# Patient Record
Sex: Male | Born: 1970 | Race: White | Hispanic: No | Marital: Single | Smoking: Former smoker
Health system: Southern US, Community
[De-identification: ages and names within clinical notes are randomized; demographics above are authoritative.]

## PROBLEM LIST (undated history)

## (undated) DIAGNOSIS — K219 Gastro-esophageal reflux disease without esophagitis: Secondary | ICD-10-CM

## (undated) DIAGNOSIS — E785 Hyperlipidemia, unspecified: Secondary | ICD-10-CM

## (undated) DIAGNOSIS — Z974 Presence of external hearing-aid: Secondary | ICD-10-CM

## (undated) DIAGNOSIS — J45909 Unspecified asthma, uncomplicated: Secondary | ICD-10-CM

## (undated) DIAGNOSIS — F319 Bipolar disorder, unspecified: Secondary | ICD-10-CM

## (undated) DIAGNOSIS — G4730 Sleep apnea, unspecified: Secondary | ICD-10-CM

## (undated) DIAGNOSIS — E119 Type 2 diabetes mellitus without complications: Secondary | ICD-10-CM

## (undated) DIAGNOSIS — M4802 Spinal stenosis, cervical region: Secondary | ICD-10-CM

## (undated) DIAGNOSIS — M5030 Other cervical disc degeneration, unspecified cervical region: Secondary | ICD-10-CM

## (undated) DIAGNOSIS — J302 Other seasonal allergic rhinitis: Secondary | ICD-10-CM

## (undated) DIAGNOSIS — R062 Wheezing: Secondary | ICD-10-CM

## (undated) DIAGNOSIS — I10 Essential (primary) hypertension: Secondary | ICD-10-CM

## (undated) DIAGNOSIS — F32A Depression, unspecified: Secondary | ICD-10-CM

## (undated) DIAGNOSIS — R519 Headache, unspecified: Secondary | ICD-10-CM

## (undated) DIAGNOSIS — E669 Obesity, unspecified: Secondary | ICD-10-CM

## (undated) DIAGNOSIS — K3184 Gastroparesis: Secondary | ICD-10-CM

## (undated) DIAGNOSIS — G4700 Insomnia, unspecified: Secondary | ICD-10-CM

## (undated) DIAGNOSIS — G43909 Migraine, unspecified, not intractable, without status migrainosus: Secondary | ICD-10-CM

## (undated) DIAGNOSIS — N529 Male erectile dysfunction, unspecified: Secondary | ICD-10-CM

## (undated) DIAGNOSIS — D649 Anemia, unspecified: Secondary | ICD-10-CM

## (undated) DIAGNOSIS — Z9689 Presence of other specified functional implants: Secondary | ICD-10-CM

## (undated) DIAGNOSIS — G8929 Other chronic pain: Secondary | ICD-10-CM

## (undated) DIAGNOSIS — M5412 Radiculopathy, cervical region: Secondary | ICD-10-CM

## (undated) DIAGNOSIS — K2950 Unspecified chronic gastritis without bleeding: Secondary | ICD-10-CM

## (undated) DIAGNOSIS — Z87891 Personal history of nicotine dependence: Secondary | ICD-10-CM

## (undated) DIAGNOSIS — F329 Major depressive disorder, single episode, unspecified: Secondary | ICD-10-CM

## (undated) DIAGNOSIS — Z860100 Personal history of colon polyps, unspecified: Secondary | ICD-10-CM

## (undated) HISTORY — PX: PERIPHERAL NERVE STIMULATOR: SHX7389

## (undated) HISTORY — PX: COLONOSCOPY W/ BIOPSIES: SHX1374

## (undated) HISTORY — DX: Type 2 diabetes mellitus without complications: E11.9

## (undated) HISTORY — DX: Bipolar disorder, unspecified: F31.9

## (undated) HISTORY — PX: FRACTURE SURGERY: SHX138

## (undated) HISTORY — PX: CHOLECYSTECTOMY: SHX55

## (undated) HISTORY — PX: OTHER SURGICAL HISTORY: SHX169

---
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
## 2007-12-15 ENCOUNTER — Emergency Department: Payer: Self-pay | Admitting: Emergency Medicine

## 2008-01-22 ENCOUNTER — Emergency Department: Payer: Self-pay | Admitting: Emergency Medicine

## 2008-08-10 ENCOUNTER — Emergency Department: Payer: Self-pay | Admitting: Emergency Medicine

## 2008-08-22 ENCOUNTER — Emergency Department: Payer: Self-pay | Admitting: Emergency Medicine

## 2008-09-20 ENCOUNTER — Emergency Department: Payer: Self-pay | Admitting: Emergency Medicine

## 2010-02-06 ENCOUNTER — Emergency Department: Payer: Self-pay

## 2012-09-10 DIAGNOSIS — G8929 Other chronic pain: Secondary | ICD-10-CM | POA: Insufficient documentation

## 2012-11-01 ENCOUNTER — Ambulatory Visit: Payer: Self-pay | Admitting: Orthopedic Surgery

## 2012-11-09 DIAGNOSIS — M2419 Other articular cartilage disorders, other specified site: Secondary | ICD-10-CM | POA: Insufficient documentation

## 2013-03-14 DIAGNOSIS — M25562 Pain in left knee: Secondary | ICD-10-CM | POA: Insufficient documentation

## 2013-03-14 DIAGNOSIS — M25469 Effusion, unspecified knee: Secondary | ICD-10-CM | POA: Insufficient documentation

## 2013-06-13 ENCOUNTER — Emergency Department: Payer: Self-pay | Admitting: Emergency Medicine

## 2013-10-30 DIAGNOSIS — E119 Type 2 diabetes mellitus without complications: Secondary | ICD-10-CM | POA: Insufficient documentation

## 2014-12-23 ENCOUNTER — Emergency Department: Payer: Self-pay | Admitting: Emergency Medicine

## 2015-01-02 DIAGNOSIS — M5416 Radiculopathy, lumbar region: Secondary | ICD-10-CM | POA: Insufficient documentation

## 2015-01-02 DIAGNOSIS — M5136 Other intervertebral disc degeneration, lumbar region: Secondary | ICD-10-CM | POA: Insufficient documentation

## 2015-03-14 DIAGNOSIS — M47816 Spondylosis without myelopathy or radiculopathy, lumbar region: Secondary | ICD-10-CM | POA: Insufficient documentation

## 2015-04-04 DIAGNOSIS — M5126 Other intervertebral disc displacement, lumbar region: Secondary | ICD-10-CM | POA: Insufficient documentation

## 2015-05-01 DIAGNOSIS — N529 Male erectile dysfunction, unspecified: Secondary | ICD-10-CM | POA: Insufficient documentation

## 2016-06-17 ENCOUNTER — Ambulatory Visit: Payer: Medicare Other | Attending: Anesthesiology | Admitting: Anesthesiology

## 2016-06-17 ENCOUNTER — Encounter: Payer: Self-pay | Admitting: Anesthesiology

## 2016-06-17 DIAGNOSIS — Z029 Encounter for administrative examinations, unspecified: Secondary | ICD-10-CM | POA: Diagnosis not present

## 2016-06-17 NOTE — Progress Notes (Unsigned)
Safety precautions to be maintained throughout the outpatient stay will include: orient to surroundings, keep bed in low position, maintain call bell within reach at all times, provide assistance with transfer out of bed and ambulation Primary care Leim FabryBarbara Aldridge, Dr Delf[facility]ins [facility] does injection and procedures, Dr Vear Clockhristopher Gaines- ortho MD- Filing for Workers comp.

## 2016-07-27 ENCOUNTER — Emergency Department: Payer: Medicare Other

## 2016-07-27 ENCOUNTER — Emergency Department
Admission: EM | Admit: 2016-07-27 | Discharge: 2016-07-27 | Disposition: A | Discharge status: Home / Self Care | Payer: Medicare Other | Attending: Emergency Medicine | Admitting: Emergency Medicine

## 2016-07-27 DIAGNOSIS — E119 Type 2 diabetes mellitus without complications: Secondary | ICD-10-CM | POA: Diagnosis not present

## 2016-07-27 DIAGNOSIS — Z7984 Long term (current) use of oral hypoglycemic drugs: Secondary | ICD-10-CM | POA: Insufficient documentation

## 2016-07-27 DIAGNOSIS — R0789 Other chest pain: Secondary | ICD-10-CM

## 2016-07-27 DIAGNOSIS — Z79899 Other long term (current) drug therapy: Secondary | ICD-10-CM | POA: Insufficient documentation

## 2016-07-27 DIAGNOSIS — Z791 Long term (current) use of non-steroidal anti-inflammatories (NSAID): Secondary | ICD-10-CM | POA: Insufficient documentation

## 2016-07-27 DIAGNOSIS — Z7982 Long term (current) use of aspirin: Secondary | ICD-10-CM | POA: Diagnosis not present

## 2016-07-27 DIAGNOSIS — Z87891 Personal history of nicotine dependence: Secondary | ICD-10-CM | POA: Diagnosis not present

## 2016-07-27 LAB — BASIC METABOLIC PANEL
ANION GAP: 6 (ref 5–15)
BUN: 24 mg/dL — ABNORMAL HIGH (ref 6–20)
CHLORIDE: 102 mmol/L (ref 101–111)
CO2: 31 mmol/L (ref 22–32)
CREATININE: 1.26 mg/dL — AB (ref 0.61–1.24)
Calcium: 9.9 mg/dL (ref 8.9–10.3)
GFR calc non Af Amer: >60 mL/min (ref >60)
Glucose, Bld: 130 mg/dL — ABNORMAL HIGH (ref 65–99)
POTASSIUM: 4 mmol/L (ref 3.5–5.1)
SODIUM: 139 mmol/L (ref 135–145)

## 2016-07-27 LAB — CBC
HCT: 41.4 % (ref 40.0–52.0)
HEMOGLOBIN: 14.1 g/dL (ref 13.0–18.0)
MCH: 28.1 pg (ref 26.0–34.0)
MCHC: 34 g/dL (ref 32.0–36.0)
MCV: 82.5 fL (ref 80.0–100.0)
PLATELETS: 234 10*3/uL (ref 150–440)
RBC: 5.02 MIL/uL (ref 4.40–5.90)
RDW: 12.6 % (ref 11.5–14.5)
WBC: 6.9 10*3/uL (ref 3.8–10.6)

## 2016-07-27 LAB — TROPONIN I
Troponin I: <0.03 ng/mL (ref <0.03)
Troponin I: <0.03 ng/mL (ref <0.03)

## 2016-07-27 NOTE — ED Triage Notes (Signed)
Pt c/o left sided chest pain that radiates into the left arm, that started last night, states it eased off and then came back this morning.. Denies SOB, N/V/diaporesis..Daniel [name]

## 2016-07-27 NOTE — ED Notes (Signed)
Pt. Verbalizes understanding of d/c instructions and follow-up. VS stable and pain controlled per pt.  Pt. In NAD at time of d/c and denies further concerns regarding this visit. Pt.Ambulatory Out of the unit with steady gait. Pt advised to return to the ED at any time for emergent concerns, or for new/worsening symptoms.

## 2016-07-27 NOTE — ED Provider Notes (Signed)
[facility] Emergency Department Provider Note ____________________________________________   I have reviewed the triage vital signs and the triage nursing note.  HISTORY  Chief Complaint Chest Pain   Historian Patient  HPI Daniel [name] is a 45 y.o. male with a history of diabetespresents today with 2 episodes of chest pain. The first one woke him up in the middle of the night around 5 AM, and then later around 7 AM when he woke up again. He took his brother to work around 8:30 and came over to the emergency room for evaluation. Pain is sharp and intermittent and over the left chest wall laterally and into the upper left shoulder. No nausea, trouble breathing, shortness of breath, fever, recent illnesses, weakness, numbness, or overuse injury. Had this before. Pain is essentially gone now. Patient denies burping or belching or indigestion symptoms. He does report that he is under some stress. He is a nonsmoker. He does not recall if he has a family history of heart attack, but thinks he may have had a grandmother with heart attacks.    Past Medical History:  Diagnosis Date  . Bipolar 1 disorder (HCC)   . Diabetes mellitus without complication (HCC)     There are no active problems to display for this patient.   Past Surgical History:  Procedure Laterality Date  . CHOLECYSTECTOMY      Prior to Admission medications   Medication Sig Start Date End Date Taking? Authorizing Provider  albuterol (PROVENTIL HFA;VENTOLIN HFA) 108 (90 Base) MCG/ACT inhaler Inhale 2 puffs into the lungs as needed for wheezing or shortness of breath.   Yes Historical Provider, MD  aspirin EC 81 MG tablet Take 81 mg by mouth at bedtime.    Yes Historical Provider, MD  atorvastatin (LIPITOR) 10 MG tablet Take 10 mg by mouth at bedtime.    Yes Historical Provider, MD  divalproex (DEPAKOTE ER) 500 MG 24 hr tablet Take 500 mg by mouth every morning.   Yes Historical Provider, MD   gabapentin (NEURONTIN) 300 MG capsule Take 600 mg by mouth 2 (two) times daily. Take 2 caps by mouth bid    Yes Historical Provider, MD  glucose blood test strip 1 each by Other route at bedtime. Use as instructed    Yes Historical Provider, MD  Melatonin 3 MG TABS Take 3 mg by mouth at bedtime.    Yes Historical Provider, MD  metFORMIN (GLUCOPHAGE-XR) 500 MG 24 hr tablet Take 1,500 mg by mouth every morning.   Yes Historical Provider, MD  naproxen (NAPROSYN) 500 MG tablet Take 500 mg by mouth 2 (two) times daily with a meal.   Yes Historical Provider, MD  traMADol (ULTRAM-ER) 100 MG 24 hr tablet Take 100 mg by mouth at bedtime as needed for pain.   Yes Historical Provider, MD    Allergies  Allergen Reactions  . Doxycycline     Other reaction(s): Vomiting  . Other     Other reaction(s): Unknown Mercaptobenzothiazole  . Parabens     Other reaction(s): Unknown  . Tramadol Itching    Tolerates extended release  . Penicillins Rash    Has patient had a PCN reaction causing immediate rash, facial/tongue/throat swelling, SOB or lightheadedness with hypotension: Yes Has patient had a PCN reaction causing severe rash involving mucus membranes or skin necrosis: No Has patient had a PCN reaction that required hospitalization No Has patient had a PCN reaction occurring within the last 10 years: No If all of  the above answers are "NO", then may proceed with Cephalosporin use.    Family History  Problem Relation Age of Onset  . Diabetes Mother   . Hypertension Mother   . Stroke Father     Social History Social History  Substance Use Topics  . Smoking status: Former Games developer  . Smokeless tobacco: Never Used  . Alcohol use No    Review of Systems  Constitutional: Negative for fever. Eyes: Negative for visual changes. ENT: Negative for sore throat. Cardiovascular:Negative for pleuritic chest pain. Respiratory: Negative for shortness of breath. Gastrointestinal: Negative for abdominal  pain, vomiting and diarrhea. Genitourinary: Negative for dysuria. Musculoskeletal: Negative for back pain. Skin: Negative for rash. Neurological: Negative for headache. 10 point Review of Systems otherwise negative ____________________________________________   PHYSICAL EXAM:  VITAL SIGNS: ED Triage Vitals [07/27/16 0733]  Enc Vitals Group     BP 139/88     Pulse Rate 67     Resp 17     Temp 98.1 F (36.7 C)     Temp Source Oral     SpO2 99 %     Weight 216 lb (98 kg)     Height 5\' 9"  (1.753 m)     Head Circumference      Peak Flow      Pain Score 9     Pain Loc      Pain Edu?      Excl. in GC?      Constitutional: Alert and oriented. Well appearing and in no distress. HEENT   Head: Normocephalic and atraumatic.      Eyes: Conjunctivae are normal. PERRL. Normal extraocular movements.      Ears:         Nose: No congestion/rhinnorhea.   Mouth/Throat: Mucous membranes are moist.   Neck: No stridor. Cardiovascular/Chest: Normal rate, regular rhythm.  No murmurs, rubs, or gallops. Respiratory: Normal respiratory effort without tachypnea nor retractions. Breath sounds are clear and equal bilaterally. No wheezes/rales/rhonchi. Gastrointestinal: Soft. No distention, no guarding, no rebound. Nontender.    Genitourinary/rectal:Deferred Musculoskeletal: Nontender with normal range of motion in all extremities. No joint effusions.  No lower extremity tenderness.  No edema. Neurologic:  Normal speech and language. No gross or focal neurologic deficits are appreciated. Skin:  Skin is warm, dry and intact. No rash noted. Psychiatric: Mood and affect are normal. Speech and behavior are normal. Patient exhibits appropriate insight and judgment.  ____________________________________________   EKG I, Governor Rooks, MD, the attending physician have personally viewed and interpreted all ECGs.  62 bpm. Normal sinus rhythm. Narrow QRS. Q waves inferiorly Normal axis. Normal ST  and T-wave. ____________________________________________  LABS (pertinent positives/negatives)  Labs Reviewed  BASIC METABOLIC PANEL - Abnormal; Notable for the following:       Result Value   Glucose, Bld 130 (*)    BUN 24 (*)    Creatinine, Ser 1.26 (*)    All other components within normal limits  CBC  TROPONIN I  TROPONIN I    ____________________________________________  RADIOLOGY All Xrays were viewed by me. Imaging interpreted by Radiologist.  Chest two-view: Calcified granuloma right upper lobe. No edema or consolidation. __________________________________________  PROCEDURES  Procedure(s) performed: None  Critical Care performed: None  ____________________________________________   ED COURSE / ASSESSMENT AND PLAN  Pertinent labs & imaging results that were available during my care of the patient were reviewed by me and considered in my medical decision making (see chart for details).   Mr. Desmith  is here with an atypical chest discomfort, 2 episodes this morning, but essentially resolved now. His EKG is reassuring. His troponin and repeat troponin are reassuring.  Uncertain etiology, but I suspect stress may be contributing. Patient will be referred for outpatient close cardiac follow-up.  Patient understands, to return for any worsening or associated conditions. He will call the cardiologist to make an appointment.    CONSULTATIONS:   None   Patient / Family / Caregiver informed of clinical course, medical decision-making process, and agree with plan.   I discussed return precautions, follow-up instructions, and discharged instructions with patient and/or family.   ___________________________________________   FINAL CLINICAL IMPRESSION(S) / ED DIAGNOSES   Final diagnoses:  Atypical chest pain              Note: This dictation was prepared with Dragon dictation. Any transcriptional errors that result from this process are  unintentional    Governor Rooksebecca Monterrio Gerst, MD 07/27/16 [phone]

## 2016-07-27 NOTE — Discharge Instructions (Signed)
You were evaluated for chest pain, and although no certain cause was found, your exam and evaluation are reassuring in the emergency department stay.  Return to the emergency department for any worsening chest pain, or certainly any associated trouble breathing, fever, weakness or numbness, dizziness or passing out, or any other symptoms concerning to you.

## 2016-07-27 NOTE — ED Notes (Signed)
Pt. Remains consistent with 9/10 chest pain upon my taking this pt. Assignment. WNL, pt. Laying in bed watching tv and on his phone with NAD noted. Pt. Updated on plan of care and instructed to please call out immediately for any change in symptoms or further needs.

## 2016-10-03 ENCOUNTER — Encounter: Payer: Self-pay | Admitting: Emergency Medicine

## 2016-10-03 ENCOUNTER — Emergency Department
Admission: EM | Admit: 2016-10-03 | Discharge: 2016-10-04 | Disposition: A | Discharge status: Home / Self Care | Payer: Medicare Other | Attending: Emergency Medicine | Admitting: Emergency Medicine

## 2016-10-03 DIAGNOSIS — Z7982 Long term (current) use of aspirin: Secondary | ICD-10-CM | POA: Insufficient documentation

## 2016-10-03 DIAGNOSIS — K529 Noninfective gastroenteritis and colitis, unspecified: Secondary | ICD-10-CM | POA: Diagnosis not present

## 2016-10-03 DIAGNOSIS — Z791 Long term (current) use of non-steroidal anti-inflammatories (NSAID): Secondary | ICD-10-CM | POA: Insufficient documentation

## 2016-10-03 DIAGNOSIS — E119 Type 2 diabetes mellitus without complications: Secondary | ICD-10-CM | POA: Insufficient documentation

## 2016-10-03 DIAGNOSIS — Z87891 Personal history of nicotine dependence: Secondary | ICD-10-CM | POA: Insufficient documentation

## 2016-10-03 DIAGNOSIS — Z79899 Other long term (current) drug therapy: Secondary | ICD-10-CM | POA: Insufficient documentation

## 2016-10-03 DIAGNOSIS — K047 Periapical abscess without sinus: Secondary | ICD-10-CM

## 2016-10-03 DIAGNOSIS — Z7984 Long term (current) use of oral hypoglycemic drugs: Secondary | ICD-10-CM | POA: Insufficient documentation

## 2016-10-03 DIAGNOSIS — R112 Nausea with vomiting, unspecified: Secondary | ICD-10-CM | POA: Diagnosis present

## 2016-10-03 LAB — BASIC METABOLIC PANEL
ANION GAP: 8 (ref 5–15)
BUN: 10 mg/dL (ref 6–20)
CALCIUM: 8.9 mg/dL (ref 8.9–10.3)
CO2: 30 mmol/L (ref 22–32)
CREATININE: 0.93 mg/dL (ref 0.61–1.24)
Chloride: 98 mmol/L — ABNORMAL LOW (ref 101–111)
GFR calc Af Amer: >60 mL/min (ref >60)
GLUCOSE: 161 mg/dL — AB (ref 65–99)
Potassium: 4.1 mmol/L (ref 3.5–5.1)
Sodium: 136 mmol/L (ref 135–145)

## 2016-10-03 LAB — CBC WITH DIFFERENTIAL/PLATELET
Basophils Absolute: 0 10*3/uL (ref 0–0.1)
Basophils Relative: 0 %
EOS PCT: 5 %
Eosinophils Absolute: 0.4 10*3/uL (ref 0–0.7)
HEMATOCRIT: 43.7 % (ref 40.0–52.0)
Hemoglobin: 14.4 g/dL (ref 13.0–18.0)
LYMPHS PCT: 18 %
Lymphs Abs: 1.5 10*3/uL (ref 1.0–3.6)
MCH: 27.5 pg (ref 26.0–34.0)
MCHC: 33.1 g/dL (ref 32.0–36.0)
MCV: 83.3 fL (ref 80.0–100.0)
MONO ABS: 0.8 10*3/uL (ref 0.2–1.0)
MONOS PCT: 10 %
NEUTROS ABS: 5.5 10*3/uL (ref 1.4–6.5)
Neutrophils Relative %: 67 %
PLATELETS: 243 10*3/uL (ref 150–440)
RBC: 5.25 MIL/uL (ref 4.40–5.90)
RDW: 13 % (ref 11.5–14.5)
WBC: 8.3 10*3/uL (ref 3.8–10.6)

## 2016-10-03 MED ORDER — BUPIVACAINE HCL (PF) 0.5 % IJ SOLN
10.0000 mL | Freq: Once | INTRAMUSCULAR | Status: AC
  Administered 2016-10-03: 10 mL
  Filled 2016-10-03: qty 30

## 2016-10-03 MED ORDER — ONDANSETRON HCL 4 MG/2ML IJ SOLN: 4.0000 mg | Freq: Once | INTRAMUSCULAR | Status: DC

## 2016-10-03 MED ORDER — ONDANSETRON HCL 4 MG PO TABS
4.0000 mg | ORAL_TABLET | Freq: Once | ORAL | Status: AC
  Administered 2016-10-03: 4 mg via ORAL
  Filled 2016-10-03: qty 1

## 2016-10-03 NOTE — ED Triage Notes (Signed)
Pt presents to ED with worsening abscessed tooth. Pt states he was seen by his dentist earlier in the week and dx with abscessed tooth and prescribed antibiotics. Pt reports increase in pain and swelling today. Called dentist and increased antibiotic dose. Pt now reports vomiting since around 2000 and radiating pain into the back of his neck.

## 2016-10-03 NOTE — ED Provider Notes (Signed)
[facility] Emergency Department Provider Note   ____________________________________________   I have reviewed the triage vital signs and the nursing notes.   HISTORY  Chief Complaint Oral Swelling and Emesis   History limited by: Not Limited   HPI Daniel [name] is a 45 y.o. male who presents to the emergency department today because of concern for continued left sided facial swelling and pain as well as nausea and vomiting. The patient states that he started having symptoms roughly 2 weeks ago. Was put on clindamycin 2 times a day. Went to urgent care today and they upped him to 4 times a day. He states that since leaving urgent care this morning he started developing nausea, vomiting and diarrhea. He denies any associated abdominal pain. States that he has not contacted his dentist about this problem yet.    Past Medical History:  Diagnosis Date  . Bipolar 1 disorder (HCC)   . Diabetes mellitus without complication (HCC)     There are no active problems to display for this patient.   Past Surgical History:  Procedure Laterality Date  . CHOLECYSTECTOMY      Prior to Admission medications   Medication Sig Start Date End Date Taking? Authorizing Provider  albuterol (PROVENTIL HFA;VENTOLIN HFA) 108 (90 Base) MCG/ACT inhaler Inhale 2 puffs into the lungs as needed for wheezing or shortness of breath.    Historical Provider, MD  aspirin EC 81 MG tablet Take 81 mg by mouth at bedtime.     Historical Provider, MD  atorvastatin (LIPITOR) 10 MG tablet Take 10 mg by mouth at bedtime.     Historical Provider, MD  divalproex (DEPAKOTE ER) 500 MG 24 hr tablet Take 500 mg by mouth every morning.    Historical Provider, MD  gabapentin (NEURONTIN) 300 MG capsule Take 600 mg by mouth 2 (two) times daily. Take 2 caps by mouth bid     Historical Provider, MD  glucose blood test strip 1 each by Other route at bedtime. Use as instructed     Historical Provider, MD   Melatonin 3 MG TABS Take 3 mg by mouth at bedtime.     Historical Provider, MD  metFORMIN (GLUCOPHAGE-XR) 500 MG 24 hr tablet Take 1,500 mg by mouth every morning.    Historical Provider, MD  naproxen (NAPROSYN) 500 MG tablet Take 500 mg by mouth 2 (two) times daily with a meal.    Historical Provider, MD  traMADol (ULTRAM-ER) 100 MG 24 hr tablet Take 100 mg by mouth at bedtime as needed for pain.    Historical Provider, MD    Allergies Doxycycline; Other; Parabens; Tramadol; and Penicillins  Family History  Problem Relation Age of Onset  . Diabetes Mother   . Hypertension Mother   . Stroke Father     Social History Social History  Substance Use Topics  . Smoking status: Former Games developermoker  . Smokeless tobacco: Never Used  . Alcohol use No    Review of Systems  Constitutional: Positive for fever. Cardiovascular: Negative for chest pain. Respiratory: Negative for shortness of breath. Gastrointestinal: Negative for abdominal pain, vomiting and diarrhea. Genitourinary: Negative for dysuria. Musculoskeletal: Negative for back pain. Skin: Negative for rash. Neurological: Negative for headaches, focal weakness or numbness.  10-point ROS otherwise negative.  ____________________________________________   PHYSICAL EXAM:  VITAL SIGNS: ED Triage Vitals [10/03/16 2200]  Enc Vitals Group     BP (!) 147/102     Pulse Rate 97     Resp  20     Temp 99.5 F (37.5 C)     Temp Source Oral     SpO2 97 %     Weight 216 lb (98 kg)     Height 5\' 9"  (1.753 m)     Head Circumference      Peak Flow      Pain Score 10   Constitutional: Alert and oriented. Well appearing and in no distress. Eyes: Conjunctivae are normal. Normal extraocular movements. ENT   Head: Normocephalic and atraumatic.   Nose: No congestion/rhinnorhea.   Mouth/Throat: Mucous membranes are moist. Some swelling around the left mandibular angle with some tenderness.    Neck: No  stridor. Hematological/Lymphatic/Immunilogical: No cervical lymphadenopathy. Cardiovascular: Normal rate, regular rhythm.  No murmurs, rubs, or gallops.  Respiratory: Normal respiratory effort without tachypnea nor retractions. Breath sounds are clear and equal bilaterally. No wheezes/rales/rhonchi. Gastrointestinal: Soft and nontender. No distention.  Genitourinary: Deferred Musculoskeletal: Normal range of motion in all extremities. No lower extremity edema. Neurologic:  Normal speech and language. No gross focal neurologic deficits are appreciated.  Skin:  Skin is warm, dry and intact. No rash noted. Psychiatric: Mood and affect are normal. Speech and behavior are normal. Patient exhibits appropriate insight and judgment.  ____________________________________________    LABS (pertinent positives/negatives)  Labs Reviewed  BASIC METABOLIC PANEL - Abnormal; Notable for the following:       Result Value   Chloride 98 (*)    Glucose, Bld 161 (*)    All other components within normal limits  CBC WITH DIFFERENTIAL/PLATELET    ____________________________________________   EKG  None  ____________________________________________    RADIOLOGY  None  ____________________________________________   PROCEDURES  Procedures  NERVE BLOCK Performed by: Phineas Semen Consent: Verbal consent obtained. Required items: required blood products, implants, devices, and special equipment available  Indication: Pain Nerve block body site: Left lower molar  Needle gauge: 25 G Location technique: anatomical landmarks  Local anesthetic: 0.5% bupivacaine  Anesthetic total: 1 ml  Outcome: pain improved Patient tolerance: Patient tolerated the procedure well with no immediate complications.  ____________________________________________   INITIAL IMPRESSION / ASSESSMENT AND PLAN / ED COURSE  Pertinent labs & imaging results that were available during my care of the patient  were reviewed by me and considered in my medical decision making (see chart for details).  Patient with continued left lower tooth pain as well as vomiting and diarrhea. Think patient continues to have dental infection, think had been under treated with abx, was switched to higher dose earlier. Performed dental block which patient tolerated well and stated helped his pain. In terms of N/V/D think likely gastroenteritis, no abdominal pain and exam is benign. Blood work without any concerning findings.  ____________________________________________   FINAL CLINICAL IMPRESSION(S) / ED DIAGNOSES  Final diagnoses:  Dental infection  Gastroenteritis     Note: This dictation was prepared with Dragon dictation. Any transcriptional errors that result from this process are unintentional    Phineas Semen, MD 10/04/16 0006

## 2016-10-04 MED ORDER — ONDANSETRON HCL 4 MG PO TABS: 4.0000 mg | ORAL_TABLET | Freq: Three times a day (TID) | ORAL | 0 refills | Status: DC | PRN

## 2016-10-04 NOTE — Discharge Instructions (Signed)
Please seek medical attention for any high fevers, chest pain, shortness of breath, change in behavior, persistent vomiting, bloody stool or any other new or concerning symptoms.  

## 2017-05-06 ENCOUNTER — Encounter: Payer: Self-pay | Admitting: Anesthesiology

## 2017-08-11 ENCOUNTER — Other Ambulatory Visit: Payer: Self-pay | Admitting: Orthopedic Surgery

## 2017-08-11 DIAGNOSIS — M7551 Bursitis of right shoulder: Secondary | ICD-10-CM

## 2017-08-11 DIAGNOSIS — M25311 Other instability, right shoulder: Secondary | ICD-10-CM

## 2017-08-21 ENCOUNTER — Ambulatory Visit
Admission: RE | Admit: 2017-08-21 | Discharge: 2017-08-21 | Disposition: A | Discharge status: Home / Self Care | Payer: Medicare HMO | Source: Ambulatory Visit | Attending: Orthopedic Surgery | Admitting: Orthopedic Surgery

## 2017-08-21 DIAGNOSIS — M25311 Other instability, right shoulder: Secondary | ICD-10-CM | POA: Insufficient documentation

## 2017-08-21 DIAGNOSIS — R937 Abnormal findings on diagnostic imaging of other parts of musculoskeletal system: Secondary | ICD-10-CM | POA: Insufficient documentation

## 2017-08-21 DIAGNOSIS — M7551 Bursitis of right shoulder: Secondary | ICD-10-CM | POA: Diagnosis present

## 2017-09-09 DIAGNOSIS — M19011 Primary osteoarthritis, right shoulder: Secondary | ICD-10-CM | POA: Insufficient documentation

## 2017-09-09 DIAGNOSIS — M7581 Other shoulder lesions, right shoulder: Secondary | ICD-10-CM | POA: Insufficient documentation

## 2017-09-23 DIAGNOSIS — L219 Seborrheic dermatitis, unspecified: Secondary | ICD-10-CM | POA: Insufficient documentation

## 2017-10-05 ENCOUNTER — Encounter
Admission: RE | Admit: 2017-10-05 | Discharge: 2017-10-05 | Disposition: A | Discharge status: Home / Self Care | Payer: Medicare HMO | Source: Ambulatory Visit | Attending: Surgery | Admitting: Surgery

## 2017-10-05 DIAGNOSIS — Z01810 Encounter for preprocedural cardiovascular examination: Secondary | ICD-10-CM | POA: Diagnosis not present

## 2017-10-05 DIAGNOSIS — R9431 Abnormal electrocardiogram [ECG] [EKG]: Secondary | ICD-10-CM | POA: Diagnosis not present

## 2017-10-05 DIAGNOSIS — Z01812 Encounter for preprocedural laboratory examination: Secondary | ICD-10-CM | POA: Insufficient documentation

## 2017-10-05 HISTORY — DX: Wheezing: R06.2

## 2017-10-05 HISTORY — DX: Other seasonal allergic rhinitis: J30.2

## 2017-10-05 HISTORY — DX: Hyperlipidemia, unspecified: E78.5

## 2017-10-05 LAB — BASIC METABOLIC PANEL
ANION GAP: 6 (ref 5–15)
BUN: 16 mg/dL (ref 6–20)
CO2: 27 mmol/L (ref 22–32)
Calcium: 9.3 mg/dL (ref 8.9–10.3)
Chloride: 103 mmol/L (ref 101–111)
Creatinine, Ser: 0.92 mg/dL (ref 0.61–1.24)
GLUCOSE: 113 mg/dL — AB (ref 65–99)
POTASSIUM: 3.9 mmol/L (ref 3.5–5.1)
Sodium: 136 mmol/L (ref 135–145)

## 2017-10-05 LAB — CBC
HEMATOCRIT: 40.8 % (ref 40.0–52.0)
Hemoglobin: 13.5 g/dL (ref 13.0–18.0)
MCH: 27.3 pg (ref 26.0–34.0)
MCHC: 33.2 g/dL (ref 32.0–36.0)
MCV: 82.4 fL (ref 80.0–100.0)
Platelets: 245 10*3/uL (ref 150–440)
RBC: 4.95 MIL/uL (ref 4.40–5.90)
RDW: 12.7 % (ref 11.5–14.5)
WBC: 5.6 10*3/uL (ref 3.8–10.6)

## 2017-10-05 NOTE — Patient Instructions (Addendum)
Your procedure is scheduled on: 10/13/17 Tues Report to Same Day Surgery 2nd floor medical mall [facility](Medical Mall Entrance-take elevator on left to 2nd floor.  Check in with surgery information desk.) To find out your arrival time please call [phone] between 1PM - 3PM on 10/12/17 mon  Remember: Instructions that are not followed completely may result in serious medical risk, up to and including death, or upon the discretion of your surgeon and anesthesiologist your surgery may need to be rescheduled.    _x___ 1. Do not eat food after midnight the night before your procedure. You may drink clear liquids up to 2 hours before you are scheduled to arrive at the hospital for your procedure.  Do not drink clear liquids within 2 hours of your scheduled arrival to the hospital.  Clear liquids include  --Water or Apple juice without pulp  --Clear carbohydrate beverage such as ClearFast or Gatorade  --Black Coffee or Clear Tea (No milk, no creamers, do not add anything to                  the coffee or Tea Type 1 and type 2 diabetics should only drink water.  No gum chewing or hard candies.     __x__ 2. No Alcohol for 24 hours before or after surgery.   __x__3. No Smoking for 24 prior to surgery.   ____  4. Bring all medications with you on the day of surgery if instructed.    __x__ 5. Notify your doctor if there is any change in your medical condition     (cold, fever, infections).     Do not wear jewelry, make-up, hairpins, clips or nail polish.  Do not wear lotions, powders, or perfumes. You may wear deodorant.  Do not shave 48 hours prior to surgery. Men may shave face and neck.  Do not bring valuables to the hospital.    [facility] is not [facility] for any belongings or valuables.               Contacts, dentures or bridgework may not be worn into surgery.  Leave your suitcase in the car. After surgery it may be brought to your room.  For patients admitted to the hospital, discharge  time is determined by your                       treatment team.   Patients discharged the day of surgery will not be allowed to drive home.  You will need someone to drive you home and stay with you the night of your procedure.    Please read over the following fact sheets that you were given:   [facility] Preparing for Surgery and or MRSA Information   _x___ Take anti-hypertensive listed below, cardiac, seizure, asthma,     anti-reflux and psychiatric medicines. These include:  1. divalproex (DEPAKOTE ER) 500 MG 24 hr tablet  2.gabapentin (NEURONTIN) 300 MG capsule  3.  4.  5.  6.  ____Fleets enema or Magnesium Citrate as directed.   _x___ Use CHG Soap or sage wipes as directed on instruction sheet   ____ Use inhalers on the day of surgery and bring to hospital day of surgery  _x___ Stop Metformin and Janumet 2 days prior to surgery.   x ____ Take 1/2 of usual insulin dose the night before surgery and none on the morning     surgery.   _x___ Follow recommendations from Cardiologist, Pulmonologist  or PCP regarding          stopping Aspirin, Coumadin, Plavix ,Eliquis, Effient, or Pradaxa, and Pletal.  X____Stop Anti-inflammatories such as Advil, Aleve, Ibuprofen, Motrin, Naproxen, Naprosyn, Goodies powders or aspirin products. OK to take Tylenol and                          Celebrex.   _x___ Stop supplements until after surgery.  But may continue Vitamin D, Vitamin B,       and multivitamin.   ____ Bring C-Pap to the hospital.

## 2017-10-12 MED ORDER — CLINDAMYCIN PHOSPHATE 900 MG/50ML IV SOLN
900.0000 mg | Freq: Once | INTRAVENOUS | Status: AC
  Administered 2017-10-13: 900 mg via INTRAVENOUS

## 2017-10-13 ENCOUNTER — Ambulatory Visit: Payer: Medicare HMO | Admitting: Anesthesiology

## 2017-10-13 ENCOUNTER — Encounter
Admission: RE | Disposition: A | Discharge status: Home / Self Care | Payer: Self-pay | Source: Ambulatory Visit | Attending: Surgery

## 2017-10-13 ENCOUNTER — Ambulatory Visit
Admission: RE | Admit: 2017-10-13 | Discharge: 2017-10-13 | Disposition: A | Discharge status: Home / Self Care | Payer: Medicare HMO | Source: Ambulatory Visit | Attending: Surgery | Admitting: Surgery

## 2017-10-13 ENCOUNTER — Encounter: Payer: Self-pay | Admitting: *Deleted

## 2017-10-13 DIAGNOSIS — G4700 Insomnia, unspecified: Secondary | ICD-10-CM | POA: Insufficient documentation

## 2017-10-13 DIAGNOSIS — Z833 Family history of diabetes mellitus: Secondary | ICD-10-CM | POA: Insufficient documentation

## 2017-10-13 DIAGNOSIS — Z8042 Family history of malignant neoplasm of prostate: Secondary | ICD-10-CM | POA: Diagnosis not present

## 2017-10-13 DIAGNOSIS — E119 Type 2 diabetes mellitus without complications: Secondary | ICD-10-CM | POA: Diagnosis not present

## 2017-10-13 DIAGNOSIS — Z7984 Long term (current) use of oral hypoglycemic drugs: Secondary | ICD-10-CM | POA: Insufficient documentation

## 2017-10-13 DIAGNOSIS — Z880 Allergy status to penicillin: Secondary | ICD-10-CM | POA: Insufficient documentation

## 2017-10-13 DIAGNOSIS — Z888 Allergy status to other drugs, medicaments and biological substances status: Secondary | ICD-10-CM | POA: Insufficient documentation

## 2017-10-13 DIAGNOSIS — Z87891 Personal history of nicotine dependence: Secondary | ICD-10-CM | POA: Insufficient documentation

## 2017-10-13 DIAGNOSIS — Z823 Family history of stroke: Secondary | ICD-10-CM | POA: Diagnosis not present

## 2017-10-13 DIAGNOSIS — Z820 Family history of epilepsy and other diseases of the nervous system: Secondary | ICD-10-CM | POA: Diagnosis not present

## 2017-10-13 DIAGNOSIS — Z881 Allergy status to other antibiotic agents status: Secondary | ICD-10-CM | POA: Insufficient documentation

## 2017-10-13 DIAGNOSIS — E785 Hyperlipidemia, unspecified: Secondary | ICD-10-CM | POA: Insufficient documentation

## 2017-10-13 DIAGNOSIS — M549 Dorsalgia, unspecified: Secondary | ICD-10-CM | POA: Insufficient documentation

## 2017-10-13 DIAGNOSIS — G8929 Other chronic pain: Secondary | ICD-10-CM | POA: Insufficient documentation

## 2017-10-13 DIAGNOSIS — Z8262 Family history of osteoporosis: Secondary | ICD-10-CM | POA: Insufficient documentation

## 2017-10-13 DIAGNOSIS — M7581 Other shoulder lesions, right shoulder: Secondary | ICD-10-CM | POA: Diagnosis not present

## 2017-10-13 DIAGNOSIS — Z8249 Family history of ischemic heart disease and other diseases of the circulatory system: Secondary | ICD-10-CM | POA: Diagnosis not present

## 2017-10-13 DIAGNOSIS — Z8349 Family history of other endocrine, nutritional and metabolic diseases: Secondary | ICD-10-CM | POA: Diagnosis not present

## 2017-10-13 DIAGNOSIS — Z9049 Acquired absence of other specified parts of digestive tract: Secondary | ICD-10-CM | POA: Insufficient documentation

## 2017-10-13 DIAGNOSIS — M75111 Incomplete rotator cuff tear or rupture of right shoulder, not specified as traumatic: Secondary | ICD-10-CM | POA: Insufficient documentation

## 2017-10-13 DIAGNOSIS — Z808 Family history of malignant neoplasm of other organs or systems: Secondary | ICD-10-CM | POA: Diagnosis not present

## 2017-10-13 DIAGNOSIS — Z79899 Other long term (current) drug therapy: Secondary | ICD-10-CM | POA: Insufficient documentation

## 2017-10-13 DIAGNOSIS — M5116 Intervertebral disc disorders with radiculopathy, lumbar region: Secondary | ICD-10-CM | POA: Diagnosis not present

## 2017-10-13 DIAGNOSIS — M19011 Primary osteoarthritis, right shoulder: Secondary | ICD-10-CM | POA: Insufficient documentation

## 2017-10-13 DIAGNOSIS — F3181 Bipolar II disorder: Secondary | ICD-10-CM | POA: Insufficient documentation

## 2017-10-13 DIAGNOSIS — K219 Gastro-esophageal reflux disease without esophagitis: Secondary | ICD-10-CM | POA: Diagnosis not present

## 2017-10-13 DIAGNOSIS — Z818 Family history of other mental and behavioral disorders: Secondary | ICD-10-CM | POA: Diagnosis not present

## 2017-10-13 DIAGNOSIS — J45909 Unspecified asthma, uncomplicated: Secondary | ICD-10-CM | POA: Insufficient documentation

## 2017-10-13 DIAGNOSIS — M7541 Impingement syndrome of right shoulder: Secondary | ICD-10-CM | POA: Diagnosis not present

## 2017-10-13 HISTORY — PX: SHOULDER ARTHROSCOPY WITH OPEN ROTATOR CUFF REPAIR: SHX6092

## 2017-10-13 HISTORY — PX: SHOULDER ARTHROSCOPY WITH DISTAL CLAVICLE RESECTION: SHX5675

## 2017-10-13 LAB — GLUCOSE, CAPILLARY: GLUCOSE-CAPILLARY: 118 mg/dL — AB (ref 65–99)

## 2017-10-13 SURGERY — ARTHROSCOPY, SHOULDER WITH REPAIR, ROTATOR CUFF, OPEN
Anesthesia: General | Site: Shoulder | Laterality: Right | Wound class: Clean

## 2017-10-13 MED ORDER — ROCURONIUM BROMIDE 50 MG/5ML IV SOLN
INTRAVENOUS | Status: AC
  Filled 2017-10-13: qty 1

## 2017-10-13 MED ORDER — MIDAZOLAM HCL 2 MG/2ML IJ SOLN
0.5000 mg | Freq: Once | INTRAMUSCULAR | Status: AC
  Administered 2017-10-13: 0.5 mg via INTRAVENOUS

## 2017-10-13 MED ORDER — ONDANSETRON HCL 4 MG/2ML IJ SOLN
INTRAMUSCULAR | Status: DC | PRN
  Administered 2017-10-13: 4 mg via INTRAVENOUS

## 2017-10-13 MED ORDER — SODIUM CHLORIDE 0.9 % IV SOLN
INTRAVENOUS | Status: DC
  Administered 2017-10-13: 12:00:00 via INTRAVENOUS

## 2017-10-13 MED ORDER — FENTANYL CITRATE (PF) 100 MCG/2ML IJ SOLN
50.0000 ug | Freq: Once | INTRAMUSCULAR | Status: AC
  Administered 2017-10-13: 50 ug via INTRAVENOUS

## 2017-10-13 MED ORDER — LIDOCAINE HCL (PF) 2 % IJ SOLN
INTRAMUSCULAR | Status: AC
  Filled 2017-10-13: qty 10

## 2017-10-13 MED ORDER — SUGAMMADEX SODIUM 200 MG/2ML IV SOLN
INTRAVENOUS | Status: AC
  Filled 2017-10-13: qty 2

## 2017-10-13 MED ORDER — SUGAMMADEX SODIUM 200 MG/2ML IV SOLN
INTRAVENOUS | Status: DC | PRN
  Administered 2017-10-13: 200 mg via INTRAVENOUS

## 2017-10-13 MED ORDER — ROPIVACAINE HCL 5 MG/ML IJ SOLN
INTRAMUSCULAR | Status: DC | PRN
  Administered 2017-10-13: 20 mL via PERINEURAL
  Administered 2017-10-13: 10 mL via PERINEURAL

## 2017-10-13 MED ORDER — FENTANYL CITRATE (PF) 100 MCG/2ML IJ SOLN
INTRAMUSCULAR | Status: DC | PRN
  Administered 2017-10-13: 150 ug via INTRAVENOUS

## 2017-10-13 MED ORDER — IPRATROPIUM-ALBUTEROL 0.5-2.5 (3) MG/3ML IN SOLN
RESPIRATORY_TRACT | Status: AC
  Administered 2017-10-13: 3 mL
  Filled 2017-10-13: qty 3

## 2017-10-13 MED ORDER — IPRATROPIUM-ALBUTEROL 0.5-2.5 (3) MG/3ML IN SOLN: 3.0000 mL | RESPIRATORY_TRACT | Status: DC

## 2017-10-13 MED ORDER — PROPOFOL 10 MG/ML IV BOLUS
INTRAVENOUS | Status: DC | PRN
  Administered 2017-10-13: 180 mg via INTRAVENOUS

## 2017-10-13 MED ORDER — ONDANSETRON HCL 4 MG/2ML IJ SOLN
INTRAMUSCULAR | Status: AC
  Filled 2017-10-13: qty 2

## 2017-10-13 MED ORDER — PROPOFOL 10 MG/ML IV BOLUS
INTRAVENOUS | Status: AC
  Filled 2017-10-13: qty 20

## 2017-10-13 MED ORDER — DEXAMETHASONE SODIUM PHOSPHATE 4 MG/ML IJ SOLN
INTRAMUSCULAR | Status: DC | PRN
  Administered 2017-10-13: 5 mg via INTRAVENOUS

## 2017-10-13 MED ORDER — FENTANYL CITRATE (PF) 250 MCG/5ML IJ SOLN
INTRAMUSCULAR | Status: AC
  Filled 2017-10-13: qty 5

## 2017-10-13 MED ORDER — OXYCODONE HCL 5 MG PO TABS: 5.0000 mg | ORAL_TABLET | ORAL | 0 refills | Status: DC | PRN

## 2017-10-13 MED ORDER — ACETAMINOPHEN 10 MG/ML IV SOLN
INTRAVENOUS | Status: AC
  Filled 2017-10-13: qty 100

## 2017-10-13 MED ORDER — MIDAZOLAM HCL 2 MG/2ML IJ SOLN: 1.0000 mg | Freq: Once | INTRAMUSCULAR | Status: DC

## 2017-10-13 MED ORDER — BUPIVACAINE-EPINEPHRINE 0.5% -1:200000 IJ SOLN
INTRAMUSCULAR | Status: DC | PRN
  Administered 2017-10-13: 30 mL

## 2017-10-13 MED ORDER — FAMOTIDINE 20 MG PO TABS
20.0000 mg | ORAL_TABLET | Freq: Once | ORAL | Status: AC
  Administered 2017-10-13: 20 mg via ORAL

## 2017-10-13 MED ORDER — OXYCODONE HCL 5 MG PO TABS: 5.0000 mg | ORAL_TABLET | Freq: Once | ORAL | Status: DC | PRN

## 2017-10-13 MED ORDER — ROCURONIUM BROMIDE 100 MG/10ML IV SOLN
INTRAVENOUS | Status: DC | PRN
  Administered 2017-10-13: 50 mg via INTRAVENOUS
  Administered 2017-10-13: 10 mg via INTRAVENOUS

## 2017-10-13 MED ORDER — LACTATED RINGERS IV SOLN
INTRAVENOUS | Status: DC | PRN
  Administered 2017-10-13: 2 mL

## 2017-10-13 MED ORDER — LIDOCAINE HCL (PF) 1 % IJ SOLN
INTRAMUSCULAR | Status: DC | PRN
  Administered 2017-10-13: 1 mL via INTRADERMAL

## 2017-10-13 MED ORDER — ACETAMINOPHEN 10 MG/ML IV SOLN
INTRAVENOUS | Status: DC | PRN
  Administered 2017-10-13: 1000 mg via INTRAVENOUS

## 2017-10-13 MED ORDER — FENTANYL CITRATE (PF) 100 MCG/2ML IJ SOLN: 25.0000 ug | INTRAMUSCULAR | Status: DC | PRN

## 2017-10-13 MED ORDER — LIDOCAINE HCL (CARDIAC) 20 MG/ML IV SOLN
INTRAVENOUS | Status: DC | PRN
  Administered 2017-10-13: 100 mg via INTRAVENOUS

## 2017-10-13 MED ORDER — PHENYLEPHRINE HCL 10 MG/ML IJ SOLN
INTRAVENOUS | Status: DC | PRN
  Administered 2017-10-13: 20 ug/min via INTRAVENOUS

## 2017-10-13 MED ORDER — OXYCODONE HCL 5 MG/5ML PO SOLN: 5.0000 mg | Freq: Once | ORAL | Status: DC | PRN

## 2017-10-13 SURGICAL SUPPLY — 47 items
ANCHOR JUGGERKNOT WTAP NDL 2.9 (Anchor) ×3 IMPLANT
ANCHOR SUT W/ ORTHOCORD (Anchor) ×3 IMPLANT
BIT DRILL JUGRKNT W/NDL BIT2.9 (DRILL) ×1 IMPLANT
BLADE FULL RADIUS 3.5 (BLADE) ×3 IMPLANT
BUR ACROMIONIZER 4.0 (BURR) IMPLANT
CANNULA SHAVER 8MMX76MM (CANNULA) ×3 IMPLANT
CHLORAPREP W/TINT 26ML (MISCELLANEOUS) ×3 IMPLANT
COVER MAYO STAND STRL (DRAPES) ×3 IMPLANT
DRAPE IMP U-DRAPE 54X76 (DRAPES) ×6 IMPLANT
DRILL JUGGERKNOT W/NDL BIT 2.9 (DRILL) ×3
DRSG OPSITE POSTOP 4X6 (GAUZE/BANDAGES/DRESSINGS) ×3 IMPLANT
DRSG OPSITE POSTOP 4X8 (GAUZE/BANDAGES/DRESSINGS) ×3 IMPLANT
ELECT REM PT RETURN 9FT ADLT (ELECTROSURGICAL) ×3
ELECTRODE REM PT RTRN 9FT ADLT (ELECTROSURGICAL) ×1 IMPLANT
GAUZE PETRO XEROFOAM 1X8 (MISCELLANEOUS) IMPLANT
GAUZE SPONGE 4X4 12PLY STRL (GAUZE/BANDAGES/DRESSINGS) IMPLANT
GLOVE BIO SURGEON STRL SZ7.5 (GLOVE) ×6 IMPLANT
GLOVE BIO SURGEON STRL SZ8 (GLOVE) ×6 IMPLANT
GLOVE BIOGEL PI IND STRL 8 (GLOVE) ×2 IMPLANT
GLOVE BIOGEL PI INDICATOR 8 (GLOVE) ×4
GLOVE INDICATOR 8.0 STRL GRN (GLOVE) ×3 IMPLANT
GOWN STRL REUS W/ TWL LRG LVL3 (GOWN DISPOSABLE) ×1 IMPLANT
GOWN STRL REUS W/ TWL XL LVL3 (GOWN DISPOSABLE) ×1 IMPLANT
GOWN STRL REUS W/TWL LRG LVL3 (GOWN DISPOSABLE) ×2
GOWN STRL REUS W/TWL XL LVL3 (GOWN DISPOSABLE) ×2
GRASPER SUT 15 45D LOW PRO (SUTURE) ×3 IMPLANT
IV LACTATED RINGER IRRG 3000ML (IV SOLUTION) ×4
IV LR IRRIG 3000ML ARTHROMATIC (IV SOLUTION) ×2 IMPLANT
MANIFOLD NEPTUNE II (INSTRUMENTS) ×3 IMPLANT
MASK FACE SPIDER DISP (MASK) ×3 IMPLANT
MAT BLUE FLOOR 46X72 FLO (MISCELLANEOUS) IMPLANT
NDL MAYO CATGUT SZ5 (NEEDLE)
NDL SUT 5 .5 CRC TPR PNT MAYO (NEEDLE) IMPLANT
NEEDLE REVERSE CUT 1/2 CRC (NEEDLE) IMPLANT
PACK ARTHROSCOPY SHOULDER (MISCELLANEOUS) ×3 IMPLANT
SLING ARM LRG DEEP (SOFTGOODS) IMPLANT
SLING ULTRA II LG (MISCELLANEOUS) ×3 IMPLANT
STAPLER SKIN PROX 35W (STAPLE) ×3 IMPLANT
STRAP SAFETY BODY (MISCELLANEOUS) ×3 IMPLANT
SUT ETHIBOND 0 MO6 C/R (SUTURE) ×3 IMPLANT
SUT VIC AB 2-0 CT1 27 (SUTURE) ×2
SUT VIC AB 2-0 CT1 TAPERPNT 27 (SUTURE) ×1 IMPLANT
TAPE MICROFOAM 4IN (TAPE) IMPLANT
TUBING ARTHRO INFLOW-ONLY STRL (TUBING) ×3 IMPLANT
TUBING CONNECTING 10 (TUBING) ×2 IMPLANT
TUBING CONNECTING 10' (TUBING) ×1
WAND HAND CNTRL MULTIVAC 90 (MISCELLANEOUS) ×3 IMPLANT

## 2017-10-13 NOTE — Anesthesia Preprocedure Evaluation (Signed)
Anesthesia Evaluation  Patient identified by MRN, date of birth, ID band Patient awake    Reviewed: Allergy & Precautions, H&P , NPO status , Patient's Chart, lab work & pertinent test results  History of Anesthesia Complications Negative for: history of anesthetic complications  Airway Mallampati: III  TM Distance: <3 FB Neck ROM: full    Dental  (+) Chipped, Caps, Poor Dentition   Pulmonary neg shortness of breath, asthma , former smoker,           Cardiovascular Exercise Tolerance: Good (-) angina(-) Past MI and (-) DOE negative cardio ROS       Neuro/Psych PSYCHIATRIC DISORDERS Bipolar Disorder negative neurological ROS     GI/Hepatic negative GI ROS, Neg liver ROS,   Endo/Other  diabetes, Type 2  Renal/GU      Musculoskeletal   Abdominal   Peds  Hematology negative hematology ROS (+)   Anesthesia Other Findings Past Medical History: No date: Bipolar 1 disorder (HCC) No date: Diabetes mellitus without complication (HCC) No date: Elevated lipids No date: Seasonal allergies No date: Wheezing  Past Surgical History: No date: CHOLECYSTECTOMY  BMI    Body Mass Index:  31.90 kg/m      Reproductive/Obstetrics negative OB ROS                             Anesthesia Physical Anesthesia Plan  ASA: III  Anesthesia Plan: General ETT   Post-op Pain Management: GA combined w/ Regional for post-op pain   Induction: Intravenous  PONV Risk Score and Plan: 1 and Ondansetron, Dexamethasone and Midazolam  Airway Management Planned: Oral ETT  Additional Equipment:   Intra-op Plan:   Post-operative Plan: Extubation in OR  Informed Consent: I have reviewed the patients History and Physical, chart, labs and discussed the procedure including the risks, benefits and alternatives for the proposed anesthesia with the patient or authorized representative who has indicated his/her  understanding and acceptance.   Dental Advisory Given  Plan Discussed with: Anesthesiologist, CRNA and Surgeon  Anesthesia Plan Comments: (Patient consented for risks of anesthesia including but not limited to:  - adverse reactions to medications - damage to teeth, lips or other oral mucosa - sore throat or hoarseness - Damage to heart, brain, lungs or loss of life  Patient voiced understanding.)        Anesthesia Quick Evaluation

## 2017-10-13 NOTE — Transfer of Care (Signed)
Immediate Anesthesia Transfer of Care Note  Patient: Daniel [name]  Procedure(s) Performed: Procedure(s): SHOULDER ARTHROSCOPY WITH OPEN ROTATOR CUFF REPAIR/ (Right) SHOULDER ARTHROSCOPY WITH DISTAL CLAVICLE RESECTION (Right)  Patient Location: PACU  Anesthesia Type:General  Level of Consciousness: sedated  Airway & Oxygen Therapy: Patient Spontanous Breathing and Patient connected to face mask oxygen  Post-op Assessment: Report given to RN and Post -op Vital signs reviewed and stable  Post vital signs: Reviewed and stable  Last Vitals:  Vitals:   10/13/17 1330 10/13/17 1627  BP: 131/89 (!) 147/82  Pulse: 83 (!) 101  Resp: 17 20  Temp:  36.8 C  SpO2: 100% 94%    Complications: No apparent anesthesia complications

## 2017-10-13 NOTE — Progress Notes (Signed)
  Report to OR RN Lawernce Keasonna Twine RN.

## 2017-10-13 NOTE — OR Nursing (Signed)
To PACU for block 1241 pm

## 2017-10-13 NOTE — Anesthesia Postprocedure Evaluation (Signed)
Anesthesia Post Note  Patient: Daniel [name]  Procedure(s) Performed: SHOULDER ARTHROSCOPY WITH OPEN ROTATOR CUFF REPAIR/ (Right Shoulder) SHOULDER ARTHROSCOPY WITH DISTAL CLAVICLE RESECTION (Right Shoulder)  Patient location during evaluation: PACU Anesthesia Type: General Level of consciousness: awake and alert and oriented Pain management: pain level controlled Vital Signs Assessment: post-procedure vital signs reviewed and stable Respiratory status: spontaneous breathing Cardiovascular status: blood pressure returned to baseline Anesthetic complications: no     Last Vitals:  Vitals:   10/13/17 1627 10/13/17 1642  BP: (!) 147/82 (!) 149/88  Pulse: (!) 101 100  Resp: 20 16  Temp: 36.8 C   SpO2: 94% 95%    Last Pain:  Vitals:   10/13/17 1627  TempSrc:   PainSc: 0-No pain                 Crystal Scarberry

## 2017-10-13 NOTE — H&P (Signed)
Paper H&P to be scanned into permanent record. H&P reviewed and patient re-examined. No changes. 

## 2017-10-13 NOTE — Anesthesia Procedure Notes (Signed)
Procedure Name: Intubation Date/Time: 10/13/2017 2:12 PM Performed by: Darrol JumpMarion, Izael, CRNA Pre-anesthesia Checklist: Patient identified, Emergency Drugs available, Suction available and Patient being monitored Patient Re-evaluated:Patient Re-evaluated prior to induction Oxygen Delivery Method: Circle system utilized Preoxygenation: Pre-oxygenation with 100% oxygen Induction Type: IV induction Laryngoscope Size: Miller and 4 Grade View: Grade I Tube type: Oral Tube size: 7.0 mm Number of attempts: 1 Placement Confirmation: ETT inserted through vocal cords under direct vision,  positive ETCO2 and breath sounds checked- equal and bilateral Secured at: 23 cm Tube secured with: Tape Dental Injury: Teeth and Oropharynx as per pre-operative assessment

## 2017-10-13 NOTE — Discharge Instructions (Addendum)
AMBULATORY SURGERY  DISCHARGE INSTRUCTIONS   1) The drugs that you were given will stay in your system until tomorrow so for the next 24 hours you should not:  A) Drive an automobile B) Make any legal decisions C) Drink any alcoholic beverage   2) You may resume regular meals tomorrow.  Today it is better to start with liquids and gradually work up to solid foods.  You may eat anything you prefer, but it is better to start with liquids, then soup and crackers, and gradually work up to solid foods.   3) Please notify your doctor immediately if you have any unusual bleeding, trouble breathing, redness and pain at the surgery site, drainage, fever, or pain not relieved by medication. 4)   5) Your post-operative visit with Dr.                                     is: Date:                        Time:    Please call to schedule your post-operative visit.  6) Additional Instructions:      Keep dressing dry and intact.  May shower after dressing changed on post-op day #4 (Saturday).  Cover staples with Band-Aids after drying off. Apply ice frequently to shoulder. Take naproxen 500 mg BID with meals for 7-10 days, then as necessary. Take oxycodone as prescribed when needed.  May supplement with ES Tylenol if necessary. Keep shoulder immobilizer on at all times except may remove for bathing purposes. Follow-up in 10-14 days or as scheduled.

## 2017-10-13 NOTE — Op Note (Signed)
10/13/2017  4:24 PM  Patient:   Daniel [name] A Karas  Pre-Op Diagnosis:   Impingement/tendinopathy with possible partial thickness rotator cuff tear and degenerative joint disease of AC joint, right shoulder.  Post-Op Diagnosis: Impingement/tendinopathy with partial-thickness rotator cuff tear, degenerative joint disease of AC joint, and biceps tendinopathy, right shoulder.  Procedure: Limited arthroscopic debridement, arthroscopic subacromial decompression, arthroscopic rotator cuff repair, arthroscopic excision of distal clavicle, and mini-open biceps tenodesis, right shoulder.  Anesthesia: General endotracheal with interscalene block placed preoperatively by the anesthesiologist.  Surgeon:   Maryagnes AmosJ. Jeffrey Aleni Andrus, MD  Assistant:   Linus OrnNicole Stevens, PA-S  Findings: As above.  There was a partial-thickness tear involving the superior insertional fibers of the subscapularis tendon, as well as a small partial-thickness articular surface tear involving the articular side of the anterior insertional fibers of the subscapularis tendon (less than 20% of the footprint involved).  The remainder of the rotator cuff was in satisfactory condition.  The biceps tendon itself demonstrated areas of tendinitis without tearing.  The labrum was in satisfactory condition, as were the articular surfaces of both the glenoid and humerus.  Complications: None  Fluids:   1000 cc  Estimated blood loss: 5 cc  Tourniquet time: None  Drains: None  Closure: Staples   Brief clinical note: The patient is a 46 year old male with a history of right shoulder pain. The patient's symptoms have progressed despite medications, activity modification, etc. The patient's history and examination are consistent with impingement/tendinopathy with a possible rotator cuff tear. These findings were confirmed by MRI scan, which also demonstrated significant arthropathy of the [facility] joint and increased bone marrow signal in  the distal clavicle. The patient presents at this time for definitive management of these shoulder symptoms.  Procedure: The patient underwent placement of an interscalene block by the anesthesiologist in the preoperative holding area before being brought into the operating room and lain in the supine position. The patient then underwent general endotracheal intubation and anesthesia before being repositioned in the beach chair position using the beach chair positioner. The right shoulder and upper extremity were prepped with ChloraPrep solution before being draped sterilely. Preoperative antibiotics were administered. A timeout was performed to confirm the proper surgical site before the expected portal sites and incision site were injected with 0.5% Sensorcaine with epinephrine. A posterior portal was created and the glenohumeral joint thoroughly inspected with the findings as described above. An anterior portal was created using an outside-in technique. The labrum and rotator cuff were further probed, again confirming the above-noted findings. The areas of rotator cuff fraying involving both the anterior insertional fibers of the supraspinatus tendon as well as the superior insertional fibers of the subscapularis tendon were debrided back to stable margins using the full-radius resector.  The biceps tendon was probed and found to have areas of inflammation ("lip sticking").  These findings, in conjunction with the partial thickness tear of the superior fibers of the subscapularis tendon, made us decide to proceed with a biceps tenodesis. Therefore, the ArthroCare wand was inserted and used to release the biceps tendon from its labral anchor.   A separate superolateral portal site was created using outside in technique before the subscapularis tendon tear was repaired using a single Mitek BioKnotless anchor placed to the anterior portal.  The repair was assessed and found to be stable both with external  rotation and to probing. The instruments were removed from the joint after suctioning the excess fluid.  The camera was repositioned through the posterior portal  into the subacromial space. A separate lateral portal was created using an outside-in technique. The 3.5 mm full-radius resector was introduced and used to perform a subtotal bursectomy. The ArthroCare wand was then inserted and used to remove the periosteal tissue off the undersurface of the anterior third of the acromion as well as to recess the coracoacromial ligament from its attachment along the anterior and lateral margins of the acromion. The 4.0 mm acromionizing bur was introduced and used to complete the decompression by removing the undersurface of the anterior third of the acromion.   The ArthroCare wand was reintroduced and used to remove the periosteum off the undersurface surface of the [facility] joint as well as off the undersurface of the distal clavicle before the full radius resector was used to remove the inferior portion of the distal clavicle.  The camera was repositioned in the lateral portal and the 4 mm acromionizer bur introduced to the anterior portal. Under arthroscopic visualization, the remainder of the distal clavicle was removed. A total of 10 mm of distal clavicle was removed. The full radius resector was reintroduced to remove any residual bony debris before the ArthroCare wand was reintroduced to obtain hemostasis. The camera was repositioned through the anterior portal to better visualize the adequacy of distal clavicle excision. Once this was deemed satisfactory, the instruments were removed from the subacromial space after suctioning the excess fluid.  An approximately 4 cm incision was made over the anterolateral aspect of the shoulder beginning at the anterolateral corner of the acromion and extending distally in line with the bicipital groove, incorporating the superolateral portal site. This incision was carried down  through the subcutaneous tissues to expose the deltoid fascia. The raphae between the anterior and middle thirds was identified and this plane developed to provide access into the subacromial space. Additional bursal tissues were debrided sharply using Metzenbaum scissors. The rotator cuff tear was carefully inspected. No bursal surface tears were identified.  The bicipital groove was identified by palpation and opened for 1-1.5 cm. The biceps tendon stump was retrieved through this defect. The floor of the bicipital groove was roughened with a curet before a single Biomet 2.9 mm JuggerKnot anchor was inserted. Both sets of sutures were passed through the biceps tendon and tied securely to effect the tenodesis. The bicipital sheath was reapproximated using two #0 Ethibond interrupted sutures, incorporating the biceps tendon to further reinforce the tenodesis.  The wound was copiously irrigated with sterile saline solution before the deltoid raphae was reapproximated using 2-0 Vicryl interrupted sutures. The subcutaneous tissues were closed in two layers using 2-0 Vicryl interrupted sutures before the skin was closed using staples. The portal sites also were closed using staples. A sterile bulky dressing was applied to the shoulder before the arm was placed into a shoulder immobilizer. The patient was then awakened, extubated, and returned to the recovery room in satisfactory condition after tolerating the procedure well.

## 2017-10-13 NOTE — Anesthesia Post-op Follow-up Note (Signed)
Anesthesia QCDR form completed.        

## 2017-10-13 NOTE — Anesthesia Procedure Notes (Signed)
Anesthesia Regional Block: Interscalene brachial plexus block   Pre-Anesthetic Checklist: ,, timeout performed, Correct Patient, Correct Site, Correct Laterality, Correct Procedure, Correct Position, site marked, Risks and benefits discussed,  Surgical consent,  Pre-op evaluation,  At surgeon's request and post-op pain management  Laterality: Upper and Right  Prep: chloraprep       Needles:  Injection technique: Single-shot  Needle Type: Stimiplex     Needle Length: 5cm  Needle Gauge: 22     Additional Needles:   Narrative:  Start time: 10/13/2017 12:50 PM End time: 10/13/2017 12:53 PM Injection made incrementally with aspirations every 5 mL.  Performed by: Personally  Anesthesiologist: Piscitello, Cleda MccreedyJoseph K, MD  Additional Notes: Functioning IV was confirmed and monitors were applied.  A 50mm 22ga Stimuplex needle was used. Sterile prep,hand hygiene and sterile gloves were used.  Minimal sedation used for procedure.  No paresthesia endorsed by patient during the procedure.  Negative aspiration and negative test dose prior to incremental administration of local anesthetic. The patient tolerated the procedure well with no immediate complications.

## 2017-10-14 ENCOUNTER — Encounter: Payer: Self-pay | Admitting: Surgery

## 2017-10-15 DIAGNOSIS — M75111 Incomplete rotator cuff tear or rupture of right shoulder, not specified as traumatic: Secondary | ICD-10-CM | POA: Insufficient documentation

## 2017-10-15 DIAGNOSIS — M7521 Bicipital tendinitis, right shoulder: Secondary | ICD-10-CM | POA: Insufficient documentation

## 2018-01-07 DIAGNOSIS — E291 Testicular hypofunction: Secondary | ICD-10-CM | POA: Insufficient documentation

## 2018-03-05 ENCOUNTER — Emergency Department
Admission: EM | Admit: 2018-03-05 | Discharge: 2018-03-05 | Disposition: A | Discharge status: Home / Self Care | Payer: Medicare HMO | Attending: Emergency Medicine | Admitting: Emergency Medicine

## 2018-03-05 ENCOUNTER — Emergency Department: Payer: Medicare HMO

## 2018-03-05 ENCOUNTER — Other Ambulatory Visit: Payer: Self-pay

## 2018-03-05 ENCOUNTER — Encounter: Payer: Self-pay | Admitting: Emergency Medicine

## 2018-03-05 DIAGNOSIS — Z87891 Personal history of nicotine dependence: Secondary | ICD-10-CM | POA: Insufficient documentation

## 2018-03-05 DIAGNOSIS — R1013 Epigastric pain: Secondary | ICD-10-CM | POA: Insufficient documentation

## 2018-03-05 DIAGNOSIS — Z7984 Long term (current) use of oral hypoglycemic drugs: Secondary | ICD-10-CM | POA: Diagnosis not present

## 2018-03-05 DIAGNOSIS — Z7982 Long term (current) use of aspirin: Secondary | ICD-10-CM | POA: Insufficient documentation

## 2018-03-05 DIAGNOSIS — E119 Type 2 diabetes mellitus without complications: Secondary | ICD-10-CM | POA: Diagnosis not present

## 2018-03-05 LAB — COMPREHENSIVE METABOLIC PANEL
ALBUMIN: 3.6 g/dL (ref 3.5–5.0)
ALT: 15 U/L — AB (ref 17–63)
AST: 17 U/L (ref 15–41)
Alkaline Phosphatase: 59 U/L (ref 38–126)
Anion gap: 6 (ref 5–15)
BUN: 9 mg/dL (ref 6–20)
CHLORIDE: 102 mmol/L (ref 101–111)
CO2: 29 mmol/L (ref 22–32)
CREATININE: 0.98 mg/dL (ref 0.61–1.24)
Calcium: 8.7 mg/dL — ABNORMAL LOW (ref 8.9–10.3)
GFR calc Af Amer: >60 mL/min (ref >60)
GLUCOSE: 169 mg/dL — AB (ref 65–99)
POTASSIUM: 3.7 mmol/L (ref 3.5–5.1)
Sodium: 137 mmol/L (ref 135–145)
Total Bilirubin: 0.5 mg/dL (ref 0.3–1.2)
Total Protein: 6.3 g/dL — ABNORMAL LOW (ref 6.5–8.1)

## 2018-03-05 LAB — CBC WITH DIFFERENTIAL/PLATELET
BASOS ABS: 0 10*3/uL (ref 0–0.1)
Basophils Relative: 1 %
EOS PCT: 15 %
Eosinophils Absolute: 1.2 10*3/uL — ABNORMAL HIGH (ref 0–0.7)
HEMATOCRIT: 42.2 % (ref 40.0–52.0)
Hemoglobin: 13.8 g/dL (ref 13.0–18.0)
LYMPHS ABS: 1.7 10*3/uL (ref 1.0–3.6)
LYMPHS PCT: 21 %
MCH: 26.9 pg (ref 26.0–34.0)
MCHC: 32.7 g/dL (ref 32.0–36.0)
MCV: 82.2 fL (ref 80.0–100.0)
MONOS PCT: 9 %
Monocytes Absolute: 0.7 10*3/uL (ref 0.2–1.0)
NEUTROS ABS: 4.5 10*3/uL (ref 1.4–6.5)
Neutrophils Relative %: 54 %
Platelets: 280 10*3/uL (ref 150–440)
RBC: 5.13 MIL/uL (ref 4.40–5.90)
RDW: 13.3 % (ref 11.5–14.5)
WBC: 8.2 10*3/uL (ref 3.8–10.6)

## 2018-03-05 LAB — LIPASE, BLOOD: Lipase: 29 U/L (ref 11–51)

## 2018-03-05 LAB — TROPONIN I: Troponin I: <0.03 ng/mL (ref <0.03)

## 2018-03-05 MED ORDER — FAMOTIDINE 20 MG PO TABS: 20.0000 mg | ORAL_TABLET | Freq: Two times a day (BID) | ORAL | 0 refills | Status: DC

## 2018-03-05 MED ORDER — GI COCKTAIL ~~LOC~~
30.0000 mL | Freq: Once | ORAL | Status: AC
  Administered 2018-03-05: 30 mL via ORAL
  Filled 2018-03-05: qty 30

## 2018-03-05 NOTE — Discharge Instructions (Signed)
Please take famotidine twice a day for the next month and follow-up with your primary care physician this coming Monday for reevaluation as needed.  Return to the emergency department sooner for any new or worsening symptoms such as fevers, chills, worsening pain, or for any other issues whatsoever.  It was a pleasure to take care of you today, and thank you for coming to our emergency department.  If you have any questions or concerns before leaving please ask the nurse to grab me and I'm more than happy to go through your aftercare instructions again.  If you were prescribed any opioid pain medication today such as Norco, Vicodin, Percocet, morphine, hydrocodone, or oxycodone please make sure you do not drive when you are taking this medication as it can alter your ability to drive safely.  If you have any concerns once you are home that you are not improving or are in fact getting worse before you can make it to your follow-up appointment, please do not hesitate to call [phone] and come back for further evaluation.  Merrily Brittle, MD  Results for orders placed or performed during the hospital encounter of 03/05/18  Comprehensive metabolic panel  Result Value Ref Range   Sodium 137 135 - 145 mmol/L   Potassium 3.7 3.5 - 5.1 mmol/L   Chloride 102 101 - 111 mmol/L   CO2 29 22 - 32 mmol/L   Glucose, Bld 169 (H) 65 - 99 mg/dL   BUN 9 6 - 20 mg/dL   Creatinine, Ser 4.09 0.61 - 1.24 mg/dL   Calcium 8.7 (L) 8.9 - 10.3 mg/dL   Total Protein 6.3 (L) 6.5 - 8.1 g/dL   Albumin 3.6 3.5 - 5.0 g/dL   AST 17 15 - 41 U/L   ALT 15 (L) 17 - 63 U/L   Alkaline Phosphatase 59 38 - 126 U/L   Total Bilirubin 0.5 0.3 - 1.2 mg/dL   GFR calc non Af Amer >60 >60 mL/min   GFR calc Af Amer >60 >60 mL/min   Anion gap 6 5 - 15  Troponin I  Result Value Ref Range   Troponin I <0.03 <0.03 ng/mL  CBC with Differential  Result Value Ref Range   WBC 8.2 3.8 - 10.6 K/uL   RBC 5.13 4.40 - 5.90 MIL/uL   Hemoglobin 13.8  13.0 - 18.0 g/dL   HCT 81.1 91.4 - 78.2 %   MCV 82.2 80.0 - 100.0 fL   MCH 26.9 26.0 - 34.0 pg   MCHC 32.7 32.0 - 36.0 g/dL   RDW 95.6 21.3 - 08.6 %   Platelets 280 150 - 440 K/uL   Neutrophils Relative % 54 %   Neutro Abs 4.5 1.4 - 6.5 K/uL   Lymphocytes Relative 21 %   Lymphs Abs 1.7 1.0 - 3.6 K/uL   Monocytes Relative 9 %   Monocytes Absolute 0.7 0.2 - 1.0 K/uL   Eosinophils Relative 15 %   Eosinophils Absolute 1.2 (H) 0 - 0.7 K/uL   Basophils Relative 1 %   Basophils Absolute 0.0 0 - 0.1 K/uL  Lipase, blood  Result Value Ref Range   Lipase 29 11 - 51 U/L   Dg Chest Portable 1 View  Result Date: 03/05/2018 CLINICAL DATA:  47 y/o  M; chest pain with associated nausea. EXAM: PORTABLE CHEST 1 VIEW COMPARISON:  07/27/2016 chest radiograph. FINDINGS: Stable cardiac silhouette within normal limits given projection and technique. Stable calcified granuloma in the right upper lobe. No consolidation,  effusion, or pneumothorax. Bones are unremarkable. IMPRESSION: No acute pulmonary process identified. Stable calcified granuloma in right upper lobe. Electronically Signed   By: Mitzi HansenLance  Furusawa-Stratton M.D.   On: 03/05/2018 04:31

## 2018-03-05 NOTE — ED Provider Notes (Signed)
[facility] Emergency Department Provider Note  ____________________________________________   First MD Initiated Contact with Patient 03/05/18 0408     (approximate)  I have reviewed the triage vital signs and the nursing notes.   HISTORY  Chief Complaint Chest Pain   HPI Daniel [name] is a 47 y.o. male who self presents to the emergency department with lower chest/upper abdominal pain that awoke him from sleep at 3 AM roughly 45 minutes prior to arrival.  The pain is moderate to severe upper abdominal radiating diffusely across his upper abdomen.  He took a antacid at home without relief so he came to the hospital.  His symptoms have been constant.  Nonexertional.  No shortness of breath.  Some nausea but no vomiting.  No history of coronary artery disease.  No history of deep vein thromboses or pulmonary embolism.  No recent surgery travel or immobilization.  The pain is not ripping or tearing does not go straight to his back.  He has a history of a cholecystectomy.  Past Medical History:  Diagnosis Date  . Bipolar 1 disorder (HCC)   . Diabetes mellitus without complication (HCC)   . Elevated lipids   . Seasonal allergies   . Wheezing     There are no active problems to display for this patient.   Past Surgical History:  Procedure Laterality Date  . CHOLECYSTECTOMY    . SHOULDER ARTHROSCOPY WITH DISTAL CLAVICLE RESECTION Right 10/13/2017   Procedure: SHOULDER ARTHROSCOPY WITH DISTAL CLAVICLE RESECTION;  Surgeon: Christena FlakePoggi, John J, MD;  Location: [facility] ORS;  Service: Orthopedics;  Laterality: Right;  . SHOULDER ARTHROSCOPY WITH OPEN ROTATOR CUFF REPAIR Right 10/13/2017   Procedure: SHOULDER ARTHROSCOPY WITH OPEN ROTATOR CUFF REPAIR/;  Surgeon: Christena FlakePoggi, John J, MD;  Location: [facility] ORS;  Service: Orthopedics;  Laterality: Right;    Prior to Admission medications   Medication Sig Start Date End Date Taking? Authorizing Provider  albuterol (PROVENTIL  HFA;VENTOLIN HFA) 108 (90 Base) MCG/ACT inhaler Inhale 1 puff into the lungs every 6 (six) hours as needed for wheezing or shortness of breath.     [provider]  aspirin EC 81 MG tablet Take 81 mg by mouth at bedtime.     [provider]  atorvastatin (LIPITOR) 20 MG tablet Take 20 mg by mouth at bedtime.     [provider]  desonide (DESOWEN) 0.05 % ointment Apply 1 application topically 2 (two) times daily. 09/23/17   [provider]  divalproex (DEPAKOTE ER) 500 MG 24 hr tablet Take 500 mg by mouth every morning.    [provider]  famotidine (PEPCID) 20 MG tablet Take 1 tablet (20 mg total) by mouth 2 (two) times daily. 03/05/18 03/05/19  Merrily Brittleifenbark, Monserrath Junio, MD  gabapentin (NEURONTIN) 300 MG capsule Take 600 mg by mouth 2 (two) times daily.     [provider]  Melatonin 3 MG TABS Take 3 mg by mouth at bedtime.     [provider]  metFORMIN (GLUCOPHAGE-XR) 500 MG 24 hr tablet Take 1,500 mg by mouth daily before breakfast.     [provider]  naproxen (NAPROSYN) 500 MG tablet Take 500 mg by mouth 2 (two) times daily with a meal.    [provider]  oxyCODONE (ROXICODONE) 5 MG immediate release tablet Take 1-2 tablets (5-10 mg total) every 4 (four) hours as needed by mouth. 10/13/17   Poggi, Excell SeltzerJohn J, MD    Allergies Doxycycline; Other; Parabens; Tramadol; and  Penicillins  Family History  Problem Relation Age of Onset  . Diabetes Mother   . Hypertension Mother   . Stroke Father     Social History Social History   Tobacco Use  . Smoking status: Former Games developer  . Smokeless tobacco: Never Used  Substance Use Topics  . Alcohol use: No    Alcohol/week: 0.0 oz  . Drug use: No    Review of Systems Constitutional: No fever/chills Eyes: No visual changes. ENT: No sore throat. Cardiovascular: Positive for chest pain. Respiratory: Denies shortness of breath. Gastrointestinal: Positive for abdominal pain.   Positive for nausea, no vomiting.  No diarrhea.  No constipation. Genitourinary: Negative for dysuria. Musculoskeletal: Negative for back pain. Skin: Negative for rash. Neurological: Negative for headaches, focal weakness or numbness.   ____________________________________________   PHYSICAL EXAM:  VITAL SIGNS: ED Triage Vitals  Enc Vitals Group     BP 03/05/18 0408 133/86     Pulse Rate 03/05/18 0408 80     Resp 03/05/18 0408 18     Temp 03/05/18 0408 98.3 F (36.8 C)     Temp Source 03/05/18 0408 Oral     SpO2 03/05/18 0408 100 %     Weight 03/05/18 0402 206 lb (93.4 kg)     Height 03/05/18 0402 5\' 9"  (1.753 m)     Head Circumference --      Peak Flow --      Pain Score 03/05/18 0402 9     Pain Loc --      Pain Edu? --      Excl. in GC? --     Constitutional: Alert and oriented x4 somewhat uncomfortable appearing nontoxic no diaphoresis speaks in full clear sentences Eyes: PERRL EOMI. Head: Atraumatic. Nose: No congestion/rhinnorhea. Mouth/Throat: No trismus Neck: No stridor.   Cardiovascular: Normal rate, regular rhythm. Grossly normal heart sounds.  Good peripheral circulation. Respiratory: Normal respiratory effort.  No retractions. Lungs CTAB and moving good air Gastrointestinal: Somewhat distended abdomen although soft nontender no rebound or guarding no peritonitis Musculoskeletal: No lower extremity edema   Neurologic:  Normal speech and language. No gross focal neurologic deficits are appreciated. Skin:  Skin is warm, dry and intact. No rash noted. Psychiatric: Mood and affect are normal. Speech and behavior are normal.    ____________________________________________   DIFFERENTIAL includes but not limited to  Esophageal reflux, esophageal spasm, aortic dissection, pulmonary embolism, acute coronary syndrome ____________________________________________   LABS (all labs ordered are listed, but only abnormal results are displayed)  Labs Reviewed    COMPREHENSIVE METABOLIC PANEL - Abnormal; Notable for the following components:      Result Value   Glucose, Bld 169 (*)    Calcium 8.7 (*)    Total Protein 6.3 (*)    ALT 15 (*)    All other components within normal limits  CBC WITH DIFFERENTIAL/PLATELET - Abnormal; Notable for the following components:   Eosinophils Absolute 1.2 (*)    All other components within normal limits  TROPONIN I  LIPASE, BLOOD  TROPONIN I    Lab work reviewed by me with no signs of acute ischemia x1 __________________________________________  EKG  ED ECG REPORT I, Merrily Brittle, the attending physician, personally viewed and interpreted this ECG.  Date: 03/05/2018 EKG Time:  Rate: 82 Rhythm: normal sinus rhythm QRS Axis: normal Intervals: normal ST/T Wave abnormalities: normal Narrative Interpretation: no evidence of acute ischemia  ____________________________________________  RADIOLOGY  Chest x-ray reviewed by me with no acute disease  ____________________________________________   PROCEDURES  Procedure(s) performed: no  Procedures  Critical Care performed: no  Observation: no ____________________________________________   INITIAL IMPRESSION / ASSESSMENT AND PLAN / ED COURSE  Pertinent labs & imaging results that were available during my care of the patient were reviewed by me and considered in my medical decision making (see chart for details).  The patient arrives with epigastric burning discomfort that is constant and nonexertional.  EKG is nonischemic.  Blood work including lipase and troponin are pending.     ----------------------------------------- 5:28 AM on 03/05/2018 -----------------------------------------  After GI cocktail the patient's pain is gone from a 9 out of 10 to a 2 out of 10.  EKG is nonischemic and first set of blood work is normal.  I offered the patient had delta troponin at 3 hours to fully evaluate for acute ischemia however he declined  stating he thought that this was reflux and he would prefer to follow-up with his primary care physician.  I will give him a one-month course of famotidine and strict return precautions have been given.  The patient verbalizes understanding and agreement with the plan. ____________________________________________   FINAL CLINICAL IMPRESSION(S) / ED DIAGNOSES  Final diagnoses:  Epigastric pain      NEW MEDICATIONS STARTED DURING THIS VISIT:  New Prescriptions   FAMOTIDINE (PEPCID) 20 MG TABLET    Take 1 tablet (20 mg total) by mouth 2 (two) times daily.     Note:  This document was prepared using Dragon voice recognition software and may include unintentional dictation errors.     Merrily Brittle, MD 03/05/18 [phone]

## 2018-03-05 NOTE — ED Triage Notes (Signed)
Patient ambulatory to triage with steady gait, without difficulty or distress noted; pt reports mid CP that radiates across upper chest since awakening at 3am , accomp by nausea; denies hx of same

## 2019-03-15 DIAGNOSIS — J9809 Other diseases of bronchus, not elsewhere classified: Secondary | ICD-10-CM | POA: Insufficient documentation

## 2019-05-13 DIAGNOSIS — R399 Unspecified symptoms and signs involving the genitourinary system: Secondary | ICD-10-CM | POA: Insufficient documentation

## 2019-05-16 ENCOUNTER — Emergency Department
Admission: EM | Admit: 2019-05-16 | Discharge: 2019-05-16 | Disposition: A | Discharge status: Home / Self Care | Payer: Medicare Other | Attending: Emergency Medicine | Admitting: Emergency Medicine

## 2019-05-16 ENCOUNTER — Other Ambulatory Visit: Payer: Self-pay

## 2019-05-16 ENCOUNTER — Encounter: Payer: Self-pay | Admitting: Emergency Medicine

## 2019-05-16 DIAGNOSIS — Z5321 Procedure and treatment not carried out due to patient leaving prior to being seen by health care provider: Secondary | ICD-10-CM | POA: Diagnosis not present

## 2019-05-16 DIAGNOSIS — R1031 Right lower quadrant pain: Secondary | ICD-10-CM | POA: Diagnosis present

## 2019-05-16 LAB — URINALYSIS, COMPLETE (UACMP) WITH MICROSCOPIC
Bacteria, UA: NONE SEEN
Bilirubin Urine: NEGATIVE
Glucose, UA: >=500 mg/dL — AB
Hgb urine dipstick: NEGATIVE
Ketones, ur: NEGATIVE mg/dL
Leukocytes,Ua: NEGATIVE
Nitrite: NEGATIVE
Protein, ur: NEGATIVE mg/dL
Specific Gravity, Urine: 1.017 (ref 1.005–1.030)
Squamous Epithelial / LPF: NONE SEEN (ref 0–5)
pH: 6 (ref 5.0–8.0)

## 2019-05-16 LAB — COMPREHENSIVE METABOLIC PANEL
ALT: 20 U/L (ref 0–44)
AST: 22 U/L (ref 15–41)
Albumin: 4.1 g/dL (ref 3.5–5.0)
Alkaline Phosphatase: 56 U/L (ref 38–126)
Anion gap: 7 (ref 5–15)
BUN: 16 mg/dL (ref 6–20)
CO2: 30 mmol/L (ref 22–32)
Calcium: 9 mg/dL (ref 8.9–10.3)
Chloride: 100 mmol/L (ref 98–111)
Creatinine, Ser: 1.03 mg/dL (ref 0.61–1.24)
GFR calc Af Amer: >60 mL/min (ref >60)
GFR calc non Af Amer: >60 mL/min (ref >60)
Glucose, Bld: 138 mg/dL — ABNORMAL HIGH (ref 70–99)
Potassium: 3.9 mmol/L (ref 3.5–5.1)
Sodium: 137 mmol/L (ref 135–145)
Total Bilirubin: 0.5 mg/dL (ref 0.3–1.2)
Total Protein: 6.6 g/dL (ref 6.5–8.1)

## 2019-05-16 LAB — CBC
HCT: 42.9 % (ref 39.0–52.0)
Hemoglobin: 13.8 g/dL (ref 13.0–17.0)
MCH: 26.8 pg (ref 26.0–34.0)
MCHC: 32.2 g/dL (ref 30.0–36.0)
MCV: 83.5 fL (ref 80.0–100.0)
Platelets: 186 10*3/uL (ref 150–400)
RBC: 5.14 MIL/uL (ref 4.22–5.81)
RDW: 14.2 % (ref 11.5–15.5)
WBC: 5.8 10*3/uL (ref 4.0–10.5)
nRBC: 0 % (ref 0.0–0.2)

## 2019-05-16 LAB — LIPASE, BLOOD: Lipase: 27 U/L (ref 11–51)

## 2019-05-16 MED ORDER — SODIUM CHLORIDE 0.9% FLUSH: 3.0000 mL | Freq: Once | INTRAVENOUS | Status: DC

## 2019-05-16 NOTE — ED Triage Notes (Signed)
Lower R abdominal pain since yesterday.

## 2019-05-16 NOTE — ED Notes (Signed)
Urine, green, and purple tubes sent to lab.

## 2019-10-12 ENCOUNTER — Other Ambulatory Visit: Payer: Self-pay | Admitting: Orthopedic Surgery

## 2019-10-12 ENCOUNTER — Telehealth: Payer: Self-pay | Admitting: *Deleted

## 2019-10-12 DIAGNOSIS — M1711 Unilateral primary osteoarthritis, right knee: Secondary | ICD-10-CM

## 2019-10-13 ENCOUNTER — Telehealth: Payer: Self-pay | Admitting: *Deleted

## 2019-10-20 ENCOUNTER — Ambulatory Visit
Admission: RE | Admit: 2019-10-20 | Discharge: 2019-10-20 | Disposition: A | Discharge status: Home / Self Care | Payer: Medicare Other | Source: Ambulatory Visit | Attending: Orthopedic Surgery | Admitting: Orthopedic Surgery

## 2019-10-20 ENCOUNTER — Other Ambulatory Visit: Payer: Self-pay

## 2019-10-20 DIAGNOSIS — M1711 Unilateral primary osteoarthritis, right knee: Secondary | ICD-10-CM | POA: Insufficient documentation

## 2019-11-15 ENCOUNTER — Other Ambulatory Visit: Payer: Self-pay | Admitting: Orthopedic Surgery

## 2019-11-25 ENCOUNTER — Inpatient Hospital Stay: Admission: RE | Admit: 2019-11-25 | Payer: Medicare Other | Source: Ambulatory Visit

## 2019-11-29 ENCOUNTER — Encounter
Admission: RE | Admit: 2019-11-29 | Discharge: 2019-11-29 | Disposition: A | Discharge status: Home / Self Care | Payer: Medicare Other | Source: Ambulatory Visit | Attending: Orthopedic Surgery | Admitting: Orthopedic Surgery

## 2019-11-29 ENCOUNTER — Inpatient Hospital Stay: Admission: RE | Admit: 2019-11-29 | Payer: Medicare Other | Source: Ambulatory Visit

## 2019-11-29 ENCOUNTER — Other Ambulatory Visit: Payer: Self-pay

## 2019-11-29 DIAGNOSIS — Z01818 Encounter for other preprocedural examination: Secondary | ICD-10-CM | POA: Diagnosis not present

## 2019-11-29 DIAGNOSIS — Z20828 Contact with and (suspected) exposure to other viral communicable diseases: Secondary | ICD-10-CM | POA: Insufficient documentation

## 2019-11-29 HISTORY — DX: Other chronic pain: G89.29

## 2019-11-29 HISTORY — DX: Depression, unspecified: F32.A

## 2019-11-29 HISTORY — DX: Headache, unspecified: R51.9

## 2019-11-29 HISTORY — DX: Other cervical disc degeneration, unspecified cervical region: M50.30

## 2019-11-29 HISTORY — DX: Male erectile dysfunction, unspecified: N52.9

## 2019-11-29 HISTORY — DX: Insomnia, unspecified: G47.00

## 2019-11-29 HISTORY — DX: Gastro-esophageal reflux disease without esophagitis: K21.9

## 2019-11-29 NOTE — Patient Instructions (Signed)
Your procedure is scheduled on: Tues 12/29 Report to Day Surgery. To find out your arrival time please call [phone] between 1PM - 3PM on Mon 12/28.  Remember: Instructions that are not followed completely may result in serious medical risk,  up to and including death, or upon the discretion of your surgeon and anesthesiologist your  surgery may need to be rescheduled.     _X__ 1. Do not eat food after midnight the night before your procedure.                 No gum chewing or hard candies. You may drink clear liquids up to 2 hours                 before you are scheduled to arrive for your surgery- DO not drink clear                 liquids within 2 hours of the start of your surgery.                 Clear Liquids include:  water,                   Gatorade, Black Coffee or Tea (Do not add                 anything to coffee or tea).  __X__2.  On the morning of surgery brush your teeth with toothpaste and water, you                may rinse your mouth with mouthwash if you wish.  Do not swallow any toothpaste of mouthwash.     ___ 3.  No Alcohol for 24 hours before or after surgery.   ___ 4.  Do Not Smoke or use e-cigarettes For 24 Hours Prior to Your Surgery.                 Do not use any chewable tobacco products for at least 6 hours prior to                 surgery.  ____  5.  Bring all medications with you on the day of surgery if instructed.   __x__  6.  Notify your doctor if there is any change in your medical condition      (cold, fever, infections).     Do not wear jewelry, make-up, hairpins, clips or nail polish. Do not wear lotions, powders, or perfumes. You may wear deodorant. Do not shave 48 hours prior to surgery. Men may shave face and neck. Do not bring valuables to the hospital.    [facility] is not responsible for any belongings or valuables.  Contacts, dentures or bridgework may not be worn into surgery. Leave your suitcase in the  car. After surgery it may be brought to your room. For patients admitted to the hospital, discharge time is determined by your treatment team.   Patients discharged the day of surgery will not be allowed to drive home.   Please read over the following fact sheets that you were given:    __x__ Take these medicines the morning of surgery with A SIP OF WATER:    1. alfuzosin (UROXATRAL) 10 MG 24 hr tablet  2. cetirizine (ZYRTEC) 10 MG tablet  3. divalproex (DEPAKOTE ER) 250 MG 24 hr tablet  4.famotidine (PEPCID) 20 MG tablet  5.FLUoxetine (PROZAC) 20 MG capsule  6.gabapentin (NEURONTIN) 300 MG capsule  oxyCODONE (  ROXICODONE) 5 MG immediate release tablet if needed pantoprazole (PROTONIX) 40 MG tablet ____ Fleet Enema (as directed)   __x__ Use CHG Soap as directed  __x__ Use inhalers on the day of surgeryalbuterol (PROVENTIL HFA;VENTOLIN HFA) 108 (90 Base) MCG/ACT inhaler and bring with you  ____ Stop metformin 2 days prior to surgery    ____ Take 1/2 of usual insulin dose the night before surgery. No insulin the morning          of surgery.   ____ Stop Coumadin/Plavix/aspirin on   _x___ Stop Anti-inflammatories on meloxicam (MOBIC) 15 MG tablet Aleve Ibuprofen   May take tylenol   ____ Stop supplements until after surgery.    ____ Bring C-Pap to the hospital.   Incentive  Spirometry G 2

## 2019-11-30 ENCOUNTER — Encounter
Admission: RE | Admit: 2019-11-30 | Discharge: 2019-11-30 | Disposition: A | Discharge status: Home / Self Care | Payer: Medicare Other | Source: Ambulatory Visit | Attending: Orthopedic Surgery | Admitting: Orthopedic Surgery

## 2019-11-30 DIAGNOSIS — Z01810 Encounter for preprocedural cardiovascular examination: Secondary | ICD-10-CM

## 2019-11-30 DIAGNOSIS — Z01818 Encounter for other preprocedural examination: Secondary | ICD-10-CM | POA: Diagnosis not present

## 2019-11-30 LAB — CBC WITH DIFFERENTIAL/PLATELET
Abs Immature Granulocytes: 0.05 10*3/uL (ref 0.00–0.07)
Basophils Absolute: 0 10*3/uL (ref 0.0–0.1)
Basophils Relative: 0 %
Eosinophils Absolute: 0.2 10*3/uL (ref 0.0–0.5)
Eosinophils Relative: 3 %
HCT: 47.8 % (ref 39.0–52.0)
Hemoglobin: 16 g/dL (ref 13.0–17.0)
Immature Granulocytes: 1 %
Lymphocytes Relative: 29 %
Lymphs Abs: 1.7 10*3/uL (ref 0.7–4.0)
MCH: 28.4 pg (ref 26.0–34.0)
MCHC: 33.5 g/dL (ref 30.0–36.0)
MCV: 84.8 fL (ref 80.0–100.0)
Monocytes Absolute: 0.8 10*3/uL (ref 0.1–1.0)
Monocytes Relative: 13 %
Neutro Abs: 3.1 10*3/uL (ref 1.7–7.7)
Neutrophils Relative %: 54 %
Platelets: 172 10*3/uL (ref 150–400)
RBC: 5.64 MIL/uL (ref 4.22–5.81)
RDW: 12.6 % (ref 11.5–15.5)
WBC: 5.7 10*3/uL (ref 4.0–10.5)
nRBC: 0 % (ref 0.0–0.2)

## 2019-11-30 LAB — URINALYSIS, ROUTINE W REFLEX MICROSCOPIC
Bacteria, UA: NONE SEEN
Bilirubin Urine: NEGATIVE
Glucose, UA: >=500 mg/dL — AB
Hgb urine dipstick: NEGATIVE
Ketones, ur: NEGATIVE mg/dL
Leukocytes,Ua: NEGATIVE
Nitrite: NEGATIVE
Protein, ur: NEGATIVE mg/dL
Specific Gravity, Urine: 1.039 — ABNORMAL HIGH (ref 1.005–1.030)
Squamous Epithelial / HPF: NONE SEEN (ref 0–5)
pH: 6 (ref 5.0–8.0)

## 2019-11-30 LAB — COMPREHENSIVE METABOLIC PANEL
ALT: 25 U/L (ref 0–44)
AST: 15 U/L (ref 15–41)
Albumin: 4.1 g/dL (ref 3.5–5.0)
Alkaline Phosphatase: 47 U/L (ref 38–126)
Anion gap: 12 (ref 5–15)
BUN: 18 mg/dL (ref 6–20)
CO2: 26 mmol/L (ref 22–32)
Calcium: 9.4 mg/dL (ref 8.9–10.3)
Chloride: 98 mmol/L (ref 98–111)
Creatinine, Ser: 0.94 mg/dL (ref 0.61–1.24)
GFR calc Af Amer: >60 mL/min (ref >60)
GFR calc non Af Amer: >60 mL/min (ref >60)
Glucose, Bld: 122 mg/dL — ABNORMAL HIGH (ref 70–99)
Potassium: 4 mmol/L (ref 3.5–5.1)
Sodium: 136 mmol/L (ref 135–145)
Total Bilirubin: 0.6 mg/dL (ref 0.3–1.2)
Total Protein: 7.1 g/dL (ref 6.5–8.1)

## 2019-11-30 LAB — TYPE AND SCREEN
ABO/RH(D): A POS
Antibody Screen: NEGATIVE

## 2019-11-30 LAB — SARS CORONAVIRUS 2 (TAT 6-24 HRS): SARS Coronavirus 2: NEGATIVE

## 2019-11-30 LAB — SURGICAL PCR SCREEN
MRSA, PCR: NEGATIVE
Staphylococcus aureus: NEGATIVE

## 2019-12-01 ENCOUNTER — Other Ambulatory Visit: Payer: Medicare Other

## 2019-12-05 ENCOUNTER — Other Ambulatory Visit: Payer: Medicare Other

## 2019-12-05 ENCOUNTER — Other Ambulatory Visit: Payer: Self-pay | Admitting: Orthopedic Surgery

## 2019-12-05 MED ORDER — TRANEXAMIC ACID-NACL 1000-0.7 MG/100ML-% IV SOLN
1000.0000 mg | INTRAVENOUS | Status: AC
  Administered 2019-12-06: 1000 mg via INTRAVENOUS

## 2019-12-05 MED ORDER — CLINDAMYCIN PHOSPHATE 900 MG/50ML IV SOLN
900.0000 mg | INTRAVENOUS | Status: AC
  Administered 2019-12-06: 900 mg via INTRAVENOUS

## 2019-12-06 ENCOUNTER — Encounter: Payer: Self-pay | Admitting: Orthopedic Surgery

## 2019-12-06 ENCOUNTER — Ambulatory Visit: Payer: Medicare Other | Admitting: Anesthesiology

## 2019-12-06 ENCOUNTER — Inpatient Hospital Stay: Admit: 2019-12-06 | Payer: Medicare Other | Admitting: Orthopedic Surgery

## 2019-12-06 ENCOUNTER — Encounter
Admission: RE | Disposition: A | Discharge status: Home / Self Care | Payer: Self-pay | Source: Home / Self Care | Attending: Orthopedic Surgery

## 2019-12-06 ENCOUNTER — Ambulatory Visit: Payer: Medicare Other

## 2019-12-06 ENCOUNTER — Ambulatory Visit
Admission: RE | Admit: 2019-12-06 | Discharge: 2019-12-06 | Disposition: A | Discharge status: Home / Self Care | Payer: Medicare Other | Attending: Orthopedic Surgery | Admitting: Orthopedic Surgery

## 2019-12-06 ENCOUNTER — Other Ambulatory Visit: Payer: Self-pay

## 2019-12-06 DIAGNOSIS — Z888 Allergy status to other drugs, medicaments and biological substances status: Secondary | ICD-10-CM | POA: Insufficient documentation

## 2019-12-06 DIAGNOSIS — Z880 Allergy status to penicillin: Secondary | ICD-10-CM | POA: Insufficient documentation

## 2019-12-06 DIAGNOSIS — E119 Type 2 diabetes mellitus without complications: Secondary | ICD-10-CM | POA: Insufficient documentation

## 2019-12-06 DIAGNOSIS — M1711 Unilateral primary osteoarthritis, right knee: Secondary | ICD-10-CM | POA: Insufficient documentation

## 2019-12-06 DIAGNOSIS — Z833 Family history of diabetes mellitus: Secondary | ICD-10-CM | POA: Insufficient documentation

## 2019-12-06 DIAGNOSIS — R2689 Other abnormalities of gait and mobility: Secondary | ICD-10-CM | POA: Diagnosis not present

## 2019-12-06 DIAGNOSIS — F3181 Bipolar II disorder: Secondary | ICD-10-CM | POA: Insufficient documentation

## 2019-12-06 DIAGNOSIS — G8918 Other acute postprocedural pain: Secondary | ICD-10-CM

## 2019-12-06 DIAGNOSIS — Z7984 Long term (current) use of oral hypoglycemic drugs: Secondary | ICD-10-CM | POA: Insufficient documentation

## 2019-12-06 DIAGNOSIS — Z87891 Personal history of nicotine dependence: Secondary | ICD-10-CM | POA: Insufficient documentation

## 2019-12-06 DIAGNOSIS — Z96651 Presence of right artificial knee joint: Secondary | ICD-10-CM

## 2019-12-06 DIAGNOSIS — E785 Hyperlipidemia, unspecified: Secondary | ICD-10-CM | POA: Insufficient documentation

## 2019-12-06 DIAGNOSIS — K219 Gastro-esophageal reflux disease without esophagitis: Secondary | ICD-10-CM | POA: Diagnosis not present

## 2019-12-06 DIAGNOSIS — Z79899 Other long term (current) drug therapy: Secondary | ICD-10-CM | POA: Diagnosis not present

## 2019-12-06 DIAGNOSIS — G8929 Other chronic pain: Secondary | ICD-10-CM | POA: Diagnosis not present

## 2019-12-06 DIAGNOSIS — M549 Dorsalgia, unspecified: Secondary | ICD-10-CM | POA: Diagnosis not present

## 2019-12-06 DIAGNOSIS — M6281 Muscle weakness (generalized): Secondary | ICD-10-CM | POA: Insufficient documentation

## 2019-12-06 HISTORY — PX: TOTAL KNEE ARTHROPLASTY: SHX125

## 2019-12-06 LAB — COMPREHENSIVE METABOLIC PANEL
ALT: 22 U/L (ref 0–44)
AST: 17 U/L (ref 15–41)
Albumin: 4.2 g/dL (ref 3.5–5.0)
Alkaline Phosphatase: 53 U/L (ref 38–126)
Anion gap: 7 (ref 5–15)
BUN: 15 mg/dL (ref 6–20)
CO2: 31 mmol/L (ref 22–32)
Calcium: 9.3 mg/dL (ref 8.9–10.3)
Chloride: 98 mmol/L (ref 98–111)
Creatinine, Ser: 1.08 mg/dL (ref 0.61–1.24)
GFR calc Af Amer: >60 mL/min (ref >60)
GFR calc non Af Amer: >60 mL/min (ref >60)
Glucose, Bld: 140 mg/dL — ABNORMAL HIGH (ref 70–99)
Potassium: 4.4 mmol/L (ref 3.5–5.1)
Sodium: 136 mmol/L (ref 135–145)
Total Bilirubin: 0.8 mg/dL (ref 0.3–1.2)
Total Protein: 7.1 g/dL (ref 6.5–8.1)

## 2019-12-06 LAB — CBC WITH DIFFERENTIAL/PLATELET
Abs Immature Granulocytes: 0.04 10*3/uL (ref 0.00–0.07)
Basophils Absolute: 0 10*3/uL (ref 0.0–0.1)
Basophils Relative: 1 %
Eosinophils Absolute: 0.2 10*3/uL (ref 0.0–0.5)
Eosinophils Relative: 3 %
HCT: 48.9 % (ref 39.0–52.0)
Hemoglobin: 16.4 g/dL (ref 13.0–17.0)
Immature Granulocytes: 1 %
Lymphocytes Relative: 27 %
Lymphs Abs: 1.6 10*3/uL (ref 0.7–4.0)
MCH: 28.2 pg (ref 26.0–34.0)
MCHC: 33.5 g/dL (ref 30.0–36.0)
MCV: 84.2 fL (ref 80.0–100.0)
Monocytes Absolute: 0.6 10*3/uL (ref 0.1–1.0)
Monocytes Relative: 10 %
Neutro Abs: 3.7 10*3/uL (ref 1.7–7.7)
Neutrophils Relative %: 58 %
Platelets: 180 10*3/uL (ref 150–400)
RBC: 5.81 MIL/uL (ref 4.22–5.81)
RDW: 12.4 % (ref 11.5–15.5)
WBC: 6.2 10*3/uL (ref 4.0–10.5)
nRBC: 0 % (ref 0.0–0.2)

## 2019-12-06 LAB — APTT: aPTT: 29 seconds (ref 24–36)

## 2019-12-06 LAB — PROTIME-INR
INR: 1 (ref 0.8–1.2)
Prothrombin Time: 12.8 seconds (ref 11.4–15.2)

## 2019-12-06 LAB — ABO/RH: ABO/RH(D): A POS

## 2019-12-06 LAB — GLUCOSE, CAPILLARY
Glucose-Capillary: 127 mg/dL — ABNORMAL HIGH (ref 70–99)
Glucose-Capillary: 117 mg/dL — ABNORMAL HIGH (ref 70–99)
Glucose-Capillary: 104 mg/dL — ABNORMAL HIGH (ref 70–99)

## 2019-12-06 SURGERY — ARTHROPLASTY, KNEE, TOTAL
Anesthesia: Spinal | Site: Knee | Laterality: Right

## 2019-12-06 SURGERY — ARTHROPLASTY, KNEE, TOTAL
Anesthesia: Choice | Site: Knee | Laterality: Right

## 2019-12-06 MED ORDER — EPINEPHRINE PF 1 MG/ML IJ SOLN
INTRAMUSCULAR | Status: AC
  Filled 2019-12-06: qty 1

## 2019-12-06 MED ORDER — LIDOCAINE HCL (PF) 2 % IJ SOLN
INTRAMUSCULAR | Status: AC
  Filled 2019-12-06: qty 10

## 2019-12-06 MED ORDER — TRANEXAMIC ACID-NACL 1000-0.7 MG/100ML-% IV SOLN
INTRAVENOUS | Status: AC
  Filled 2019-12-06: qty 100

## 2019-12-06 MED ORDER — PROPOFOL 10 MG/ML IV BOLUS
INTRAVENOUS | Status: AC
  Filled 2019-12-06: qty 20

## 2019-12-06 MED ORDER — MORPHINE SULFATE (PF) 10 MG/ML IV SOLN
INTRAVENOUS | Status: AC
  Filled 2019-12-06: qty 1

## 2019-12-06 MED ORDER — SODIUM CHLORIDE 0.9 % IV BOLUS
250.0000 mL | Freq: Once | INTRAVENOUS | Status: AC
  Administered 2019-12-06: 250 mL via INTRAVENOUS

## 2019-12-06 MED ORDER — GLYCOPYRROLATE 0.2 MG/ML IJ SOLN
INTRAMUSCULAR | Status: AC
  Filled 2019-12-06: qty 1

## 2019-12-06 MED ORDER — BUPIVACAINE HCL (PF) 0.25 % IJ SOLN
INTRAMUSCULAR | Status: AC
  Filled 2019-12-06: qty 30

## 2019-12-06 MED ORDER — KETAMINE HCL 50 MG/ML IJ SOLN
INTRAMUSCULAR | Status: AC
  Filled 2019-12-06: qty 10

## 2019-12-06 MED ORDER — SODIUM CHLORIDE 0.9 % IV SOLN
INTRAVENOUS | Status: DC | PRN
  Administered 2019-12-06: 60 mL

## 2019-12-06 MED ORDER — INSULIN ASPART 100 UNIT/ML ~~LOC~~ SOLN: 0.0000 [IU] | Freq: Three times a day (TID) | SUBCUTANEOUS | Status: DC

## 2019-12-06 MED ORDER — MIDAZOLAM HCL 2 MG/2ML IJ SOLN
INTRAMUSCULAR | Status: AC
  Filled 2019-12-06: qty 2

## 2019-12-06 MED ORDER — PROPOFOL 500 MG/50ML IV EMUL
INTRAVENOUS | Status: DC | PRN
  Administered 2019-12-06: 50 ug/kg/min via INTRAVENOUS

## 2019-12-06 MED ORDER — ONDANSETRON HCL 4 MG/2ML IJ SOLN
INTRAMUSCULAR | Status: AC
  Filled 2019-12-06: qty 2

## 2019-12-06 MED ORDER — GABAPENTIN 300 MG PO CAPS
ORAL_CAPSULE | ORAL | Status: AC
  Administered 2019-12-06: 600 mg via ORAL
  Filled 2019-12-06: qty 2

## 2019-12-06 MED ORDER — ACETAMINOPHEN 10 MG/ML IV SOLN
INTRAVENOUS | Status: AC
  Filled 2019-12-06: qty 100

## 2019-12-06 MED ORDER — FENTANYL CITRATE (PF) 100 MCG/2ML IJ SOLN
25.0000 ug | INTRAMUSCULAR | Status: DC | PRN
  Administered 2019-12-06: 50 ug via INTRAVENOUS

## 2019-12-06 MED ORDER — KETAMINE HCL 50 MG/ML IJ SOLN
INTRAMUSCULAR | Status: DC | PRN
  Administered 2019-12-06: 50 mg via INTRAVENOUS

## 2019-12-06 MED ORDER — ACETAMINOPHEN 10 MG/ML IV SOLN
INTRAVENOUS | Status: DC | PRN
  Administered 2019-12-06: 1000 mg via INTRAVENOUS

## 2019-12-06 MED ORDER — ENOXAPARIN SODIUM 40 MG/0.4ML ~~LOC~~ SOLN: 40.0000 mg | SUBCUTANEOUS | 0 refills | Status: DC

## 2019-12-06 MED ORDER — FENTANYL CITRATE (PF) 100 MCG/2ML IJ SOLN
INTRAMUSCULAR | Status: AC
  Filled 2019-12-06: qty 2

## 2019-12-06 MED ORDER — GLYCOPYRROLATE 0.2 MG/ML IJ SOLN
INTRAMUSCULAR | Status: DC | PRN
  Administered 2019-12-06: .2 mg via INTRAVENOUS

## 2019-12-06 MED ORDER — CELECOXIB 200 MG PO CAPS
ORAL_CAPSULE | ORAL | Status: AC
  Administered 2019-12-06: 200 mg via ORAL
  Filled 2019-12-06: qty 1

## 2019-12-06 MED ORDER — OXYCODONE HCL 5 MG PO TABS
5.0000 mg | ORAL_TABLET | ORAL | Status: DC | PRN
  Administered 2019-12-06: 5 mg via ORAL
  Filled 2019-12-06 (×2): qty 2

## 2019-12-06 MED ORDER — KETOROLAC TROMETHAMINE 30 MG/ML IJ SOLN
INTRAMUSCULAR | Status: AC
  Filled 2019-12-06: qty 1

## 2019-12-06 MED ORDER — LACTATED RINGERS IV BOLUS: 500.0000 mL | Freq: Once | INTRAVENOUS | Status: DC

## 2019-12-06 MED ORDER — CLINDAMYCIN PHOSPHATE 900 MG/50ML IV SOLN
INTRAVENOUS | Status: AC
  Filled 2019-12-06: qty 50

## 2019-12-06 MED ORDER — ONDANSETRON HCL 4 MG/2ML IJ SOLN: 4.0000 mg | Freq: Once | INTRAMUSCULAR | Status: DC | PRN

## 2019-12-06 MED ORDER — OXYCODONE HCL 5 MG PO TABS
ORAL_TABLET | ORAL | Status: AC
  Filled 2019-12-06: qty 2

## 2019-12-06 MED ORDER — PROPOFOL 500 MG/50ML IV EMUL
INTRAVENOUS | Status: AC
  Filled 2019-12-06: qty 50

## 2019-12-06 MED ORDER — LIDOCAINE HCL (PF) 2 % IJ SOLN
INTRAMUSCULAR | Status: DC | PRN
  Administered 2019-12-06: 100 mg

## 2019-12-06 MED ORDER — MIDAZOLAM HCL 5 MG/5ML IJ SOLN
INTRAMUSCULAR | Status: DC | PRN
  Administered 2019-12-06: 2 mg via INTRAVENOUS

## 2019-12-06 MED ORDER — OXYCODONE HCL 5 MG PO TABS
10.0000 mg | ORAL_TABLET | ORAL | Status: DC | PRN
  Filled 2019-12-06: qty 3

## 2019-12-06 MED ORDER — CELECOXIB 200 MG PO CAPS: 200.0000 mg | ORAL_CAPSULE | Freq: Two times a day (BID) | ORAL | Status: DC

## 2019-12-06 MED ORDER — ONDANSETRON 4 MG PO TBDP: 4.0000 mg | ORAL_TABLET | Freq: Three times a day (TID) | ORAL | 0 refills | Status: DC | PRN

## 2019-12-06 MED ORDER — CLINDAMYCIN PHOSPHATE 600 MG/50ML IV SOLN: 600.0000 mg | Freq: Four times a day (QID) | INTRAVENOUS | Status: DC

## 2019-12-06 MED ORDER — GABAPENTIN 300 MG PO CAPS: 600.0000 mg | ORAL_CAPSULE | Freq: Three times a day (TID) | ORAL | Status: DC

## 2019-12-06 MED ORDER — BUPIVACAINE-EPINEPHRINE (PF) 0.25% -1:200000 IJ SOLN
INTRAMUSCULAR | Status: DC | PRN
  Administered 2019-12-06: 30 mL

## 2019-12-06 MED ORDER — SODIUM CHLORIDE 0.9 % IV SOLN: INTRAVENOUS | Status: DC

## 2019-12-06 MED ORDER — BUPIVACAINE IN DEXTROSE 0.75-8.25 % IT SOLN
INTRATHECAL | Status: DC | PRN
  Administered 2019-12-06: 1.3 mL via INTRATHECAL

## 2019-12-06 MED ORDER — SODIUM CHLORIDE FLUSH 0.9 % IV SOLN
INTRAVENOUS | Status: AC
  Filled 2019-12-06: qty 70

## 2019-12-06 MED ORDER — NEOMYCIN-POLYMYXIN B GU 40-200000 IR SOLN
Status: AC
  Filled 2019-12-06: qty 20

## 2019-12-06 MED ORDER — ONDANSETRON HCL 4 MG/2ML IJ SOLN
INTRAMUSCULAR | Status: DC | PRN
  Administered 2019-12-06: 4 mg via INTRAVENOUS

## 2019-12-06 MED ORDER — PHENYLEPHRINE HCL (PRESSORS) 10 MG/ML IV SOLN
INTRAVENOUS | Status: AC
  Filled 2019-12-06: qty 1

## 2019-12-06 MED ORDER — FENTANYL CITRATE (PF) 100 MCG/2ML IJ SOLN
INTRAMUSCULAR | Status: DC | PRN
  Administered 2019-12-06 (×2): 50 ug via INTRAVENOUS

## 2019-12-06 MED ORDER — CHLORHEXIDINE GLUCONATE 4 % EX LIQD
60.0000 mL | Freq: Once | CUTANEOUS | Status: AC
  Administered 2019-12-06: 4 via TOPICAL

## 2019-12-06 MED ORDER — BUPIVACAINE LIPOSOME 1.3 % IJ SUSP
INTRAMUSCULAR | Status: AC
  Filled 2019-12-06: qty 20

## 2019-12-06 MED ORDER — CLINDAMYCIN PHOSPHATE 600 MG/50ML IV SOLN
INTRAVENOUS | Status: AC
  Administered 2019-12-06: 600 mg via INTRAVENOUS
  Filled 2019-12-06: qty 50

## 2019-12-06 MED ORDER — OXYCODONE HCL 5 MG PO TABS
ORAL_TABLET | ORAL | Status: AC
  Administered 2019-12-06: 10 mg via ORAL
  Filled 2019-12-06: qty 1

## 2019-12-06 MED ORDER — OXYCODONE HCL 5 MG PO TABS: 5.0000 mg | ORAL_TABLET | Freq: Three times a day (TID) | ORAL | 0 refills | Status: DC | PRN

## 2019-12-06 SURGICAL SUPPLY — 73 items
BLADE SAGITTAL 25.0X1.19X90 (BLADE) ×2 IMPLANT
BLADE SAGITTAL 25.0X1.19X90MM (BLADE) ×1
BLOCK CUTTING FEMUR 4 RT (MISCELLANEOUS) ×3 IMPLANT
BLOCK CUTTING TIBIAL 4 RT (MISCELLANEOUS) ×3 IMPLANT
BLOCK CUTTING TIBIAL 4 RT MIS (MISCELLANEOUS) ×3 IMPLANT
BNDG ELASTIC 6X5.8 VLCR STR LF (GAUZE/BANDAGES/DRESSINGS) ×3 IMPLANT
CANISTER SUCT 1200ML W/VALVE (MISCELLANEOUS) ×3 IMPLANT
CANISTER SUCT 3000ML PPV (MISCELLANEOUS) ×6 IMPLANT
CANISTER WOUND CARE 500ML ATS (WOUND CARE) ×3 IMPLANT
CEMENT HV SMART SET (Cement) ×6 IMPLANT
CHLORAPREP W/TINT 26 (MISCELLANEOUS) ×6 IMPLANT
COOLER POLAR GLACIER W/PUMP (MISCELLANEOUS) ×3 IMPLANT
COVER WAND RF STERILE (DRAPES) ×3 IMPLANT
CUFF TOURN SGL QUICK 24 (TOURNIQUET CUFF) ×2
CUFF TOURN SGL QUICK 30 (TOURNIQUET CUFF)
CUFF TRNQT CYL 24X4X16.5-23 (TOURNIQUET CUFF) ×1 IMPLANT
CUFF TRNQT CYL 30X4X21-28X (TOURNIQUET CUFF) IMPLANT
DRAPE 3/4 80X56 (DRAPES) ×6 IMPLANT
ELECT CAUTERY BLADE 6.4 (BLADE) ×3 IMPLANT
ELECT REM PT RETURN 9FT ADLT (ELECTROSURGICAL) ×3
ELECTRODE REM PT RTRN 9FT ADLT (ELECTROSURGICAL) ×1 IMPLANT
FEMORAL COMP SZ4 RIGHT SPHERE (Femur) ×3 IMPLANT
FEMUR BONE MODEL (MISCELLANEOUS) ×3 IMPLANT
GAUZE SPONGE 4X4 12PLY STRL (GAUZE/BANDAGES/DRESSINGS) ×3 IMPLANT
GAUZE XEROFORM 1X8 LF (GAUZE/BANDAGES/DRESSINGS) ×3 IMPLANT
GLOVE BIOGEL PI IND STRL 9 (GLOVE) ×1 IMPLANT
GLOVE BIOGEL PI INDICATOR 9 (GLOVE) ×2
GLOVE INDICATOR 8.0 STRL GRN (GLOVE) ×3 IMPLANT
GLOVE SURG ORTHO 8.0 STRL STRW (GLOVE) ×3 IMPLANT
GLOVE SURG SYN 9.0  PF PI (GLOVE) ×2
GLOVE SURG SYN 9.0 PF PI (GLOVE) ×1 IMPLANT
GOWN SRG 2XL LVL 4 RGLN SLV (GOWNS) ×1 IMPLANT
GOWN STRL NON-REIN 2XL LVL4 (GOWNS) ×2
GOWN STRL REUS W/ TWL LRG LVL3 (GOWN DISPOSABLE) ×1 IMPLANT
GOWN STRL REUS W/ TWL XL LVL3 (GOWN DISPOSABLE) ×1 IMPLANT
GOWN STRL REUS W/TWL LRG LVL3 (GOWN DISPOSABLE) ×2
GOWN STRL REUS W/TWL XL LVL3 (GOWN DISPOSABLE) ×2
HOLDER FOLEY CATH W/STRAP (MISCELLANEOUS) ×3 IMPLANT
HOOD PEEL AWAY FLYTE STAYCOOL (MISCELLANEOUS) ×6 IMPLANT
IMMOB KNEE 24 THIGH 24 443303 (SOFTGOODS) ×3 IMPLANT
INSERT TIBIAL RIGHT SZ4 (Insert) ×3 IMPLANT
KIT PREVENA INCISION MGT 13 (CANNISTER) ×3 IMPLANT
KIT PREVENA INCISION MGT20CM45 (CANNISTER) IMPLANT
KIT TURNOVER KIT A (KITS) ×3 IMPLANT
NDL SAFETY ECLIPSE 18X1.5 (NEEDLE) ×1 IMPLANT
NEEDLE HYPO 18GX1.5 SHARP (NEEDLE) ×2
NEEDLE SPNL 18GX3.5 QUINCKE PK (NEEDLE) ×3 IMPLANT
NEEDLE SPNL 20GX3.5 QUINCKE YW (NEEDLE) ×3 IMPLANT
NS IRRIG 1000ML POUR BTL (IV SOLUTION) ×3 IMPLANT
PACK TOTAL KNEE (MISCELLANEOUS) ×3 IMPLANT
PAD WRAPON POLAR KNEE (MISCELLANEOUS) ×1 IMPLANT
PATELLA RESURFACING MEDACTA 02 (Bone Implant) ×3 IMPLANT
PENCIL SMOKE EVACUATOR COATED (MISCELLANEOUS) ×3 IMPLANT
PULSAVAC PLUS IRRIG FAN TIP (DISPOSABLE) ×3
SCALPEL PROTECTED #10 DISP (BLADE) ×6 IMPLANT
SOL .9 NS 3000ML IRR  AL (IV SOLUTION) ×2
SOL .9 NS 3000ML IRR UROMATIC (IV SOLUTION) ×1 IMPLANT
STAPLER SKIN PROX 35W (STAPLE) ×3 IMPLANT
STEM EXTENSION 11MMX30MM (Stem) ×3 IMPLANT
SUCTION FRAZIER HANDLE 10FR (MISCELLANEOUS) ×2
SUCTION TUBE FRAZIER 10FR DISP (MISCELLANEOUS) ×1 IMPLANT
SUT DVC 2 QUILL PDO  T11 36X36 (SUTURE) ×2
SUT DVC 2 QUILL PDO T11 36X36 (SUTURE) ×1 IMPLANT
SUT ETHIBOND 2 V 37 (SUTURE) IMPLANT
SUT V-LOC 90 ABS DVC 3-0 CL (SUTURE) ×3 IMPLANT
SYR 20ML LL LF (SYRINGE) ×3 IMPLANT
SYR 50ML LL SCALE MARK (SYRINGE) ×6 IMPLANT
TIBIAL TRAY FIXED 4 MEDACTA 02 (Joint) ×3 IMPLANT
TIP FAN IRRIG PULSAVAC PLUS (DISPOSABLE) ×1 IMPLANT
TOWEL OR 17X26 4PK STRL BLUE (TOWEL DISPOSABLE) ×3 IMPLANT
TOWER CARTRIDGE SMART MIX (DISPOSABLE) ×3 IMPLANT
TRAY FOLEY MTR SLVR 16FR STAT (SET/KITS/TRAYS/PACK) ×3 IMPLANT
WRAPON POLAR PAD KNEE (MISCELLANEOUS) ×3

## 2019-12-06 NOTE — Anesthesia Postprocedure Evaluation (Signed)
Anesthesia Post Note  Patient: Daniel [name]  Procedure(s) Performed: TOTAL KNEE ARTHROPLASTY (Right Knee)  Patient location during evaluation: PACU Anesthesia Type: Spinal Level of consciousness: oriented and awake and alert Pain management: pain level controlled Vital Signs Assessment: post-procedure vital signs reviewed and stable Respiratory status: spontaneous breathing, respiratory function stable and nonlabored ventilation Cardiovascular status: blood pressure returned to baseline and stable Postop Assessment: no headache, no backache and able to ambulate Anesthetic complications: no     Last Vitals:  Vitals:   12/06/19 1142 12/06/19 1250  BP: 104/65 99/71  Pulse: 62 67  Resp: 12 18  Temp:  36.4 C  SpO2: 100%     Last Pain:  Vitals:   12/06/19 1250  TempSrc: Temporal  PainSc: 6                  [name]

## 2019-12-06 NOTE — Progress Notes (Signed)
Spoke with Janna Arch at [facility], [facility] will f/u with pt the day after pt d/c from [facility].  Informed pt.

## 2019-12-06 NOTE — Anesthesia Preprocedure Evaluation (Addendum)
Anesthesia Evaluation  Patient identified by MRN, date of birth, ID band Patient awake    Reviewed: Allergy & Precautions, NPO status , Patient's Chart, lab work & pertinent test results  History of Anesthesia Complications Negative for: history of anesthetic complications  Airway Mallampati: III  TM Distance: >3 FB Neck ROM: Full    Dental no notable dental hx.    Pulmonary neg sleep apnea, neg COPD, former smoker,    breath sounds clear to auscultation- rhonchi (-) wheezing      Cardiovascular Exercise Tolerance: Good (-) hypertension(-) CAD, (-) Past MI, (-) Cardiac Stents and (-) CABG  Rhythm:Regular Rate:Normal - Systolic murmurs and - Diastolic murmurs    Neuro/Psych  Headaches, neg Seizures PSYCHIATRIC DISORDERS Depression Bipolar Disorder    GI/Hepatic Neg liver ROS, GERD  ,  Endo/Other  diabetes, Oral Hypoglycemic Agents  Renal/GU negative Renal ROS     Musculoskeletal  (+) Arthritis ,   Abdominal (+) - obese,   Peds  Hematology negative hematology ROS (+)   Anesthesia Other Findings Past Medical History: No date: Bipolar 1 disorder (HCC) No date: Chronic back pain No date: DDD (degenerative disc disease), cervical No date: Depression No date: Diabetes mellitus without complication (HCC) No date: ED (erectile dysfunction) No date: Elevated lipids No date: GERD (gastroesophageal reflux disease) No date: Headache No date: Insomnia No date: Seasonal allergies No date: Wheezing   Reproductive/Obstetrics                             Lab Results  Component Value Date   WBC 5.7 11/30/2019   HGB 16.0 11/30/2019   HCT 47.8 11/30/2019   MCV 84.8 11/30/2019   PLT 172 11/30/2019    Anesthesia Physical Anesthesia Plan  ASA: II  Anesthesia Plan: Spinal   Post-op Pain Management:    Induction:   PONV Risk Score and Plan: 1 and Propofol infusion  Airway Management  Planned: Natural Airway  Additional Equipment:   Intra-op Plan:   Post-operative Plan:   Informed Consent: I have reviewed the patients History and Physical, chart, labs and discussed the procedure including the risks, benefits and alternatives for the proposed anesthesia with the patient or authorized representative who has indicated his/her understanding and acceptance.     Dental advisory given  Plan Discussed with: CRNA and Anesthesiologist  Anesthesia Plan Comments:         Anesthesia Quick Evaluation

## 2019-12-06 NOTE — Anesthesia Procedure Notes (Signed)
Spinal  Patient location during procedure: OR Start time: 12/06/2019 7:23 AM End time: 12/06/2019 7:26 AM Staffing Performed: resident/CRNA  Anesthesiologist: Emmie Niemann, MD Resident/CRNA: Rolla Plate, CRNA Preanesthetic Checklist Completed: patient identified, IV checked, site marked, risks and benefits discussed, surgical consent, monitors and equipment checked, pre-op evaluation and timeout performed Spinal Block Patient position: sitting Prep: ChloraPrep Patient monitoring: heart rate, continuous pulse ox and blood pressure Approach: midline Location: L4-5 Injection technique: single-shot Needle Needle type: Introducer and Pencil-Tip  Needle gauge: 24 G Needle length: 9 cm Additional Notes Negative paresthesia. Negative blood return. Positive free-flowing CSF. Expiration date of kit checked and confirmed. Patient tolerated procedure well, without complications.

## 2019-12-06 NOTE — Op Note (Signed)
12/06/2019  9:54 AM  PATIENT:  Daniel [name]  48 y.o. male  PRE-OPERATIVE DIAGNOSIS:  PRIMARY OSTEOARTHRITIS OF RIGHT KNEE  POST-OPERATIVE DIAGNOSIS:  PRIMARY OSTEOARTHRITIS OF RIGHT KNEE  PROCEDURE:  Procedure(s): TOTAL KNEE ARTHROPLASTY (Right)  SURGEON: Laurene Footman, MD  ASSISTANTS: None  ANESTHESIA:   spinal  EBL:  Total I/O In: 1130 [I.V.:1100; IV Piggyback:30] Out: 50 [Blood:50]  BLOOD ADMINISTERED:none  DRAINS: Incisional wound VAC   LOCAL MEDICATIONS USED:  MARCAINE    and BUPIVICAINE   SPECIMEN:  No Specimen  DISPOSITION OF SPECIMEN:  N/A  COUNTS:  YES  TOURNIQUET:   Total Tourniquet Time Documented: Thigh (Right) - 26 minutes Total: Thigh (Right) - 26 minutes   IMPLANTS: Medacta 4 femur right, 4 tibia with short stem and 13 mm insert, size 2 patella, all components cemented  DICTATION: .Occupational hygienist Dictation  patient was brought to the operating room and after  spinal anesthesia was obtained, the rightleg was prepped and draped in the usual sterile fashion. After patient identification and timeout procedures were completed, midline skin incision was made followed by medial parapatellar arthrotomywith bone-on-bone medial compartment osteoarthritis advanced patellofemoral arthritis and moderate lateral compartment arthritis,partial synovectomy was also carried out. The ACL and fat pad were excised off the anterior into the meniscus in the proximal tibia cutting guide from the [facility] was applied and the proximal tibia cut carried out. The distal femoral cut was carried out in a similar fashion.  The 4 femoralcutting guide applied with anterior posterior and chamfer cuts made. The posterior horns of the menisci were removed at this point. The4tibia baseplate trial was placed pinned into position and proximal tibial preparation carried out with drilling hand reaming and the keel punch followed by placement of the30femur and sizing  the tibial insert size 65millimetergave the best fit with stability and full extension. The distal femoral drill holes were made in the notch cut for the trochlear groove was then carried out with trials were then removed the patella was cut using the patellar cutting guide and it sized to a size2afterdrill holes have been made.At this point the above local was infiltrated in the periarticular tissues and periosteum. The tourniquet was raised at this time.  The knee was irrigated with pulsatile lavage and the bony surfaces dried the tibial component was cemented into place first. Excess cement was removed and the polyethylene insert placed with a torque screw placed with a torque screwdriver tightened. The distal femoral component was placed and the knee was held in extension as the patellar button was clamped into place. After the cement was set excess cement was removed and the knee was again irrigated thoroughly thoroughly irrigated. The tourniquet was let down and hemostasis checkedwithelectrocautery.The arthrotomy was repaired with#2 Ethibond along witha heavy Quill suture, followed by 3-0 V lock subcuticular closure,skin staples, incisional wound VAC and cooling pad applied  PLAN OF CARE: Discharge to home after PACU  PATIENT DISPOSITION:  PACU - hemodynamically stable.

## 2019-12-06 NOTE — Transfer of Care (Signed)
Immediate Anesthesia Transfer of Care Note  Patient: Daniel [name]  Procedure(s) Performed: TOTAL KNEE ARTHROPLASTY (Right Knee)  Patient Location: PACU  Anesthesia Type:Spinal  Level of Consciousness: awake  Airway & Oxygen Therapy: Patient Spontanous Breathing and Patient connected to face mask oxygen  Post-op Assessment: Report given to RN and Post -op Vital signs reviewed and stable  Post vital signs: Reviewed  Last Vitals:  Vitals Value Taken Time  BP 82/55 12/06/19 0940  Temp    Pulse 83 12/06/19 0940  Resp 10 12/06/19 0940  SpO2 100 % 12/06/19 0940  Vitals shown include unvalidated device data.  Last Pain:  Vitals:   12/06/19 8916  TempSrc: Tympanic  PainSc: 4       Patients Stated Pain Goal: 0 (94/50/38 8828)  Complications: No apparent anesthesia complications

## 2019-12-06 NOTE — Evaluation (Signed)
Physical Therapy Evaluation Patient Details Name: Daniel [name] MRN: [id number] DOB: Nov 14, 1971 Today's Date: 12/06/2019   History of Present Illness  Pt is a 48 yo M with PMH that includes GERD, depression bipolar disorder, chronic back pain, DDD, DM, and OA of the R knee.  Pt is s/p elective R TKA.    Clinical Impression  Pt presented with deficits in strength, transfers, gait, balance, R knee ROM, and activity tolerance but overall performed very well during the session.  Pt was able to get in and out of bed easily with no assistance.  Pt was steady with transfers and gait with RLE KI donned and was able to ascend and descend 4 steps with one rail with good control.  Pt will benefit from HHPT services upon discharge to safely address above deficits for decreased caregiver assistance and eventual return to PLOF.      Follow Up Recommendations Home health PT;Supervision - Intermittent    Equipment Recommendations  Rolling walker with 5" wheels;3in1 (PT)    Recommendations for Other Services       Precautions / Restrictions Precautions Precautions: Knee Precaution Booklet Issued: Yes (comment) Required Braces or Orthoses: Knee Immobilizer - Right Knee Immobilizer - Right: On when out of bed or walking Restrictions Weight Bearing Restrictions: Yes RLE Weight Bearing: Weight bearing as tolerated      Mobility  Bed Mobility Overal bed mobility: Independent             General bed mobility comments: Good speed adn effort with bed mobility tasks without the need of the bed rail  Transfers Overall transfer level: Needs assistance Equipment used: Rolling walker (2 wheeled) Transfers: Sit to/from Stand Sit to Stand: Min guard         General transfer comment: Min verbal cues for sequencing with good eccentric and concentric control  Ambulation/Gait Ambulation/Gait assistance: Min guard Gait Distance (Feet): 60 Feet Assistive device: Rolling walker (2 wheeled) Gait  Pattern/deviations: Step-to pattern;Step-through pattern;Trunk flexed Gait velocity: decreased   General Gait Details: Slow cadence but steady without LOB with RLE KI donned  Stairs Stairs: Yes Stairs assistance: Min guard Stair Management: Two rails;One rail Right Number of Stairs: 4 General stair comments: Steady ascending and descending 4 steps with B rails and then with one rail with min-mod visual and verbal cues for sequencing  Wheelchair Mobility    Modified Rankin (Stroke Patients Only)       Balance Overall balance assessment: Needs assistance   Sitting balance-Leahy Scale: Normal     Standing balance support: Bilateral upper extremity supported Standing balance-Leahy Scale: Good                               Pertinent Vitals/Pain Pain Assessment: 0-10 Pain Score: 3  Pain Location: R knee Pain Descriptors / Indicators: Aching;Sore Pain Intervention(s): Premedicated before session;Monitored during session    Home Living Family/patient expects to be discharged to:: Private residence Living Arrangements: Spouse/significant other Available Help at Discharge: Family;Available 24 hours/day Type of Home: Mobile home Home Access: Stairs to enter Entrance Stairs-Rails: Right Entrance Stairs-Number of Steps: 3 Home Layout: One level Home Equipment: Walker - standard      Prior Function Level of Independence: Independent         Comments: Ind Amb community distances without an AD, no fall history, Ind with ADLs, works FT at [employer]  Extremity/Trunk Assessment   Upper Extremity Assessment Upper Extremity Assessment: Overall WFL for tasks assessed    Lower Extremity Assessment Lower Extremity Assessment: Generalized weakness;RLE deficits/detail RLE Deficits / Details: R hip flex >/= 3/5, R ankle DF and PF AAROM WNL RLE: Unable to fully assess due to pain RLE Sensation: WNL       Communication    Communication: No difficulties  Cognition Arousal/Alertness: Awake/alert Behavior During Therapy: WFL for tasks assessed/performed Overall Cognitive Status: Within Functional Limits for tasks assessed                                        General Comments      Exercises Total Joint Exercises Ankle Circles/Pumps: PROM;Both;10 reps Quad Sets: AROM;Strengthening;Right;5 reps;10 reps Hip ABduction/ADduction: AROM;Right;10 reps Straight Leg Raises: AROM;Right;10 reps Long Arc Quad: AROM;Strengthening;Right;10 reps;15 reps Knee Flexion: AROM;Strengthening;Right;15 reps;10 reps Goniometric ROM: R knee AROM: 0-85 deg Marching in Standing: AROM;Both;5 reps Other Exercises Other Exercises: Car transfer sequencing education provided Other Exercises: Positioning education provided Other Exercises: Polar care education provided   Assessment/Plan    PT Assessment Patient needs continued PT services  PT Problem List Decreased range of motion;Decreased strength;Decreased activity tolerance;Decreased balance;Decreased knowledge of use of DME       PT Treatment Interventions DME instruction;Stair training;Gait training;Functional mobility training;Therapeutic activities;Therapeutic exercise;Balance training;Patient/family education    PT Goals (Current goals can be found in the Care Plan section)  Acute Rehab PT Goals Patient Stated Goal: To walk better PT Goal Formulation: With patient Time For Goal Achievement: 12/19/19 Potential to Achieve Goals: Good    Frequency BID   Barriers to discharge        Co-evaluation               AM-PAC PT "6 Clicks" Mobility  Outcome Measure Help needed turning from your back to your side while in a flat bed without using bedrails?: None Help needed moving from lying on your back to sitting on the side of a flat bed without using bedrails?: None Help needed moving to and from a bed to a chair (including a wheelchair)?: A  Little Help needed standing up from a chair using your arms (e.g., wheelchair or bedside chair)?: A Little Help needed to walk in hospital room?: A Little Help needed climbing 3-5 steps with a railing? : A Little 6 Click Score: 20    End of Session Equipment Utilized During Treatment: Gait belt Activity Tolerance: Patient tolerated treatment well Patient left: in chair;with nursing/sitter in room Nurse Communication: Mobility status PT Visit Diagnosis: Other abnormalities of gait and mobility (R26.89);Muscle weakness (generalized) (M62.81);Pain Pain - Right/Left: Right Pain - part of body: Knee    Time: 1135-1228 PT Time Calculation (min) (ACUTE ONLY): 53 min   Charges:   PT Evaluation $PT Eval Moderate Complexity: 1 Mod PT Treatments $Gait Training: 8-22 mins $Therapeutic Exercise: 8-22 mins        D. Scott Catera Hankins PT, DPT 12/06/19, 1:34 PM

## 2019-12-06 NOTE — Anesthesia Post-op Follow-up Note (Signed)
Anesthesia QCDR form completed.        

## 2019-12-06 NOTE — H&P (Signed)
Reviewed paper H+P, will be scanned into chart. No changes noted.  

## 2019-12-06 NOTE — Discharge Instructions (Signed)
AMBULATORY SURGERY  DISCHARGE INSTRUCTIONS   1) The drugs that you were given will stay in your system until tomorrow so for the next 24 hours you should not:  A) Drive an automobile B) Make any legal decisions C) Drink any alcoholic beverage   2) You may resume regular meals tomorrow.  Today it is better to start with liquids and gradually work up to solid foods.  You may eat anything you prefer, but it is better to start with liquids, then soup and crackers, and gradually work up to solid foods.   3) Please notify your doctor immediately if you have any unusual bleeding, trouble breathing, redness and pain at the surgery site, drainage, fever, or pain not relieved by medication.  4) Your post-operative visit with Dr.                                     is: Date:                        Time:    Please call to schedule your post-operative visit.  5) Additional Instructions: Keep all your current on all the time have someone change out the water and put a new ice as it melts.  Work on raising the foot up and trying to bend the knee.  It works best to straighten the knee while lying flat in bed and bending the knee while sitting up.  Put as much weight on the leg as you feel comfortable.  I would like you to wear the knee brace for at least a week when you are up walking and have it off when you are doing exercises to keep your knee from buckling and causing a fall.  Home health should be in contact with you today or tomorrow to set up your first visit.  I talked with someone from home health today.  You should take Lovenox shots once a day for 10 days.  Additionally pain medicine as required.  I would also recommend taking a stool softener daily and if you do not have a bowel movement in 2 days to take a laxative.  Try not to keep anything behind the back of the knee as it can cause stiff knee where it will quell the way straight.  The blue foam is to be used when you are laying flat in bed to  help force your knee straight.  Try to use it is much as possible when you are laying down.  Call the office if you are having problems.[phone]

## 2020-03-30 DIAGNOSIS — R519 Headache, unspecified: Secondary | ICD-10-CM | POA: Insufficient documentation

## 2020-06-01 ENCOUNTER — Other Ambulatory Visit: Payer: Self-pay

## 2020-06-01 ENCOUNTER — Ambulatory Visit
Admission: EM | Admit: 2020-06-01 | Discharge: 2020-06-01 | Disposition: A | Discharge status: Home / Self Care | Payer: Medicare HMO | Attending: Family Medicine | Admitting: Family Medicine

## 2020-06-01 ENCOUNTER — Encounter: Payer: Self-pay | Admitting: Emergency Medicine

## 2020-06-01 DIAGNOSIS — J069 Acute upper respiratory infection, unspecified: Secondary | ICD-10-CM

## 2020-06-01 HISTORY — DX: Migraine, unspecified, not intractable, without status migrainosus: G43.909

## 2020-06-01 MED ORDER — BENZONATATE 200 MG PO CAPS: 200.0000 mg | ORAL_CAPSULE | Freq: Three times a day (TID) | ORAL | 0 refills | Status: DC | PRN

## 2020-06-01 MED ORDER — PREDNISONE 50 MG PO TABS: ORAL_TABLET | ORAL | 0 refills | Status: DC

## 2020-06-01 NOTE — ED Triage Notes (Signed)
Patient in today c/o productive cough (yellow) and off & on laryngitis x 2 days. Patient has not taken any OTC medications. Patient has had both Moderna covid vaccines, completed 03/06/20. Patient refuses covid testing.

## 2020-06-02 NOTE — ED Provider Notes (Signed)
[facility]    CSN: [id number] Arrival date & time: 06/01/20  1950  History   Chief Complaint Chief Complaint  Patient presents with  . Cough  . Laryngitis   HPI  49 year old male presents with productive cough and voice change.  Patient reports that his symptoms started 2 days ago.  He reports productive cough.  Reports yellow sputum sputum.  Reports intermittent voice change.  No reported sick contacts.  He has had both of his vaccinations.  No medications or interventions tried.  No fever.  No other complaints.  Past Medical History:  Diagnosis Date  . Bipolar 1 disorder (HCC)   . Chronic back pain   . DDD (degenerative disc disease), cervical   . Depression   . Diabetes mellitus without complication (HCC)   . ED (erectile dysfunction)   . Elevated lipids   . GERD (gastroesophageal reflux disease)   . Headache   . Insomnia   . Migraine   . Seasonal allergies   . Wheezing     Patient Active Problem List   Diagnosis Date Noted  . 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 CHOLECYSTECTOMY    . COLONOSCOPY W/ BIOPSIES    . FRACTURE SURGERY     tail bone  . knee arthroscpy Right   . SHOULDER ARTHROSCOPY WITH DISTAL CLAVICLE RESECTION Right 10/13/2017   Procedure: SHOULDER ARTHROSCOPY WITH DISTAL CLAVICLE RESECTION;  Surgeon: Christena Flake, MD;  Location: [facility];  Service: Orthopedics;  Laterality: Right;  . SHOULDER ARTHROSCOPY WITH OPEN ROTATOR CUFF REPAIR Right 10/13/2017   Procedure: SHOULDER ARTHROSCOPY WITH OPEN ROTATOR CUFF REPAIR/;  Surgeon: Christena Flake, MD;  Location: [facility];  Service: Orthopedics;  Laterality: Right;  . TOTAL KNEE ARTHROPLASTY Right 12/06/2019   Procedure: TOTAL KNEE ARTHROPLASTY;  Surgeon: Kennedy Bucker, MD;  Location: [facility];  Service: Orthopedics;  Laterality: Right;       Home Medications    Prior to Admission medications     Medication Sig Start Date End Date Taking? Authorizing Provider  albuterol (PROVENTIL HFA;VENTOLIN HFA) 108 (90 Base) MCG/ACT inhaler Inhale 1 puff into the lungs every 4 (four) hours as needed for wheezing or shortness of breath.    Yes [provider]  albuterol (PROVENTIL) (2.5 MG/3ML) 0.083% nebulizer solution Take 2.5 mg by nebulization every 4 (four) hours as needed for wheezing.    Yes [provider]  alfuzosin (UROXATRAL) 10 MG 24 hr tablet Take 10 mg by mouth daily with breakfast.   Yes [provider]  atorvastatin (LIPITOR) 20 MG tablet Take 20 mg by mouth at bedtime.    Yes [provider]  cetirizine (ZYRTEC) 10 MG tablet Take 10 mg by mouth daily.   Yes [provider]  divalproex (DEPAKOTE ER) 250 MG 24 hr tablet Take 250 mg by mouth 3 (three) times daily.    Yes [provider]  empagliflozin (JARDIANCE) 10 MG TABS tablet Take 10 mg by mouth daily.   Yes [provider]  Erenumab-aooe 140 MG/ML SOAJ Inject 1 each into the skin every 28 (twenty-eight) days.   Yes [provider]  FLUoxetine (PROZAC) 20 MG capsule Take 20 mg by mouth daily.   Yes [provider]  gabapentin (NEURONTIN) 300 MG capsule Take 600 mg by mouth 3 (three) times daily.    Yes [provider]  Melatonin 3 MG TABS Take 3  mg by mouth at bedtime.    Yes [provider]  Multiple Vitamin (MULTIVITAMIN WITH MINERALS) TABS tablet Take 1 tablet by mouth daily.   Yes [provider]  pantoprazole (PROTONIX) 40 MG tablet Take 40 mg by mouth 2 (two) times daily.   Yes [provider]  pseudoephedrine-guaifenesin (MUCINEX D) 60-600 MG 12 hr tablet Take 1 tablet by mouth every 12 (twelve) hours.   Yes [provider]  testosterone cypionate (DEPOTESTOSTERONE CYPIONATE) 200 MG/ML injection Inject 100 mg into the muscle once a week.   Yes [provider]  traZODone (DESYREL) 50 MG tablet  Take 50 mg by mouth at bedtime.   Yes [provider]  benzonatate (TESSALON) 200 MG capsule Take 1 capsule (200 mg total) by mouth 3 (three) times daily as needed for cough. 06/01/20   Coral Spikes, DO  predniSONE (DELTASONE) 50 MG tablet 1 tablet daily x 5 days 06/01/20   Coral Spikes, DO  enoxaparin (LOVENOX) 40 MG/0.4ML injection Inject 0.4 mLs (40 mg total) into the skin daily for 10 days. 12/06/19 06/01/20  Hessie Knows, MD  famotidine (PEPCID) 20 MG tablet Take 1 tablet (20 mg total) by mouth 2 (two) times daily. 03/05/18 06/01/20  Darel Hong, MD    Family History Family History  Problem Relation Age of Onset  . Diabetes Mother   . Hypertension Mother   . Stroke Father     Social History Social History   Tobacco Use  . Smoking status: Former Smoker    Quit date: 11/28/2014    Years since quitting: 5.5  . Smokeless tobacco: Never Used  Vaping Use  . Vaping Use: Never used  Substance Use Topics  . Alcohol use: No    Alcohol/week: 0.0 standard drinks  . Drug use: No     Allergies   Other, Parabens, Tramadol, Doxycycline, and Penicillins   Review of Systems Review of Systems  Constitutional: Negative for fever.  HENT: Positive for voice change.   Respiratory: Positive for cough.    Physical Exam Triage Vital Signs ED Triage Vitals [06/01/20 1957]  Enc Vitals Group     BP (!) 147/80     Pulse Rate 83     Resp 16     Temp 98.1 F (36.7 C)     Temp Source Oral     SpO2 99 %     Weight 174 lb (78.9 kg)     Height 5\' 9"  (1.753 m)     Head Circumference      Peak Flow      Pain Score 0     Pain Loc      Pain Edu?      Excl. in [place]?    Updated Vital Signs BP (!) 147/80 (BP Location: Left Arm)   Pulse 83   Temp 98.1 F (36.7 C) (Oral)   Resp 16   Ht 5\' 9"  (1.753 m)   Wt 78.9 kg   SpO2 99%   BMI 25.70 kg/m   Visual Acuity Right Eye Distance:   Left Eye Distance:   Bilateral Distance:    Right Eye Near:   Left Eye Near:    Bilateral  Near:     Physical Exam Constitutional:      General: He is not in acute distress.    Appearance: Normal appearance. He is not ill-appearing.  HENT:     Head: Normocephalic and atraumatic.     Mouth/Throat:  Pharynx: Oropharynx is clear. No oropharyngeal exudate.  Eyes:     General:        Right eye: No discharge.        Left eye: No discharge.     Conjunctiva/sclera: Conjunctivae normal.  Cardiovascular:     Rate and Rhythm: Normal rate and regular rhythm.     Heart sounds: No murmur heard.   Pulmonary:     Effort: Pulmonary effort is normal.     Breath sounds: Normal breath sounds. No wheezing or rales.  Neurological:     Mental Status: He is alert.  Psychiatric:        Mood and Affect: Mood normal.        Behavior: Behavior normal.     UC Treatments / Results  Labs (all labs ordered are listed, but only abnormal results are displayed) Labs Reviewed - No data to display  EKG   Radiology No results found.  Procedures Procedures (including critical care time)  Medications Ordered in UC Medications - No data to display  Initial Impression / Assessment and Plan / UC Course  I have reviewed the triage vital signs and the nursing notes.  Pertinent labs & imaging results that were available during my care of the patient were reviewed by me and considered in my medical decision making (see chart for details).    49 year old male presents with viral URI with cough.  Treating with Prednisone and Tessalon Perles.  Supportive care.  Final Clinical Impressions(s) / UC Diagnoses   Final diagnoses:  Viral URI with cough   Discharge Instructions   None    ED Prescriptions    Medication Sig Dispense Auth. Provider   predniSONE (DELTASONE) 50 MG tablet 1 tablet daily x 5 days 5 tablet Cianna Kasparian G, DO   benzonatate (TESSALON) 200 MG capsule Take 1 capsule (200 mg total) by mouth 3 (three) times daily as needed for cough. 30 capsule Tommie Sams, DO     PDMP  not reviewed this encounter.   Tommie Sams, [place] 06/02/20 1253

## 2020-07-27 ENCOUNTER — Other Ambulatory Visit: Payer: Self-pay | Admitting: Orthopedic Surgery

## 2020-07-27 DIAGNOSIS — T8484XA Pain due to internal orthopedic prosthetic devices, implants and grafts, initial encounter: Secondary | ICD-10-CM

## 2020-07-27 DIAGNOSIS — Z96651 Presence of right artificial knee joint: Secondary | ICD-10-CM

## 2020-08-02 ENCOUNTER — Other Ambulatory Visit: Payer: Self-pay

## 2020-08-02 ENCOUNTER — Ambulatory Visit
Admission: RE | Admit: 2020-08-02 | Discharge: 2020-08-02 | Disposition: A | Discharge status: Home / Self Care | Payer: Medicare Other | Source: Ambulatory Visit | Attending: Orthopedic Surgery | Admitting: Orthopedic Surgery

## 2020-08-02 ENCOUNTER — Encounter
Admission: RE | Admit: 2020-08-02 | Discharge: 2020-08-02 | Disposition: A | Discharge status: Home / Self Care | Payer: Medicare Other | Source: Ambulatory Visit | Attending: Orthopedic Surgery | Admitting: Orthopedic Surgery

## 2020-08-02 DIAGNOSIS — T8484XA Pain due to internal orthopedic prosthetic devices, implants and grafts, initial encounter: Secondary | ICD-10-CM | POA: Diagnosis present

## 2020-08-02 DIAGNOSIS — Z96651 Presence of right artificial knee joint: Secondary | ICD-10-CM | POA: Insufficient documentation

## 2020-08-02 MED ORDER — TECHNETIUM TC 99M MEDRONATE IV KIT: 20.0000 | PACK | Freq: Once | INTRAVENOUS | Status: DC | PRN

## 2020-10-11 ENCOUNTER — Other Ambulatory Visit: Payer: Self-pay

## 2020-10-11 ENCOUNTER — Ambulatory Visit
Admission: RE | Admit: 2020-10-11 | Discharge: 2020-10-11 | Disposition: A | Discharge status: Home / Self Care | Payer: Medicare Other | Source: Ambulatory Visit | Attending: Physician Assistant | Admitting: Physician Assistant

## 2020-10-11 VITALS — BP 142/84 | HR 113 | Temp 98.2°F | Resp 18 | Ht 69.0 in | Wt 188.0 lb

## 2020-10-11 DIAGNOSIS — S8012XA Contusion of left lower leg, initial encounter: Secondary | ICD-10-CM

## 2020-10-11 MED ORDER — NAPROXEN 500 MG PO TABS: 500.0000 mg | ORAL_TABLET | Freq: Two times a day (BID) | ORAL | 0 refills | Status: AC

## 2020-10-11 NOTE — Discharge Instructions (Addendum)
It appears that you have a bruise.  Ice the area of swelling and pain every couple of hours for about 10 minutes each time.  Take the anti-inflammatory medication as prescribed.  You can also take Tylenol if you need it.  There does not appear to be any sign of infection or significant injury.  If your condition changes or worsens in any way please follow back up with our clinic.

## 2020-10-11 NOTE — ED Triage Notes (Signed)
Patient in today c/o a painful "knot" on his left upper leg x 2 days.

## 2020-10-11 NOTE — ED Provider Notes (Signed)
[facility]    CSN: [id number] Arrival date & time: 10/11/20  1235      History   Chief Complaint Chief Complaint  Patient presents with  . Appointment  . knot on leg    left    HPI Daniel [name] is a 49 y.o. male presenting for a swollen painful area of the left thigh that he noticed a couple of days ago.  He says that the swelling seems to have gone down but the pain continues.  He denies any known injury.  He denies any fevers, redness, warmth.  Denies any skin sores or lesions.  He has not treated this in any way.  Denies similar problem in the past.  Denies easy bruising or bleeding.  He denies pain on weightbearing.  Denies any numbness, weakness or tingling.  Patient says the area only hurts if he lays on it or touches it.  No history of blood clots.  Past medical history significant for diabetes, hyperlipidemia, and mood disorder.  Patient denies any other complaints or concerns today.  HPI  Past Medical History:  Diagnosis Date  . Bipolar 1 disorder (HCC)   . Chronic back pain   . DDD (degenerative disc disease), cervical   . Depression   . Diabetes mellitus without complication (HCC)   . ED (erectile dysfunction)   . Elevated lipids   . GERD (gastroesophageal reflux disease)   . Headache   . Insomnia   . Migraine   . Seasonal allergies   . Wheezing     Patient Active Problem List   Diagnosis Date Noted  . 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 CHOLECYSTECTOMY    . COLONOSCOPY W/ BIOPSIES    . FRACTURE SURGERY     tail bone  . knee arthroscpy Right   . SHOULDER ARTHROSCOPY WITH DISTAL CLAVICLE RESECTION Right 10/13/2017   Procedure: SHOULDER ARTHROSCOPY WITH DISTAL CLAVICLE RESECTION;  Surgeon: Christena Flake, MD;  Location: [facility];  Service: Orthopedics;  Laterality: Right;  . SHOULDER ARTHROSCOPY WITH OPEN ROTATOR CUFF REPAIR Right 10/13/2017   Procedure:  SHOULDER ARTHROSCOPY WITH OPEN ROTATOR CUFF REPAIR/;  Surgeon: Christena Flake, MD;  Location: [facility];  Service: Orthopedics;  Laterality: Right;  . TOTAL KNEE ARTHROPLASTY Right 12/06/2019   Procedure: TOTAL KNEE ARTHROPLASTY;  Surgeon: Kennedy Bucker, MD;  Location: [facility];  Service: Orthopedics;  Laterality: Right;       Home Medications    Prior to Admission medications   Medication Sig Start Date End Date Taking? Authorizing Provider  albuterol (PROVENTIL HFA;VENTOLIN HFA) 108 (90 Base) MCG/ACT inhaler Inhale 1 puff into the lungs every 4 (four) hours as needed for wheezing or shortness of breath.    Yes [provider]  albuterol (PROVENTIL) (2.5 MG/3ML) 0.083% nebulizer solution Take 2.5 mg by nebulization every 4 (four) hours as needed for wheezing.    Yes [provider]  alfuzosin (UROXATRAL) 10 MG 24 hr tablet Take 10 mg by mouth daily with breakfast.   Yes [provider]  atorvastatin (LIPITOR) 20 MG tablet Take 20 mg by mouth at bedtime.    Yes [provider]  cetirizine (ZYRTEC) 10 MG tablet Take 10 mg by mouth daily.   Yes [provider]  divalproex (DEPAKOTE ER) 250 MG 24 hr tablet Take 250 mg by mouth 3 (three) times daily.    Yes [provider]  empagliflozin (JARDIANCE) 10 MG TABS tablet Take 10 mg by mouth daily.   Yes [provider]  Erenumab-aooe 140 MG/ML SOAJ Inject 1 each into the skin every 28 (twenty-eight) days.   Yes [provider]  FLUoxetine (PROZAC) 20 MG capsule Take 20 mg by mouth daily.   Yes [provider]  gabapentin (NEURONTIN) 300 MG capsule Take 600 mg by mouth 3 (three) times daily.    Yes [provider]  Melatonin 3 MG TABS Take 3 mg by mouth at bedtime.    Yes [provider]  Multiple Vitamin (MULTIVITAMIN WITH MINERALS) TABS tablet Take 1 tablet by mouth daily.   Yes [provider]  pantoprazole (PROTONIX) 40 MG tablet Take 40 mg by  mouth 2 (two) times daily.   Yes [provider]  pseudoephedrine-guaifenesin (MUCINEX D) 60-600 MG 12 hr tablet Take 1 tablet by mouth every 12 (twelve) hours.   Yes [provider]  testosterone cypionate (DEPOTESTOSTERONE CYPIONATE) 200 MG/ML injection Inject 100 mg into the muscle once a week.   Yes [provider]  traZODone (DESYREL) 50 MG tablet Take 50 mg by mouth at bedtime.   Yes [provider]  benzonatate (TESSALON) 200 MG capsule Take 1 capsule (200 mg total) by mouth 3 (three) times daily as needed for cough. 06/01/20   Tommie Sams, DO  naproxen (NAPROSYN) 500 MG tablet Take 1 tablet (500 mg total) by mouth 2 (two) times daily for 7 days. 10/11/20 10/18/20  Shirlee Latch, PA-C  predniSONE (DELTASONE) 50 MG tablet 1 tablet daily x 5 days 06/01/20   Tommie Sams, DO  enoxaparin (LOVENOX) 40 MG/0.4ML injection Inject 0.4 mLs (40 mg total) into the skin daily for 10 days. 12/06/19 06/01/20  Kennedy Bucker, MD  famotidine (PEPCID) 20 MG tablet Take 1 tablet (20 mg total) by mouth 2 (two) times daily. 03/05/18 06/01/20  Merrily Brittle, MD    Family History Family History  Problem Relation Age of Onset  . Diabetes Mother   . Hypertension Mother   . Stroke Father     Social History Social History   Tobacco Use  . Smoking status: Former Smoker    Quit date: 11/28/2014    Years since quitting: 5.8  . Smokeless tobacco: Never Used  Vaping Use  . Vaping Use: Never used  Substance Use Topics  . Alcohol use: No    Alcohol/week: 0.0 standard drinks  . Drug use: No     Allergies   Doxycycline, Ketorolac, Other, Parabens, Tramadol, and Penicillins   Review of Systems Review of Systems  Constitutional: Negative for fatigue and fever.  Musculoskeletal: Negative for arthralgias, gait problem, joint swelling and myalgias.  Skin: Positive for color change. Negative for rash and wound.  Neurological: Negative for weakness.     Physical  Exam Triage Vital Signs ED Triage Vitals  Enc Vitals Group     BP 10/11/20 1245 (!) 142/84     Pulse Rate 10/11/20 1245 (!) 113     Resp 10/11/20 1245 18     Temp 10/11/20 1245 98.2 F (36.8 C)     Temp Source 10/11/20 1245 Oral     SpO2 10/11/20 1245 97 %     Weight 10/11/20 1246 188 lb (85.3 kg)     Height 10/11/20 1246 5\' 9"  (1.753 m)     Head Circumference --      Peak Flow --      Pain Score  10/11/20 1245 8     Pain Loc --      Pain Edu? --      Excl. in [place]? --    No data found.  Updated Vital Signs BP (!) 142/84 (BP Location: Left Arm)   Pulse (!) 113   Temp 98.2 F (36.8 C) (Oral)   Resp 18   Ht 5\' 9"  (1.753 m)   Wt 188 lb (85.3 kg)   SpO2 97%   BMI 27.76 kg/m       Physical Exam Vitals and nursing note reviewed.  Constitutional:      General: He is not in acute distress.    Appearance: Normal appearance. He is well-developed and normal weight. He is not ill-appearing or toxic-appearing.  HENT:     Head: Normocephalic and atraumatic.  Eyes:     General: No scleral icterus.    Conjunctiva/sclera: Conjunctivae normal.  Cardiovascular:     Rate and Rhythm: Regular rhythm. Tachycardia present.  Pulmonary:     Effort: Pulmonary effort is normal. No respiratory distress.  Musculoskeletal:     Cervical back: Neck supple.  Skin:    General: Skin is warm and dry.     Comments: Of the left anterolateral thigh, there is a circular area of swelling and faint yellowish ecchymosis that is approximately 1-1/2 to 2 cm in diameter.  Area is mildly tender to palpation.  There is no bony tenderness.  Neurological:     General: No focal deficit present.     Mental Status: He is alert. Mental status is at baseline.     Motor: No weakness.     Gait: Gait normal.  Psychiatric:        Mood and Affect: Mood normal.        Behavior: Behavior normal.        Thought Content: Thought content normal.      UC Treatments / Results  Labs (all labs ordered are listed, but  only abnormal results are displayed) Labs Reviewed - No data to display  EKG   Radiology No results found.  Procedures Procedures (including critical care time)  Medications Ordered in UC Medications - No data to display  Initial Impression / Assessment and Plan / UC Course  I have reviewed the triage vital signs and the nursing notes.  Pertinent labs & imaging results that were available during my care of the patient were reviewed by me and considered in my medical decision making (see chart for details).   Patient exam is consistent with contusion.  Advised him that he likely injured himself in a minor way without realizing it.  Advised cryotherapy and NSAIDs.  Advised to follow-up with our clinic as needed for any new or worsening symptoms, but explained that there is no sign of infection or any indication to get any imaging at this point.  Patient is agreeable.   Final Clinical Impressions(s) / UC Diagnoses   Final diagnoses:  Contusion of left lower extremity, initial encounter     Discharge Instructions     It appears that you have a bruise.  Ice the area of swelling and pain every couple of hours for about 10 minutes each time.  Take the anti-inflammatory medication as prescribed.  You can also take Tylenol if you need it.  There does not appear to be any sign of infection or significant injury.  If your condition changes or worsens in any way please follow back up with our clinic.  ED Prescriptions    Medication Sig Dispense Auth. Provider   naproxen (NAPROSYN) 500 MG tablet Take 1 tablet (500 mg total) by mouth 2 (two) times daily for 7 days. 14 tablet Gareth MorganEaves, Dillan Candela B, PA-C     PDMP not reviewed this encounter.   Shirlee Latchaves, Laurence Folz B, PA-C 10/11/20 1336

## 2020-10-12 ENCOUNTER — Encounter (INDEPENDENT_AMBULATORY_CARE_PROVIDER_SITE_OTHER): Payer: Self-pay

## 2020-11-27 DIAGNOSIS — R110 Nausea: Secondary | ICD-10-CM | POA: Insufficient documentation

## 2020-11-27 DIAGNOSIS — M79602 Pain in left arm: Secondary | ICD-10-CM | POA: Insufficient documentation

## 2021-01-30 DIAGNOSIS — R030 Elevated blood-pressure reading, without diagnosis of hypertension: Secondary | ICD-10-CM | POA: Insufficient documentation

## 2021-02-20 ENCOUNTER — Other Ambulatory Visit: Payer: Self-pay | Admitting: Student

## 2021-02-20 DIAGNOSIS — Z9889 Other specified postprocedural states: Secondary | ICD-10-CM

## 2021-02-20 DIAGNOSIS — M7581 Other shoulder lesions, right shoulder: Secondary | ICD-10-CM

## 2021-03-03 ENCOUNTER — Ambulatory Visit
Admission: RE | Admit: 2021-03-03 | Discharge: 2021-03-03 | Disposition: A | Discharge status: Home / Self Care | Payer: Medicare Other | Source: Ambulatory Visit | Attending: Student | Admitting: Student

## 2021-03-03 ENCOUNTER — Other Ambulatory Visit: Payer: Self-pay

## 2021-03-03 DIAGNOSIS — M7581 Other shoulder lesions, right shoulder: Secondary | ICD-10-CM | POA: Diagnosis not present

## 2021-03-03 DIAGNOSIS — Z9889 Other specified postprocedural states: Secondary | ICD-10-CM | POA: Diagnosis present

## 2021-03-12 ENCOUNTER — Ambulatory Visit
Admission: EM | Admit: 2021-03-12 | Discharge: 2021-03-12 | Disposition: A | Discharge status: Home / Self Care | Payer: Medicare Other | Attending: Sports Medicine | Admitting: Sports Medicine

## 2021-03-12 ENCOUNTER — Inpatient Hospital Stay
Admission: RE | Admit: 2021-03-12 | Discharge: 2021-03-12 | Disposition: A | Discharge status: Home / Self Care | Payer: Self-pay | Source: Ambulatory Visit

## 2021-03-12 ENCOUNTER — Other Ambulatory Visit: Payer: Self-pay

## 2021-03-12 DIAGNOSIS — J3489 Other specified disorders of nose and nasal sinuses: Secondary | ICD-10-CM | POA: Diagnosis not present

## 2021-03-12 DIAGNOSIS — J45909 Unspecified asthma, uncomplicated: Secondary | ICD-10-CM | POA: Insufficient documentation

## 2021-03-12 DIAGNOSIS — Z20822 Contact with and (suspected) exposure to covid-19: Secondary | ICD-10-CM | POA: Diagnosis not present

## 2021-03-12 DIAGNOSIS — Z79899 Other long term (current) drug therapy: Secondary | ICD-10-CM | POA: Insufficient documentation

## 2021-03-12 DIAGNOSIS — R0981 Nasal congestion: Secondary | ICD-10-CM | POA: Diagnosis not present

## 2021-03-12 DIAGNOSIS — Z885 Allergy status to narcotic agent status: Secondary | ICD-10-CM | POA: Insufficient documentation

## 2021-03-12 DIAGNOSIS — Z880 Allergy status to penicillin: Secondary | ICD-10-CM | POA: Insufficient documentation

## 2021-03-12 DIAGNOSIS — B9789 Other viral agents as the cause of diseases classified elsewhere: Secondary | ICD-10-CM

## 2021-03-12 DIAGNOSIS — B349 Viral infection, unspecified: Secondary | ICD-10-CM | POA: Diagnosis present

## 2021-03-12 DIAGNOSIS — Z881 Allergy status to other antibiotic agents status: Secondary | ICD-10-CM | POA: Insufficient documentation

## 2021-03-12 DIAGNOSIS — R0982 Postnasal drip: Secondary | ICD-10-CM | POA: Diagnosis not present

## 2021-03-12 DIAGNOSIS — R509 Fever, unspecified: Secondary | ICD-10-CM

## 2021-03-12 DIAGNOSIS — Z87891 Personal history of nicotine dependence: Secondary | ICD-10-CM | POA: Diagnosis not present

## 2021-03-12 DIAGNOSIS — R0989 Other specified symptoms and signs involving the circulatory and respiratory systems: Secondary | ICD-10-CM

## 2021-03-12 DIAGNOSIS — Z888 Allergy status to other drugs, medicaments and biological substances status: Secondary | ICD-10-CM | POA: Insufficient documentation

## 2021-03-12 DIAGNOSIS — J988 Other specified respiratory disorders: Secondary | ICD-10-CM | POA: Diagnosis not present

## 2021-03-12 DIAGNOSIS — R059 Cough, unspecified: Secondary | ICD-10-CM | POA: Diagnosis not present

## 2021-03-12 LAB — RESP PANEL BY RT-PCR (FLU A&B, COVID) ARPGX2
Influenza A by PCR: NEGATIVE
Influenza B by PCR: NEGATIVE
SARS Coronavirus 2 by RT PCR: NEGATIVE

## 2021-03-12 MED ORDER — PSEUDOEPHEDRINE-GUAIFENESIN ER 60-600 MG PO TB12: 1.0000 | ORAL_TABLET | Freq: Two times a day (BID) | ORAL | 0 refills | Status: DC

## 2021-03-12 MED ORDER — BENZONATATE 200 MG PO CAPS: 200.0000 mg | ORAL_CAPSULE | Freq: Three times a day (TID) | ORAL | 0 refills | Status: DC | PRN

## 2021-03-12 NOTE — ED Provider Notes (Signed)
[facility]    CSN: [id number] Arrival date & time: 03/12/21  1105      History   Chief Complaint Chief Complaint  Patient presents with  . Fever    HPI Daniel [name] is a 50 y.o. male.   Patient is a pleasant 50 year old male who presents for evaluation of the above issue.  Normally sees [facility] but was unable to get an appointment.  He works at [employer]  He reports having fever, rhinorrhea, nasal congestion and chest congestion since yesterday.  His fever has been as high as 100.4.  No sinus pressure, ear pain, sore throat.  He does report a mild dry cough.  No wheezing.  He does have a history of asthma takes albuterol.  He also has type 2 diabetes, hypercholesterolemia, and bipolar disorder.  He has been vaccinated against COVID and has received his booster.  He also has received his flu shot.  No COVID exposure or COVID history.  No significant chest pain or shortness of breath.  No red flag signs or symptoms elicited on history.     Past Medical History:  Diagnosis Date  . Bipolar 1 disorder (HCC)   . Chronic back pain   . DDD (degenerative disc disease), cervical   . Depression   . Diabetes mellitus without complication (HCC)   . ED (erectile dysfunction)   . Elevated lipids   . GERD (gastroesophageal reflux disease)   . Headache   . Insomnia   . Migraine   . Seasonal allergies   . Wheezing     Patient Active Problem List   Diagnosis Date Noted  . 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 CHOLECYSTECTOMY    . COLONOSCOPY W/ BIOPSIES    . FRACTURE SURGERY     tail bone  . knee arthroscpy Right   . SHOULDER ARTHROSCOPY WITH DISTAL CLAVICLE RESECTION Right 10/13/2017   Procedure: SHOULDER ARTHROSCOPY WITH DISTAL CLAVICLE RESECTION;  Surgeon: Christena Flake, MD;  Location: [facility];  Service: Orthopedics;  Laterality: Right;  . SHOULDER ARTHROSCOPY WITH OPEN  ROTATOR CUFF REPAIR Right 10/13/2017   Procedure: SHOULDER ARTHROSCOPY WITH OPEN ROTATOR CUFF REPAIR/;  Surgeon: Christena Flake, MD;  Location: [facility];  Service: Orthopedics;  Laterality: Right;  . TOTAL KNEE ARTHROPLASTY Right 12/06/2019   Procedure: TOTAL KNEE ARTHROPLASTY;  Surgeon: Kennedy Bucker, MD;  Location: [facility];  Service: Orthopedics;  Laterality: Right;       Home Medications    Prior to Admission medications   Medication Sig Start Date End Date Taking? Authorizing Provider  albuterol (PROVENTIL HFA;VENTOLIN HFA) 108 (90 Base) MCG/ACT inhaler Inhale 1 puff into the lungs every 4 (four) hours as needed for wheezing or shortness of breath.    Yes [provider]  albuterol (PROVENTIL) (2.5 MG/3ML) 0.083% nebulizer solution Take 2.5 mg by nebulization every 4 (four) hours as needed for wheezing.    Yes [provider]  alfuzosin (UROXATRAL) 10 MG 24 hr tablet Take 10 mg by mouth daily with breakfast.   Yes [provider]  atorvastatin (LIPITOR) 20 MG tablet Take 20 mg by mouth at bedtime.    Yes [provider]  cetirizine (ZYRTEC) 10 MG tablet Take 10 mg by mouth daily.   Yes [provider]  divalproex (DEPAKOTE ER) 250 MG 24 hr tablet Take 250 mg by mouth 3 (three) times daily.  Yes [provider]  empagliflozin (JARDIANCE) 10 MG TABS tablet Take 10 mg by mouth daily.   Yes [provider]  Erenumab-aooe 140 MG/ML SOAJ Inject 1 each into the skin every 28 (twenty-eight) days.   Yes [provider]  gabapentin (NEURONTIN) 300 MG capsule Take 600 mg by mouth 3 (three) times daily.    Yes [provider]  Melatonin 3 MG TABS Take 3 mg by mouth at bedtime.    Yes [provider]  Multiple Vitamin (MULTIVITAMIN WITH MINERALS) TABS tablet Take 1 tablet by mouth daily.   Yes [provider]  pantoprazole (PROTONIX) 40 MG tablet Take 40 mg by mouth 2 (two) times daily.   Yes [provider]  traZODone (DESYREL) 50 MG tablet Take 50 mg by mouth at bedtime.   Yes [provider]  benzonatate (TESSALON) 200 MG capsule Take 1 capsule (200 mg total) by mouth 3 (three) times daily as needed for cough. 03/12/21   Delton See, MD  FLUoxetine (PROZAC) 20 MG capsule Take 20 mg by mouth daily.    [provider]  predniSONE (DELTASONE) 50 MG tablet 1 tablet daily x 5 days 06/01/20   Tommie Sams, DO  pseudoephedrine-guaifenesin (MUCINEX D) 60-600 MG 12 hr tablet Take 1 tablet by mouth every 12 (twelve) hours. 03/12/21   Delton See, MD  testosterone cypionate (DEPOTESTOSTERONE CYPIONATE) 200 MG/ML injection Inject 100 mg into the muscle once a week.    [provider]  enoxaparin (LOVENOX) 40 MG/0.4ML injection Inject 0.4 mLs (40 mg total) into the skin daily for 10 days. 12/06/19 06/01/20  Kennedy Bucker, MD  famotidine (PEPCID) 20 MG tablet Take 1 tablet (20 mg total) by mouth 2 (two) times daily. 03/05/18 06/01/20  Merrily Brittle, MD    Family History Family History  Problem Relation Age of Onset  . Diabetes Mother   . Hypertension Mother   . Stroke Father     Social History Social History   Tobacco Use  . Smoking status: Former Smoker    Quit date: 11/28/2014    Years since quitting: 6.3  . Smokeless tobacco: Never Used  Vaping Use  . Vaping Use: Never used  Substance Use Topics  . Alcohol use: No    Alcohol/week: 0.0 standard drinks  . Drug use: No     Allergies   Doxycycline, Ketorolac, Other, Parabens, Tramadol, and Penicillins   Review of Systems Review of Systems  Constitutional: Positive for fever. Negative for activity change, appetite change, chills, diaphoresis and fatigue.  HENT: Positive for congestion, postnasal drip and rhinorrhea. Negative for ear discharge, ear pain, sinus pressure, sinus pain, sneezing and sore throat.   Eyes: Negative.  Negative for pain.  Respiratory: Positive for cough. Negative for  chest tightness, shortness of breath and wheezing.   Cardiovascular: Negative.  Negative for chest pain and palpitations.  Gastrointestinal: Negative.  Negative for abdominal pain, constipation, diarrhea, nausea and vomiting.  Genitourinary: Negative for dysuria, flank pain, frequency, hematuria and urgency.  Musculoskeletal: Negative.  Negative for arthralgias, back pain, myalgias and neck pain.  Skin: Negative.  Negative for color change, pallor, rash and wound.  Neurological: Negative.  Negative for dizziness, syncope, light-headedness, numbness and headaches.  All other systems reviewed and are negative.    Physical Exam Triage Vital Signs ED Triage Vitals  Enc Vitals Group     BP 03/12/21 1126 118/79     Pulse Rate 03/12/21 1126 85  Resp 03/12/21 1126 18     Temp 03/12/21 1126 98.4 F (36.9 C)     Temp Source 03/12/21 1126 Oral     SpO2 03/12/21 1126 97 %     Weight 03/12/21 1124 178 lb (80.7 kg)     Height 03/12/21 1124 5\' 9"  (1.753 m)     Head Circumference --      Peak Flow --      Pain Score 03/12/21 1123 8     Pain Loc --      Pain Edu? --      Excl. in GC? --    No data found.  Updated Vital Signs BP 118/79 (BP Location: Left Arm)   Pulse 85   Temp 98.4 F (36.9 C) (Oral)   Resp 18   Ht 5\' 9"  (1.753 m)   Wt 80.7 kg   SpO2 97%   BMI 26.29 kg/m   Visual Acuity Right Eye Distance:   Left Eye Distance:   Bilateral Distance:    Right Eye Near:   Left Eye Near:    Bilateral Near:     Physical Exam Vitals and nursing note reviewed.  Constitutional:      General: He is not in acute distress.    Appearance: Normal appearance. He is not ill-appearing, toxic-appearing or diaphoretic.  HENT:     Head: Normocephalic and atraumatic.     Right Ear: Tympanic membrane normal.     Left Ear: Tympanic membrane normal.     Nose: Congestion and rhinorrhea present.     Mouth/Throat:     Mouth: Mucous membranes are moist.     Pharynx: No oropharyngeal exudate  or posterior oropharyngeal erythema.  Eyes:     General: No scleral icterus.       Right eye: No discharge.        Left eye: No discharge.     Extraocular Movements: Extraocular movements intact.     Conjunctiva/sclera: Conjunctivae normal.     Pupils: Pupils are equal, round, and reactive to light.  Cardiovascular:     Rate and Rhythm: Normal rate and regular rhythm.     Pulses: Normal pulses.     Heart sounds: Normal heart sounds. No murmur heard. No friction rub. No gallop.   Pulmonary:     Effort: Pulmonary effort is normal. No respiratory distress.     Breath sounds: Normal breath sounds. No stridor. No wheezing, rhonchi or rales.  Musculoskeletal:     Cervical back: Normal range of motion and neck supple. No rigidity or tenderness.  Lymphadenopathy:     Cervical: Cervical adenopathy present.  Skin:    General: Skin is warm and dry.     Capillary Refill: Capillary refill takes less than 2 seconds.     Findings: No erythema, lesion or rash.  Neurological:     General: No focal deficit present.     Mental Status: He is alert and oriented to person, place, and time.      UC Treatments / Results  Labs (all labs ordered are listed, but only abnormal results are displayed) Labs Reviewed  RESP PANEL BY RT-PCR (FLU A&B, COVID) [id number]    EKG   Radiology No results found.  Procedures Procedures (including critical care time)  Medications Ordered in UC Medications - No data to display  Initial Impression / Assessment and Plan / UC Course  I have reviewed the triage vital signs and the nursing notes.  Pertinent labs & imaging results that  were available during my care of the patient were reviewed by me and considered in my medical decision making (see chart for details).  Clinical impression: Viral illness with cough low-grade fever postnasal drip.  Treatment plan: 1.  The findings and treatment plan were discussed in detail with the patient.  Patient was in  agreement. 2.  I did have a long discussion with him regarding the fact that this was a viral process and that no antibiotics we are indicated at this time.  This may change. 3.  We will check a COVID test it was negative.  Checked an influenza a and B test today were both negative as well. 4.  I did prescribe Tessalon Perles and Mucinex D for the patient.  Sent it to his pharmacy. 5.  Educational handouts provided. 6.  Plenty of rest, plenty of fluids, Tylenol or Motrin for any fever or discomfort. 7.  Continue with his regular medications. 8.  Gave him a work note. 9.  If symptoms persist he should see his primary care provider.  If they worsen he should go to the ER. 10.  Follow-up here as needed.    Final Clinical Impressions(s) / UC Diagnoses   Final diagnoses:  Cough  Fever, unspecified  Chest congestion  Viral respiratory illness  Rhinorrhea   Discharge Instructions   None    ED Prescriptions    Medication Sig Dispense Auth. Provider   benzonatate (TESSALON) 200 MG capsule Take 1 capsule (200 mg total) by mouth 3 (three) times daily as needed for cough. 30 capsule Delton SeeBarnes, Livy Ross, MD   pseudoephedrine-guaifenesin [facility](MUCINEX D) 60-600 MG 12 hr tablet Take 1 tablet by mouth every 12 (twelve) hours. 10 tablet Delton SeeBarnes, Ima Hafner, MD     PDMP not reviewed this encounter.   Delton SeeBarnes, Gearl Baratta, MD 03/17/21 [phone]

## 2021-03-12 NOTE — ED Triage Notes (Signed)
Patient states that he has been having a fever, cough and nasal congestion x yesterday morning.

## 2021-04-11 ENCOUNTER — Other Ambulatory Visit: Payer: Self-pay

## 2021-04-11 ENCOUNTER — Ambulatory Visit
Admission: RE | Admit: 2021-04-11 | Discharge: 2021-04-11 | Disposition: A | Discharge status: Home / Self Care | Payer: Medicare Other | Source: Ambulatory Visit | Attending: Sports Medicine | Admitting: Sports Medicine

## 2021-04-11 VITALS — BP 110/77 | HR 90 | Temp 98.5°F | Resp 16 | Ht 69.0 in | Wt 188.0 lb

## 2021-04-11 DIAGNOSIS — S61012A Laceration without foreign body of left thumb without damage to nail, initial encounter: Secondary | ICD-10-CM

## 2021-04-11 DIAGNOSIS — S6992XA Unspecified injury of left wrist, hand and finger(s), initial encounter: Secondary | ICD-10-CM | POA: Diagnosis not present

## 2021-04-11 NOTE — Discharge Instructions (Signed)
As we discussed, since you reported this to your workplace you need to get that paperwork filled out and be seen under Worker's Comp./occupational health facility and get the appropriate care. We gave you information on this. Given that has been 20 hours since your injury, doing a complete suture repair is not indicated at this time.  That said you do need to receive the necessary care and be followed along appropriately by a healthcare provider.

## 2021-04-11 NOTE — ED Triage Notes (Signed)
Pt sts he was cutting meat last night and cut his L thumb. appr 2cm laceration to thumb. Bleeding controlled in triage. Last tetanus within 5 yrs

## 2021-04-11 NOTE — ED Provider Notes (Signed)
[facility]    CSN: [id number] Arrival date & time: 04/11/21  1250      History   Chief Complaint Chief Complaint  Patient presents with  . Laceration    HPI Daniel [name] is a 50 y.o. male.   50 year old right-hand-dominant male who presents for evaluation of a laceration to his left thumb.  He works at [employer] here in [place].  He reports he cut his left thumb while working last evening around 7:30 p.m.  Date of injury Apr 10, 2021.  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  Patient reports he finished his shift with Band-Aids.  I asked him if he considered going to the emergency room and he reports "I do not drive so I had no way to get there".  He says that he lives close by and he was able to walk here.  His workplace is within walking distance of the clinic and his home.  He denies any fever shakes chills.  He is still having some bleeding at the site.  No red flag signs or symptoms are elicited on history.     Past Medical History:  Diagnosis Date  . Bipolar 1 disorder (HCC)   . Chronic back pain   . DDD (degenerative disc disease), cervical   . Depression   . Diabetes mellitus without complication (HCC)   . ED (erectile dysfunction)   . Elevated lipids   . GERD (gastroesophageal reflux disease)   . Headache   . Insomnia   . Migraine   . Seasonal allergies   . Wheezing     Patient Active Problem List   Diagnosis Date Noted  . 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 CHOLECYSTECTOMY    . COLONOSCOPY W/ BIOPSIES    . FRACTURE SURGERY     tail bone  . knee arthroscpy Right   . SHOULDER ARTHROSCOPY WITH DISTAL CLAVICLE RESECTION Right 10/13/2017   Procedure: SHOULDER ARTHROSCOPY WITH DISTAL CLAVICLE  RESECTION;  Surgeon: Christena FlakePoggi, John J, MD;  Location: [facility];  Service: Orthopedics;  Laterality: Right;  . SHOULDER ARTHROSCOPY WITH OPEN ROTATOR CUFF REPAIR Right 10/13/2017   Procedure: SHOULDER ARTHROSCOPY WITH OPEN ROTATOR CUFF REPAIR/;  Surgeon: Christena FlakePoggi, John J, MD;  Location: [facility];  Service: Orthopedics;  Laterality: Right;  . TOTAL KNEE ARTHROPLASTY Right 12/06/2019   Procedure: TOTAL KNEE ARTHROPLASTY;  Surgeon: Kennedy BuckerMenz, Michael, MD;  Location: [facility];  Service: Orthopedics;  Laterality: Right;       Home Medications    Prior to Admission medications   Medication Sig Start Date End Date Taking? Authorizing Provider  albuterol (PROVENTIL HFA;VENTOLIN HFA) 108 (90 Base) MCG/ACT inhaler Inhale 1 puff into the lungs every 4 (four) hours as needed for wheezing or shortness of breath.     [provider]  albuterol (PROVENTIL) (2.5 MG/3ML) 0.083% nebulizer solution Take 2.5 mg by nebulization every 4 (four) hours as needed for wheezing.     [provider]  alfuzosin (UROXATRAL) 10 MG 24 hr tablet Take 10 mg by mouth daily with breakfast.    [provider]  atorvastatin (LIPITOR) 20 MG tablet Take 20 mg by mouth at bedtime.     [provider]  benzonatate (TESSALON) 200 MG capsule Take 1 capsule (200 mg total) by mouth 3 (three) times daily as needed for cough. 03/12/21   Delton See, MD  cetirizine (ZYRTEC) 10 MG tablet Take 10 mg by mouth daily.    [provider]  divalproex (DEPAKOTE ER) 250 MG 24 hr tablet Take 250 mg by mouth 3 (three) times daily.     [provider]  empagliflozin (JARDIANCE) 10 MG TABS tablet Take 10 mg by mouth daily.    [provider]  Erenumab-aooe 140 MG/ML SOAJ Inject 1 each into the skin every 28 (twenty-eight) days.    [provider]  FLUoxetine (PROZAC) 20 MG capsule Take 20 mg by mouth daily.    [provider]  gabapentin (NEURONTIN) 300 MG capsule Take 600 mg by mouth  3 (three) times daily.     [provider]  Melatonin 3 MG TABS Take 3 mg by mouth at bedtime.     [provider]  Multiple Vitamin (MULTIVITAMIN WITH MINERALS) TABS tablet Take 1 tablet by mouth daily.    [provider]  pantoprazole (PROTONIX) 40 MG tablet Take 40 mg by mouth 2 (two) times daily.    [provider]  predniSONE (DELTASONE) 50 MG tablet 1 tablet daily x 5 days 06/01/20   Tommie Sams, DO  pseudoephedrine-guaifenesin (MUCINEX D) 60-600 MG 12 hr tablet Take 1 tablet by mouth every 12 (twelve) hours. 03/12/21   Delton See, MD  testosterone cypionate (DEPOTESTOSTERONE CYPIONATE) 200 MG/ML injection Inject 100 mg into the muscle once a week.    [provider]  traZODone (DESYREL) 50 MG tablet Take 50 mg by mouth at bedtime.    [provider]  enoxaparin (LOVENOX) 40 MG/0.4ML injection Inject 0.4 mLs (40 mg total) into the skin daily for 10 days. 12/06/19 06/01/20  Kennedy Bucker, MD  famotidine (PEPCID) 20 MG tablet Take 1 tablet (20 mg total) by mouth 2 (two) times daily. 03/05/18 06/01/20  Merrily Brittle, MD    Family History Family History  Problem Relation Age of Onset  . Diabetes Mother   . Hypertension Mother   . Stroke Father     Social History Social History   Tobacco Use  . Smoking status: Former Smoker    Quit date: 11/28/2014    Years since quitting: 6.3  . Smokeless tobacco: Never Used  Vaping Use  . Vaping Use: Never used  Substance Use Topics  . Alcohol use: No    Alcohol/week: 0.0 standard drinks  . Drug use: No     Allergies   Doxycycline, Ketorolac, Other, Parabens, Tramadol, and Penicillins   Review of Systems Review of Systems  Constitutional: Negative.  Negative for appetite change, chills, diaphoresis and fever.  HENT: Negative.  Negative for congestion.   Eyes: Negative.  Negative for pain.  Respiratory: Negative.  Negative for cough.   Cardiovascular: Negative.  Negative for  chest pain.  Gastrointestinal: Negative.  Negative for abdominal pain.  Genitourinary: Negative.  Negative for dysuria.  Musculoskeletal: Positive for arthralgias. Negative for back pain, neck pain and neck stiffness.  Skin: Positive for wound. Negative for color change, pallor and rash.  Neurological: Negative.  Negative for dizziness.  Hematological: Negative.  Negative for adenopathy. Does not bruise/bleed easily.  All other systems reviewed and are negative.    Physical Exam Triage Vital Signs ED Triage Vitals  Enc Vitals Group     BP 04/11/21 1328 110/77     Pulse  Rate 04/11/21 1328 90     Resp 04/11/21 1328 16     Temp 04/11/21 1328 98.5 F (36.9 C)     Temp Source 04/11/21 1328 Oral     SpO2 04/11/21 1328 98 %     Weight 04/11/21 1329 188 lb (85.3 kg)     Height 04/11/21 1329 5\' 9"  (1.753 m)     Head Circumference --      Peak Flow --      Pain Score 04/11/21 1329 8     Pain Loc --      Pain Edu? --      Excl. in GC? --    No data found.  Updated Vital Signs BP 110/77   Pulse 90   Temp 98.5 F (36.9 C) (Oral)   Resp 16   Ht 5\' 9"  (1.753 m)   Wt 85.3 kg   SpO2 98%   BMI 27.76 kg/m   Visual Acuity Right Eye Distance:   Left Eye Distance:   Bilateral Distance:    Right Eye Near:   Left Eye Near:    Bilateral Near:     Physical Exam Vitals and nursing note reviewed.  Constitutional:      General: He is not in acute distress.    Appearance: Normal appearance. He is not ill-appearing, toxic-appearing or diaphoretic.  Eyes:     Extraocular Movements: Extraocular movements intact.     Conjunctiva/sclera: Conjunctivae normal.     Pupils: Pupils are equal, round, and reactive to light.  Cardiovascular:     Rate and Rhythm: Normal rate and regular rhythm.     Pulses: Normal pulses.     Heart sounds: Normal heart sounds.  Pulmonary:     Effort: Pulmonary effort is normal.     Breath sounds: Normal breath sounds.  Musculoskeletal:        General:  Tenderness and signs of injury present.     Comments: See skin exam  Skin:    General: Skin is warm.     Capillary Refill: Capillary refill takes less than 2 seconds.     Findings: Lesion present. No rash.     Comments: Left thumb: Patient does have a 3 cm laceration over the proximal phalanx of the thumb.  It is actively bleeding.  Flexor and extensor tendons are intact.  There is no ligamentous laxity.  It does not involve the first MCP or PIP joints.  Neurological:     General: No focal deficit present.     Mental Status: He is alert and oriented to person, place, and time.      UC Treatments / Results  Labs (all labs ordered are listed, but only abnormal results are displayed) Labs Reviewed - No data to display  EKG   Radiology No results found.  Procedures Procedures (including critical care time)  Medications Ordered in UC Medications - No data to display  Initial Impression / Assessment and Plan / UC Course  I have reviewed the triage vital signs and the nursing notes.  Pertinent labs & imaging results that were available during my care of the patient were reviewed by me and considered in my medical decision making (see chart for details).  Clinical impression: Left thumb laceration.  It is a work-related injury.  Date of injury was Apr 10, 2021.  Patient is currently 20 hours post injury.  Treatment plan: 1.  The findings and treatment plan were discussed in detail with the patient.  Patient was  in agreement. 2.  We did have a long discussion regarding management.  My concern is that he has 20 hours post injury and suturing this is not indicated.  He would have benefited from being seen last evening at the time he was injured. 3.  Given that he did reported to his supervisor, we have recommended that he go back to his workplace and get the paperwork filed that he did not file last night.  He should then be seen at the [facility]. facility per the choice of the  employer.  If that is [place] that he needs to go to occupational health at [facility].  Provided you that information.  Again, your workplace will decide where to send you. 4.  I Stressed how important it is for the patient to take care of this and be evaluated in the appropriate facility and get the care that you need.  This will start with going back to her workplace and get the paperwork completed. 5.  Laceration was dressed and patient was discharged in stable condition.    Final Clinical Impressions(s) / UC Diagnoses   Final diagnoses:  Laceration of left thumb without foreign body without damage to nail, initial encounter  Injury of left thumb, initial encounter     Discharge Instructions     As we discussed, since you reported this to your workplace you need to get that paperwork filled out and be seen under Worker's Comp./occupational health facility and get the appropriate care. We gave you information on this. Given that has been 20 hours since your injury, doing a complete suture repair is not indicated at this time.  That said you do need to receive the necessary care and be followed along appropriately by a healthcare provider.    ED Prescriptions    None     PDMP not reviewed this encounter.   Delton See, MD 04/11/21 1425

## 2021-04-16 ENCOUNTER — Other Ambulatory Visit: Payer: Self-pay | Admitting: Orthopedic Surgery

## 2021-05-07 ENCOUNTER — Other Ambulatory Visit: Payer: Self-pay

## 2021-05-07 ENCOUNTER — Other Ambulatory Visit
Admission: RE | Admit: 2021-05-07 | Discharge: 2021-05-07 | Disposition: A | Discharge status: Home / Self Care | Payer: Medicare Other | Source: Ambulatory Visit | Attending: Orthopedic Surgery | Admitting: Orthopedic Surgery

## 2021-05-07 DIAGNOSIS — Z01818 Encounter for other preprocedural examination: Secondary | ICD-10-CM | POA: Insufficient documentation

## 2021-05-07 HISTORY — DX: Unspecified asthma, uncomplicated: J45.909

## 2021-05-07 LAB — TYPE AND SCREEN
ABO/RH(D): A POS
Antibody Screen: NEGATIVE

## 2021-05-07 LAB — CBC WITH DIFFERENTIAL/PLATELET
Abs Immature Granulocytes: 0.03 10*3/uL (ref 0.00–0.07)
Basophils Absolute: 0 10*3/uL (ref 0.0–0.1)
Basophils Relative: 1 %
Eosinophils Absolute: 0.2 10*3/uL (ref 0.0–0.5)
Eosinophils Relative: 5 %
HCT: 39.9 % (ref 39.0–52.0)
Hemoglobin: 13.8 g/dL (ref 13.0–17.0)
Immature Granulocytes: 1 %
Lymphocytes Relative: 34 %
Lymphs Abs: 1.3 10*3/uL (ref 0.7–4.0)
MCH: 30.4 pg (ref 26.0–34.0)
MCHC: 34.6 g/dL (ref 30.0–36.0)
MCV: 87.9 fL (ref 80.0–100.0)
Monocytes Absolute: 0.5 10*3/uL (ref 0.1–1.0)
Monocytes Relative: 13 %
Neutro Abs: 1.8 10*3/uL (ref 1.7–7.7)
Neutrophils Relative %: 46 %
Platelets: 201 10*3/uL (ref 150–400)
RBC: 4.54 MIL/uL (ref 4.22–5.81)
RDW: 12.2 % (ref 11.5–15.5)
WBC: 3.8 10*3/uL — ABNORMAL LOW (ref 4.0–10.5)
nRBC: 0 % (ref 0.0–0.2)

## 2021-05-07 LAB — SURGICAL PCR SCREEN
MRSA, PCR: NEGATIVE
Staphylococcus aureus: NEGATIVE

## 2021-05-07 LAB — COMPREHENSIVE METABOLIC PANEL
ALT: 26 U/L (ref 0–44)
AST: 23 U/L (ref 15–41)
Albumin: 4.4 g/dL (ref 3.5–5.0)
Alkaline Phosphatase: 57 U/L (ref 38–126)
Anion gap: 7 (ref 5–15)
BUN: 19 mg/dL (ref 6–20)
CO2: 30 mmol/L (ref 22–32)
Calcium: 9.1 mg/dL (ref 8.9–10.3)
Chloride: 98 mmol/L (ref 98–111)
Creatinine, Ser: 1.04 mg/dL (ref 0.61–1.24)
GFR, Estimated: >60 mL/min (ref >60)
Glucose, Bld: 156 mg/dL — ABNORMAL HIGH (ref 70–99)
Potassium: 3.8 mmol/L (ref 3.5–5.1)
Sodium: 135 mmol/L (ref 135–145)
Total Bilirubin: 0.8 mg/dL (ref 0.3–1.2)
Total Protein: 7 g/dL (ref 6.5–8.1)

## 2021-05-07 NOTE — Patient Instructions (Signed)
Your procedure is scheduled on: 05/14/21 Report to DAY SURGERY DEPARTMENT LOCATED ON 2ND FLOOR MEDICAL MALL ENTRANCE. To find out your arrival time please call [phone] between 1PM - 3PM on 05/13/21.  Remember: Instructions that are not followed completely may result in serious medical risk, up to and including death, or upon the discretion of your surgeon and anesthesiologist your surgery may need to be rescheduled.     _X__ 1. Do not eat food after midnight the night before your procedure.                 No gum chewing or hard candies. You may drink clear liquids up to 2 hours                 before you are scheduled to arrive for your surgery- DO not drink clear                 liquids within 2 hours of the start of your surgery.                                   Diabetics water only  __X__2.  On the morning of surgery brush your teeth with toothpaste and water, you                 may rinse your mouth with mouthwash if you wish.  Do not swallow any              toothpaste of mouthwash.     _X__ 3.  No Alcohol for 24 hours before or after surgery.   _X__ 4.  Do Not Smoke or use e-cigarettes For 24 Hours Prior to Your Surgery.                 Do not use any chewable tobacco products for at least 6 hours prior to                 surgery.  ____  5.  Bring all medications with you on the day of surgery if instructed.   __X__  6.  Notify your doctor if there is any change in your medical condition      (cold, fever, infections).     Do not wear jewelry, make-up, hairpins, clips or nail polish. Do not wear lotions, powders, or perfumes.  Do not shave 48 hours prior to surgery. Men may shave face and neck. Do not bring valuables to the hospital.    [facility] is not responsible for any belongings or valuables.  Contacts, dentures/partials or body piercings may not be worn into surgery. Bring a case for your contacts, glasses or hearing aids, a denture cup will be supplied. Leave  your suitcase in the car. After surgery it may be brought to your room. For patients admitted to the hospital, discharge time is determined by your treatment team.   Patients discharged the day of surgery will not be allowed to drive home.   Please read over the following fact sheets that you were given:   MRSA Information, chg soap, incentive spirometer, emmi video  __X__ Take these medicines the morning of surgery with A SIP OF WATER:    1. divalproex (DEPAKOTE ER) 250 MG 24 hr tablet  2. DULoxetine (CYMBALTA) 20 MG capsule  3. FLUoxetine (PROZAC) 20 MG capsule  4. gabapentin (NEURONTIN) 600 MG capsule  5. pantoprazole (PROTONIX) 40 MG  tablet  6. propranolol (INDERAL) 10 MG tablet  ____ Fleet Enema (as directed)   __X__ Use CHG Soap/SAGE wipes as directed    __X__ Use inhalers on the day of surgery     NEBULIZER TREATMENT BEFORE ARRIVING  ____ Stop metformin/Janumet/Farxiga 2 days prior to surgery    ____ Take 1/2 of usual insulin dose the night before surgery. No insulin the morning          of surgery.   ____ Stop Blood Thinners Coumadin/Plavix/Xarelto/Pleta/Pradaxa/Eliquis/Effient/Aspirin  on   Or contact your Surgeon, Cardiologist or Medical Doctor regarding  ability to stop your blood thinners  __X__ Stop Anti-inflammatories 7 days before surgery such as Advil, Ibuprofen, Motrin,  BC or Goodies Powder, Naprosyn, Naproxen, Aleve, Aspirin   STOP MELOXICAM TODAY  __X__ Stop all herbal supplements, fish oil or vitamin E until after surgery.  HOLD MELATONIN FOR 7 DAYS  ____ Bring C-Pap to the hospital.

## 2021-05-08 ENCOUNTER — Other Ambulatory Visit: Payer: Self-pay | Admitting: Orthopedic Surgery

## 2021-05-10 ENCOUNTER — Other Ambulatory Visit
Admission: RE | Admit: 2021-05-10 | Discharge: 2021-05-10 | Disposition: A | Discharge status: Home / Self Care | Payer: Medicare Other | Source: Ambulatory Visit | Attending: Orthopedic Surgery | Admitting: Orthopedic Surgery

## 2021-05-10 ENCOUNTER — Other Ambulatory Visit: Payer: Self-pay

## 2021-05-10 DIAGNOSIS — Z20822 Contact with and (suspected) exposure to covid-19: Secondary | ICD-10-CM | POA: Diagnosis not present

## 2021-05-10 DIAGNOSIS — Z01812 Encounter for preprocedural laboratory examination: Secondary | ICD-10-CM | POA: Insufficient documentation

## 2021-05-10 LAB — URINALYSIS, ROUTINE W REFLEX MICROSCOPIC
Bacteria, UA: NONE SEEN
Bilirubin Urine: NEGATIVE
Glucose, UA: >=500 mg/dL — AB
Hgb urine dipstick: NEGATIVE
Ketones, ur: NEGATIVE mg/dL
Leukocytes,Ua: NEGATIVE
Nitrite: NEGATIVE
Protein, ur: NEGATIVE mg/dL
Specific Gravity, Urine: 1.017 (ref 1.005–1.030)
pH: 6 (ref 5.0–8.0)

## 2021-05-10 LAB — SARS CORONAVIRUS 2 (TAT 6-24 HRS): SARS Coronavirus 2: NEGATIVE

## 2021-05-13 MED ORDER — ORAL CARE MOUTH RINSE: 15.0000 mL | Freq: Once | OROMUCOSAL | Status: AC

## 2021-05-13 MED ORDER — CEFAZOLIN SODIUM-DEXTROSE 2-4 GM/100ML-% IV SOLN
2.0000 g | INTRAVENOUS | Status: AC
  Administered 2021-05-14: 2 g via INTRAVENOUS

## 2021-05-13 MED ORDER — CHLORHEXIDINE GLUCONATE 0.12 % MT SOLN: 15.0000 mL | Freq: Once | OROMUCOSAL | Status: AC

## 2021-05-13 MED ORDER — SODIUM CHLORIDE 0.9 % IV SOLN: INTRAVENOUS | Status: DC

## 2021-05-14 ENCOUNTER — Encounter: Payer: Self-pay | Admitting: Orthopedic Surgery

## 2021-05-14 ENCOUNTER — Inpatient Hospital Stay
Admission: RE | Admit: 2021-05-14 | Discharge: 2021-05-16 | DRG: 467 | Disposition: A | Discharge status: Home Health | Payer: Medicare Other | Attending: Orthopedic Surgery | Admitting: Orthopedic Surgery

## 2021-05-14 ENCOUNTER — Encounter
Admission: RE | Disposition: A | Discharge status: Home Health | Payer: Self-pay | Source: Home / Self Care | Attending: Orthopedic Surgery

## 2021-05-14 ENCOUNTER — Other Ambulatory Visit: Payer: Self-pay

## 2021-05-14 ENCOUNTER — Inpatient Hospital Stay: Payer: Medicare Other | Admitting: Certified Registered"

## 2021-05-14 ENCOUNTER — Inpatient Hospital Stay: Payer: Medicare Other

## 2021-05-14 DIAGNOSIS — Z841 Family history of disorders of kidney and ureter: Secondary | ICD-10-CM | POA: Diagnosis not present

## 2021-05-14 DIAGNOSIS — Z8052 Family history of malignant neoplasm of bladder: Secondary | ICD-10-CM

## 2021-05-14 DIAGNOSIS — K219 Gastro-esophageal reflux disease without esophagitis: Secondary | ICD-10-CM | POA: Diagnosis present

## 2021-05-14 DIAGNOSIS — Z79891 Long term (current) use of opiate analgesic: Secondary | ICD-10-CM | POA: Diagnosis not present

## 2021-05-14 DIAGNOSIS — Z823 Family history of stroke: Secondary | ICD-10-CM

## 2021-05-14 DIAGNOSIS — Z833 Family history of diabetes mellitus: Secondary | ICD-10-CM | POA: Diagnosis not present

## 2021-05-14 DIAGNOSIS — Z881 Allergy status to other antibiotic agents status: Secondary | ICD-10-CM

## 2021-05-14 DIAGNOSIS — Z791 Long term (current) use of non-steroidal anti-inflammatories (NSAID): Secondary | ICD-10-CM | POA: Diagnosis not present

## 2021-05-14 DIAGNOSIS — M5030 Other cervical disc degeneration, unspecified cervical region: Secondary | ICD-10-CM | POA: Diagnosis present

## 2021-05-14 DIAGNOSIS — Z8042 Family history of malignant neoplasm of prostate: Secondary | ICD-10-CM

## 2021-05-14 DIAGNOSIS — M5116 Intervertebral disc disorders with radiculopathy, lumbar region: Secondary | ICD-10-CM | POA: Diagnosis present

## 2021-05-14 DIAGNOSIS — E785 Hyperlipidemia, unspecified: Secondary | ICD-10-CM | POA: Diagnosis present

## 2021-05-14 DIAGNOSIS — Z801 Family history of malignant neoplasm of trachea, bronchus and lung: Secondary | ICD-10-CM

## 2021-05-14 DIAGNOSIS — Z8262 Family history of osteoporosis: Secondary | ICD-10-CM | POA: Diagnosis not present

## 2021-05-14 DIAGNOSIS — Z808 Family history of malignant neoplasm of other organs or systems: Secondary | ICD-10-CM

## 2021-05-14 DIAGNOSIS — Z818 Family history of other mental and behavioral disorders: Secondary | ICD-10-CM | POA: Diagnosis not present

## 2021-05-14 DIAGNOSIS — E119 Type 2 diabetes mellitus without complications: Secondary | ICD-10-CM | POA: Diagnosis present

## 2021-05-14 DIAGNOSIS — Z820 Family history of epilepsy and other diseases of the nervous system: Secondary | ICD-10-CM

## 2021-05-14 DIAGNOSIS — Z800 Family history of malignant neoplasm of digestive organs: Secondary | ICD-10-CM | POA: Diagnosis not present

## 2021-05-14 DIAGNOSIS — Z79899 Other long term (current) drug therapy: Secondary | ICD-10-CM

## 2021-05-14 DIAGNOSIS — Z8249 Family history of ischemic heart disease and other diseases of the circulatory system: Secondary | ICD-10-CM

## 2021-05-14 DIAGNOSIS — G8929 Other chronic pain: Secondary | ICD-10-CM | POA: Diagnosis present

## 2021-05-14 DIAGNOSIS — Z96651 Presence of right artificial knee joint: Secondary | ICD-10-CM

## 2021-05-14 DIAGNOSIS — Z888 Allergy status to other drugs, medicaments and biological substances status: Secondary | ICD-10-CM

## 2021-05-14 DIAGNOSIS — G4700 Insomnia, unspecified: Secondary | ICD-10-CM | POA: Diagnosis present

## 2021-05-14 DIAGNOSIS — Z7951 Long term (current) use of inhaled steroids: Secondary | ICD-10-CM

## 2021-05-14 DIAGNOSIS — F3181 Bipolar II disorder: Secondary | ICD-10-CM | POA: Diagnosis present

## 2021-05-14 DIAGNOSIS — Z7984 Long term (current) use of oral hypoglycemic drugs: Secondary | ICD-10-CM | POA: Diagnosis not present

## 2021-05-14 DIAGNOSIS — G8918 Other acute postprocedural pain: Secondary | ICD-10-CM

## 2021-05-14 DIAGNOSIS — T84032A Mechanical loosening of internal right knee prosthetic joint, initial encounter: Secondary | ICD-10-CM | POA: Diagnosis present

## 2021-05-14 HISTORY — PX: TOTAL KNEE ARTHROPLASTY: SHX125

## 2021-05-14 LAB — CBC
HCT: 39.2 % (ref 39.0–52.0)
Hemoglobin: 13.1 g/dL (ref 13.0–17.0)
MCH: 30.6 pg (ref 26.0–34.0)
MCHC: 33.4 g/dL (ref 30.0–36.0)
MCV: 91.6 fL (ref 80.0–100.0)
Platelets: 169 10*3/uL (ref 150–400)
RBC: 4.28 MIL/uL (ref 4.22–5.81)
RDW: 12.2 % (ref 11.5–15.5)
WBC: 7.6 10*3/uL (ref 4.0–10.5)
nRBC: 0 % (ref 0.0–0.2)

## 2021-05-14 LAB — CREATININE, SERUM
Creatinine, Ser: 0.92 mg/dL (ref 0.61–1.24)
GFR, Estimated: >60 mL/min (ref >60)

## 2021-05-14 LAB — GLUCOSE, CAPILLARY
Glucose-Capillary: 151 mg/dL — ABNORMAL HIGH (ref 70–99)
Glucose-Capillary: 145 mg/dL — ABNORMAL HIGH (ref 70–99)
Glucose-Capillary: 107 mg/dL — ABNORMAL HIGH (ref 70–99)
Glucose-Capillary: 193 mg/dL — ABNORMAL HIGH (ref 70–99)

## 2021-05-14 SURGERY — ARTHROPLASTY, KNEE, TOTAL
Anesthesia: Spinal | Site: Knee | Laterality: Right

## 2021-05-14 MED ORDER — BUPIVACAINE-EPINEPHRINE (PF) 0.25% -1:200000 IJ SOLN
INTRAMUSCULAR | Status: DC | PRN
  Administered 2021-05-14: 30 mL

## 2021-05-14 MED ORDER — CHLORHEXIDINE GLUCONATE 0.12 % MT SOLN
OROMUCOSAL | Status: AC
  Administered 2021-05-14: 15 mL via OROMUCOSAL
  Filled 2021-05-14: qty 15

## 2021-05-14 MED ORDER — OXYCODONE HCL 5 MG PO TABS
10.0000 mg | ORAL_TABLET | ORAL | Status: DC | PRN
  Administered 2021-05-14 – 2021-05-16 (×7): 15 mg via ORAL
  Filled 2021-05-14 (×7): qty 3

## 2021-05-14 MED ORDER — ONDANSETRON HCL 4 MG/2ML IJ SOLN: 4.0000 mg | Freq: Once | INTRAMUSCULAR | Status: DC | PRN

## 2021-05-14 MED ORDER — PANTOPRAZOLE SODIUM 40 MG PO TBEC
40.0000 mg | DELAYED_RELEASE_TABLET | Freq: Two times a day (BID) | ORAL | Status: DC
  Administered 2021-05-14 – 2021-05-16 (×4): 40 mg via ORAL
  Filled 2021-05-14 (×4): qty 1

## 2021-05-14 MED ORDER — BUPIVACAINE-EPINEPHRINE (PF) 0.25% -1:200000 IJ SOLN
INTRAMUSCULAR | Status: AC
  Filled 2021-05-14: qty 30

## 2021-05-14 MED ORDER — ALFUZOSIN HCL ER 10 MG PO TB24
10.0000 mg | ORAL_TABLET | Freq: Every day | ORAL | Status: DC
  Administered 2021-05-15 – 2021-05-16 (×2): 10 mg via ORAL
  Filled 2021-05-14 (×3): qty 1

## 2021-05-14 MED ORDER — MENTHOL 3 MG MT LOZG
1.0000 | LOZENGE | OROMUCOSAL | Status: DC | PRN
  Filled 2021-05-14: qty 9

## 2021-05-14 MED ORDER — DIPHENHYDRAMINE HCL 12.5 MG/5ML PO ELIX
12.5000 mg | ORAL_SOLUTION | ORAL | Status: DC | PRN
Start: 2021-05-14 — End: 2021-05-16

## 2021-05-14 MED ORDER — POLYETHYLENE GLYCOL 3350 17 G PO PACK
17.0000 g | PACK | Freq: Every day | ORAL | Status: DC | PRN
  Administered 2021-05-16: 17 g via ORAL
  Filled 2021-05-14: qty 1

## 2021-05-14 MED ORDER — OXYCODONE HCL 5 MG PO TABS
5.0000 mg | ORAL_TABLET | ORAL | Status: DC | PRN
Start: 2021-05-14 — End: 2021-05-16

## 2021-05-14 MED ORDER — BUPIVACAINE LIPOSOME 1.3 % IJ SUSP
INTRAMUSCULAR | Status: AC
  Filled 2021-05-14: qty 20

## 2021-05-14 MED ORDER — MONTELUKAST SODIUM 10 MG PO TABS
10.0000 mg | ORAL_TABLET | Freq: Every day | ORAL | Status: DC
  Administered 2021-05-14 – 2021-05-15 (×2): 10 mg via ORAL
  Filled 2021-05-14 (×2): qty 1

## 2021-05-14 MED ORDER — ONDANSETRON HCL 4 MG PO TABS
4.0000 mg | ORAL_TABLET | Freq: Four times a day (QID) | ORAL | Status: DC | PRN
Start: 2021-05-14 — End: 2021-05-16

## 2021-05-14 MED ORDER — DOCUSATE SODIUM 100 MG PO CAPS
100.0000 mg | ORAL_CAPSULE | Freq: Two times a day (BID) | ORAL | Status: DC
  Administered 2021-05-14 – 2021-05-16 (×4): 100 mg via ORAL
  Filled 2021-05-14 (×4): qty 1

## 2021-05-14 MED ORDER — FENTANYL CITRATE (PF) 100 MCG/2ML IJ SOLN: 25.0000 ug | INTRAMUSCULAR | Status: DC | PRN

## 2021-05-14 MED ORDER — ENOXAPARIN SODIUM 30 MG/0.3ML IJ SOSY
30.0000 mg | PREFILLED_SYRINGE | Freq: Two times a day (BID) | INTRAMUSCULAR | Status: DC
  Administered 2021-05-15 – 2021-05-16 (×3): 30 mg via SUBCUTANEOUS
  Filled 2021-05-14 (×3): qty 0.3

## 2021-05-14 MED ORDER — MORPHINE SULFATE (PF) 10 MG/ML IV SOLN
INTRAVENOUS | Status: DC | PRN
  Administered 2021-05-14: 10 mg

## 2021-05-14 MED ORDER — MIDAZOLAM HCL 5 MG/5ML IJ SOLN
INTRAMUSCULAR | Status: DC | PRN
  Administered 2021-05-14: 2 mg via INTRAVENOUS

## 2021-05-14 MED ORDER — HYDROMORPHONE HCL 1 MG/ML IJ SOLN
0.5000 mg | INTRAMUSCULAR | Status: DC | PRN
  Administered 2021-05-14: 1 mg via INTRAVENOUS
  Filled 2021-05-14: qty 1

## 2021-05-14 MED ORDER — VANCOMYCIN HCL 1000 MG IV SOLR
INTRAVENOUS | Status: AC
  Filled 2021-05-14: qty 1000

## 2021-05-14 MED ORDER — ACETAMINOPHEN 10 MG/ML IV SOLN
INTRAVENOUS | Status: DC | PRN
  Administered 2021-05-14: 1000 mg via INTRAVENOUS

## 2021-05-14 MED ORDER — SODIUM CHLORIDE 0.9 % IV SOLN: INTRAVENOUS | Status: DC

## 2021-05-14 MED ORDER — CEFAZOLIN SODIUM-DEXTROSE 2-4 GM/100ML-% IV SOLN
2.0000 g | Freq: Four times a day (QID) | INTRAVENOUS | Status: AC
  Administered 2021-05-14 (×2): 2 g via INTRAVENOUS
  Filled 2021-05-14 (×3): qty 100

## 2021-05-14 MED ORDER — MAGNESIUM CITRATE PO SOLN
1.0000 | Freq: Once | ORAL | Status: DC | PRN
  Filled 2021-05-14: qty 296

## 2021-05-14 MED ORDER — ALUM & MAG HYDROXIDE-SIMETH 200-200-20 MG/5ML PO SUSP: 30.0000 mL | ORAL | Status: DC | PRN

## 2021-05-14 MED ORDER — ZOLPIDEM TARTRATE 5 MG PO TABS: 5.0000 mg | ORAL_TABLET | Freq: Every evening | ORAL | Status: DC | PRN

## 2021-05-14 MED ORDER — PROPOFOL 500 MG/50ML IV EMUL
INTRAVENOUS | Status: DC | PRN
  Administered 2021-05-14: 100 ug/kg/min via INTRAVENOUS

## 2021-05-14 MED ORDER — EMPAGLIFLOZIN 10 MG PO TABS
10.0000 mg | ORAL_TABLET | Freq: Every day | ORAL | Status: DC
  Administered 2021-05-15 – 2021-05-16 (×2): 10 mg via ORAL
  Filled 2021-05-14 (×2): qty 1

## 2021-05-14 MED ORDER — MORPHINE SULFATE (PF) 10 MG/ML IV SOLN
INTRAVENOUS | Status: AC
  Filled 2021-05-14: qty 1

## 2021-05-14 MED ORDER — DULOXETINE HCL 20 MG PO CPEP
20.0000 mg | ORAL_CAPSULE | Freq: Every day | ORAL | Status: DC
  Administered 2021-05-15 – 2021-05-16 (×2): 20 mg via ORAL
  Filled 2021-05-14 (×2): qty 1

## 2021-05-14 MED ORDER — FLUOXETINE HCL 20 MG PO CAPS
20.0000 mg | ORAL_CAPSULE | Freq: Every day | ORAL | Status: DC
  Administered 2021-05-15 – 2021-05-16 (×2): 20 mg via ORAL
  Filled 2021-05-14 (×2): qty 1

## 2021-05-14 MED ORDER — METHOCARBAMOL 1000 MG/10ML IJ SOLN
500.0000 mg | Freq: Four times a day (QID) | INTRAVENOUS | Status: DC | PRN
  Filled 2021-05-14: qty 5

## 2021-05-14 MED ORDER — INSULIN ASPART 100 UNIT/ML IJ SOLN
0.0000 [IU] | Freq: Three times a day (TID) | INTRAMUSCULAR | Status: DC
  Administered 2021-05-15: 1 [IU] via SUBCUTANEOUS
  Administered 2021-05-15: 2 [IU] via SUBCUTANEOUS
  Administered 2021-05-15: 3 [IU] via SUBCUTANEOUS
  Administered 2021-05-16 (×2): 2 [IU] via SUBCUTANEOUS
  Filled 2021-05-14 (×5): qty 1

## 2021-05-14 MED ORDER — PANTOPRAZOLE SODIUM 40 MG PO TBEC: 40.0000 mg | DELAYED_RELEASE_TABLET | Freq: Every day | ORAL | Status: DC

## 2021-05-14 MED ORDER — METOCLOPRAMIDE HCL 5 MG/ML IJ SOLN
5.0000 mg | Freq: Three times a day (TID) | INTRAMUSCULAR | Status: DC | PRN
Start: 2021-05-14 — End: 2021-05-16

## 2021-05-14 MED ORDER — ALBUTEROL SULFATE HFA 108 (90 BASE) MCG/ACT IN AERS: 1.0000 | INHALATION_SPRAY | RESPIRATORY_TRACT | Status: DC | PRN

## 2021-05-14 MED ORDER — MELATONIN 5 MG PO TABS
2.5000 mg | ORAL_TABLET | Freq: Every day | ORAL | Status: DC
  Administered 2021-05-14 – 2021-05-15 (×2): 2.5 mg via ORAL
  Filled 2021-05-14 (×2): qty 1

## 2021-05-14 MED ORDER — PROPRANOLOL HCL 20 MG PO TABS
10.0000 mg | ORAL_TABLET | Freq: Two times a day (BID) | ORAL | Status: DC
  Administered 2021-05-14 – 2021-05-16 (×4): 10 mg via ORAL
  Filled 2021-05-14 (×4): qty 1

## 2021-05-14 MED ORDER — FENTANYL CITRATE (PF) 100 MCG/2ML IJ SOLN
INTRAMUSCULAR | Status: AC
  Filled 2021-05-14: qty 2

## 2021-05-14 MED ORDER — CEFAZOLIN SODIUM-DEXTROSE 2-4 GM/100ML-% IV SOLN
INTRAVENOUS | Status: AC
  Filled 2021-05-14: qty 100

## 2021-05-14 MED ORDER — ATORVASTATIN CALCIUM 20 MG PO TABS
20.0000 mg | ORAL_TABLET | Freq: Every day | ORAL | Status: DC
  Administered 2021-05-14 – 2021-05-15 (×2): 20 mg via ORAL
  Filled 2021-05-14 (×2): qty 1

## 2021-05-14 MED ORDER — METOCLOPRAMIDE HCL 10 MG PO TABS: 5.0000 mg | ORAL_TABLET | Freq: Three times a day (TID) | ORAL | Status: DC | PRN

## 2021-05-14 MED ORDER — ONDANSETRON HCL 4 MG/2ML IJ SOLN: 4.0000 mg | Freq: Four times a day (QID) | INTRAMUSCULAR | Status: DC | PRN

## 2021-05-14 MED ORDER — SURGIPHOR WOUND IRRIGATION SYSTEM - OPTIME
TOPICAL | Status: DC | PRN
  Administered 2021-05-14: 1 via TOPICAL

## 2021-05-14 MED ORDER — BUPIVACAINE HCL (PF) 0.5 % IJ SOLN
INTRAMUSCULAR | Status: DC | PRN
  Administered 2021-05-14: 3 mL via INTRATHECAL

## 2021-05-14 MED ORDER — GALCANEZUMAB-GNLM 120 MG/ML ~~LOC~~ SOAJ: 120.0000 mg | SUBCUTANEOUS | Status: DC

## 2021-05-14 MED ORDER — DIPHENHYDRAMINE HCL 50 MG/ML IJ SOLN
INTRAMUSCULAR | Status: DC | PRN
  Administered 2021-05-14: 12.5 mg via INTRAVENOUS

## 2021-05-14 MED ORDER — DIVALPROEX SODIUM ER 250 MG PO TB24
250.0000 mg | ORAL_TABLET | Freq: Two times a day (BID) | ORAL | Status: DC
  Administered 2021-05-14 – 2021-05-16 (×4): 250 mg via ORAL
  Filled 2021-05-14 (×6): qty 1

## 2021-05-14 MED ORDER — SODIUM CHLORIDE 0.9 % IV SOLN
INTRAVENOUS | Status: DC | PRN
  Administered 2021-05-14: 60 mL

## 2021-05-14 MED ORDER — GABAPENTIN 300 MG PO CAPS
600.0000 mg | ORAL_CAPSULE | Freq: Three times a day (TID) | ORAL | Status: DC
  Administered 2021-05-14 – 2021-05-16 (×7): 600 mg via ORAL
  Filled 2021-05-14 (×3): qty 2
  Filled 2021-05-14: qty 6
  Filled 2021-05-14 (×3): qty 2

## 2021-05-14 MED ORDER — ACETAMINOPHEN 500 MG PO TABS
1000.0000 mg | ORAL_TABLET | Freq: Four times a day (QID) | ORAL | Status: AC
  Administered 2021-05-14 – 2021-05-15 (×4): 1000 mg via ORAL
  Filled 2021-05-14 (×4): qty 2

## 2021-05-14 MED ORDER — NORTRIPTYLINE HCL 10 MG PO CAPS
20.0000 mg | ORAL_CAPSULE | Freq: Every day | ORAL | Status: DC
  Administered 2021-05-14 – 2021-05-15 (×2): 20 mg via ORAL
  Filled 2021-05-14 (×3): qty 2

## 2021-05-14 MED ORDER — ACETAMINOPHEN 325 MG PO TABS: 325.0000 mg | ORAL_TABLET | Freq: Four times a day (QID) | ORAL | Status: DC | PRN

## 2021-05-14 MED ORDER — SODIUM CHLORIDE FLUSH 0.9 % IV SOLN
INTRAVENOUS | Status: AC
  Filled 2021-05-14: qty 40

## 2021-05-14 MED ORDER — ONDANSETRON HCL 4 MG/2ML IJ SOLN
INTRAMUSCULAR | Status: DC | PRN
  Administered 2021-05-14: 4 mg via INTRAVENOUS

## 2021-05-14 MED ORDER — FENTANYL CITRATE (PF) 100 MCG/2ML IJ SOLN
INTRAMUSCULAR | Status: DC | PRN
  Administered 2021-05-14 (×2): 50 ug via INTRAVENOUS

## 2021-05-14 MED ORDER — TRAZODONE HCL 50 MG PO TABS
50.0000 mg | ORAL_TABLET | Freq: Every day | ORAL | Status: DC
  Administered 2021-05-14 – 2021-05-15 (×2): 50 mg via ORAL
  Filled 2021-05-14 (×2): qty 1

## 2021-05-14 MED ORDER — MIDAZOLAM HCL 2 MG/2ML IJ SOLN
INTRAMUSCULAR | Status: AC
  Filled 2021-05-14: qty 2

## 2021-05-14 MED ORDER — ACETAMINOPHEN 10 MG/ML IV SOLN
INTRAVENOUS | Status: AC
  Filled 2021-05-14: qty 100

## 2021-05-14 MED ORDER — METHOCARBAMOL 500 MG PO TABS
500.0000 mg | ORAL_TABLET | Freq: Four times a day (QID) | ORAL | Status: DC | PRN
  Administered 2021-05-14 – 2021-05-15 (×2): 500 mg via ORAL
  Filled 2021-05-14 (×3): qty 1

## 2021-05-14 MED ORDER — BISACODYL 10 MG RE SUPP
10.0000 mg | Freq: Every day | RECTAL | Status: DC | PRN
Start: 2021-05-14 — End: 2021-05-16
  Administered 2021-05-16: 10 mg via RECTAL
  Filled 2021-05-14: qty 1

## 2021-05-14 MED ORDER — PHENYLEPHRINE HCL (PRESSORS) 10 MG/ML IV SOLN
INTRAVENOUS | Status: DC | PRN
  Administered 2021-05-14: 100 ug via INTRAVENOUS

## 2021-05-14 MED ORDER — ADULT MULTIVITAMIN W/MINERALS CH
1.0000 | ORAL_TABLET | Freq: Every day | ORAL | Status: DC
  Administered 2021-05-15 – 2021-05-16 (×2): 1 via ORAL
  Filled 2021-05-14 (×2): qty 1

## 2021-05-14 MED ORDER — ALBUTEROL SULFATE (2.5 MG/3ML) 0.083% IN NEBU: 2.5000 mg | INHALATION_SOLUTION | RESPIRATORY_TRACT | Status: DC | PRN

## 2021-05-14 MED ORDER — PHENOL 1.4 % MT LIQD
1.0000 | OROMUCOSAL | Status: DC | PRN
  Filled 2021-05-14: qty 177

## 2021-05-14 SURGICAL SUPPLY — 70 items
BLADE SAGITTAL 25.0X1.19X90 (BLADE) ×2 IMPLANT
BLADE SAGITTAL 25.0X1.19X90MM (BLADE) ×1
BLADE SAW 90X13X1.19 OSCILLAT (BLADE) ×3 IMPLANT
BNDG ELASTIC 6X5.8 VLCR STR LF (GAUZE/BANDAGES/DRESSINGS) ×3 IMPLANT
CANISTER SUCT 1200ML W/VALVE (MISCELLANEOUS) ×3 IMPLANT
CANISTER WOUND CARE 500ML ATS (WOUND CARE) ×3 IMPLANT
CEMENT HV SMART SET (Cement) ×6 IMPLANT
CHLORAPREP W/TINT 26 (MISCELLANEOUS) ×6 IMPLANT
CONE TIBIAL CENTRED XS H25 (Miscellaneous) ×3 IMPLANT
COOLER POLAR GLACIER W/PUMP (MISCELLANEOUS) ×3 IMPLANT
COVER WAND RF STERILE (DRAPES) ×3 IMPLANT
CUFF TOURN SGL QUICK 24 (TOURNIQUET CUFF)
CUFF TOURN SGL QUICK 34 (TOURNIQUET CUFF)
CUFF TRNQT CYL 24X4X16.5-23 (TOURNIQUET CUFF) IMPLANT
CUFF TRNQT CYL 34X4.125X (TOURNIQUET CUFF) IMPLANT
DRAPE 3/4 80X56 (DRAPES) ×6 IMPLANT
DRSG MEPILEX SACRM 8.7X9.8 (GAUZE/BANDAGES/DRESSINGS) ×3 IMPLANT
ELECT CAUTERY BLADE 6.4 (BLADE) ×3 IMPLANT
ELECT REM PT RETURN 9FT ADLT (ELECTROSURGICAL) ×3
ELECTRODE REM PT RTRN 9FT ADLT (ELECTROSURGICAL) ×1 IMPLANT
FEMORAL COMP SZ4 RIGHT SPHERE (Femur) ×3 IMPLANT
GAUZE SPONGE 4X4 12PLY STRL (GAUZE/BANDAGES/DRESSINGS) ×3 IMPLANT
GAUZE XEROFORM 1X8 LF (GAUZE/BANDAGES/DRESSINGS) ×3 IMPLANT
GLOVE SURG ORTHO LTX SZ8 (GLOVE) ×3 IMPLANT
GLOVE SURG SYN 9.0  PF PI (GLOVE) ×2
GLOVE SURG SYN 9.0 PF PI (GLOVE) ×1 IMPLANT
GLOVE SURG UNDER LTX SZ8 (GLOVE) ×3 IMPLANT
GLOVE SURG UNDER POLY LF SZ9 (GLOVE) ×3 IMPLANT
GOWN SRG 2XL LVL 4 RGLN SLV (GOWNS) ×1 IMPLANT
GOWN STRL NON-REIN 2XL LVL4 (GOWNS) ×2
GOWN STRL REUS W/ TWL LRG LVL3 (GOWN DISPOSABLE) ×1 IMPLANT
GOWN STRL REUS W/ TWL XL LVL3 (GOWN DISPOSABLE) ×1 IMPLANT
GOWN STRL REUS W/TWL LRG LVL3 (GOWN DISPOSABLE) ×2
GOWN STRL REUS W/TWL XL LVL3 (GOWN DISPOSABLE) ×2
HOLDER FOLEY CATH W/STRAP (MISCELLANEOUS) ×3 IMPLANT
HOOD PEEL AWAY FLYTE STAYCOOL (MISCELLANEOUS) ×6 IMPLANT
INSERT TIBIAL SZ4 RIGHT (Insert) ×3 IMPLANT
IRRIGATION SURGIPHOR STRL (IV SOLUTION) IMPLANT
IV NS IRRIG 3000ML ARTHROMATIC (IV SOLUTION) ×3 IMPLANT
KIT PREVENA INCISION MGT20CM45 (CANNISTER) ×3 IMPLANT
KIT STIMULAN RAPID CURE 5CC (Orthopedic Implant) ×3 IMPLANT
KIT TURNOVER KIT A (KITS) ×3 IMPLANT
MANIFOLD NEPTUNE II (INSTRUMENTS) ×3 IMPLANT
NDL SAFETY ECLIPSE 18X1.5 (NEEDLE) ×1 IMPLANT
NEEDLE HYPO 18GX1.5 SHARP (NEEDLE) ×2
NEEDLE SPNL 18GX3.5 QUINCKE PK (NEEDLE) ×3 IMPLANT
NEEDLE SPNL 20GX3.5 QUINCKE YW (NEEDLE) ×3 IMPLANT
NS IRRIG 1000ML POUR BTL (IV SOLUTION) ×3 IMPLANT
PACK TOTAL KNEE (MISCELLANEOUS) ×3 IMPLANT
PAD WRAPON POLAR KNEE (MISCELLANEOUS) ×1 IMPLANT
PENCIL SMOKE EVACUATOR COATED (MISCELLANEOUS) ×3 IMPLANT
PULSAVAC PLUS IRRIG FAN TIP (DISPOSABLE) ×3
SCALPEL PROTECTED #10 DISP (BLADE) ×6 IMPLANT
STAPLER SKIN PROX 35W (STAPLE) ×3 IMPLANT
STEM EXTENSION REV 10MM 105L (Stem) ×3 IMPLANT
SUCTION FRAZIER HANDLE 10FR (MISCELLANEOUS) ×2
SUCTION TUBE FRAZIER 10FR DISP (MISCELLANEOUS) ×1 IMPLANT
SUT DVC 2 QUILL PDO  T11 36X36 (SUTURE) ×2
SUT DVC 2 QUILL PDO T11 36X36 (SUTURE) ×1 IMPLANT
SUT ETHIBOND 2 V 37 (SUTURE) ×3 IMPLANT
SUT V-LOC 90 ABS DVC 3-0 CL (SUTURE) ×3 IMPLANT
SYR 20ML LL LF (SYRINGE) ×3 IMPLANT
SYR 50ML LL SCALE MARK (SYRINGE) ×6 IMPLANT
TIBIAL AUGMENT CABLE SZ4 5 (Cable) ×6 IMPLANT
TIP FAN IRRIG PULSAVAC PLUS (DISPOSABLE) ×1 IMPLANT
TOWEL OR 17X26 4PK STRL BLUE (TOWEL DISPOSABLE) ×3 IMPLANT
TOWER CARTRIDGE SMART MIX (DISPOSABLE) ×3 IMPLANT
TRAY FOLEY MTR SLVR 16FR STAT (SET/KITS/TRAYS/PACK) ×3 IMPLANT
TRAY TIBIAL REV R S4 (Knees) ×3 IMPLANT
WRAPON POLAR PAD KNEE (MISCELLANEOUS) ×3

## 2021-05-14 NOTE — Evaluation (Signed)
Physical Therapy Evaluation Patient Details Name: Daniel [name] MRN: [id number] DOB: 16-Oct-1971 Today's Date: 05/14/2021   History of Present Illness  Pt admitted for R TKR revision secondary to loosening components. Initial TKR 12/06/19. History includes bipolar, chest pain, DDD, DM, GERD, and HLD.  Clinical Impression  Pt is a pleasant 50 year old male who was admitted for R TKR revision. Pt performs bed mobility with supervision, transfers with cga, and ambulation with cga and RW. Pt demonstrates ability to perform 10 SLRs with independence, therefore does not require KI for mobility. Pt demonstrates deficits with strength/ROM/mobility. Pt is very motivated to participate despite having severe pain. Would benefit from skilled PT to address above deficits and promote optimal return to PLOF. Recommend transition to HHPT upon discharge from acute hospitalization.     Follow Up Recommendations Home health PT    Equipment Recommendations  None recommended by PT    Recommendations for Other Services       Precautions / Restrictions Precautions Precautions: Fall;Knee Precaution Booklet Issued: No Restrictions Weight Bearing Restrictions: Yes RLE Weight Bearing: Weight bearing as tolerated      Mobility  Bed Mobility Overal bed mobility: Needs Assistance Bed Mobility: Supine to Sit     Supine to sit: Supervision     General bed mobility comments: safe technique.    Transfers Overall transfer level: Needs assistance Equipment used: Rolling walker (2 wheeled) Transfers: Sit to/from Stand Sit to Stand: Min guard         General transfer comment: safe technique with upright posture. Educated on correct WBing status prior to mobility efforts  Ambulation/Gait Ambulation/Gait assistance: Min Paediatric nurse (Feet): 5 Feet Assistive device: Rolling walker (2 wheeled) Gait Pattern/deviations: Step-to pattern     General Gait Details: small step to gait pattern over to  recliner. 1 LOB with min assist for correction. Good use of RW  Stairs            Wheelchair Mobility    Modified Rankin (Stroke Patients Only)       Balance Overall balance assessment: Needs assistance Sitting-balance support: Bilateral upper extremity supported;Feet supported Sitting balance-Leahy Scale: Good     Standing balance support: Bilateral upper extremity supported Standing balance-Leahy Scale: Good                               Pertinent Vitals/Pain Pain Assessment: 0-10 Pain Score: 8  Pain Location: R knee Pain Descriptors / Indicators: Operative site guarding Pain Intervention(s): Limited activity within patient's tolerance;Ice applied;RN gave pain meds during session    Home Living Family/patient expects to be discharged to:: Private residence Living Arrangements: Non-relatives/Friends (lives with roommate) Available Help at Discharge: Friend(s);Available 24 hours/day Type of Home: House Home Access: Stairs to enter Entrance Stairs-Rails: Left Entrance Stairs-Number of Steps: 3 Home Layout: One level Home Equipment: Walker - 2 wheels;Cane - single point Additional Comments: plans to stay with friend who will provide 24/7 care    Prior Function Level of Independence: Independent               Hand Dominance        Extremity/Trunk Assessment   Upper Extremity Assessment Upper Extremity Assessment: Overall WFL for tasks assessed    Lower Extremity Assessment Lower Extremity Assessment: Generalized weakness (R LE grossly 3+/5; L LE grossly 5/5)       Communication   Communication: No difficulties  Cognition Arousal/Alertness: Awake/alert Behavior During  Therapy: WFL for tasks assessed/performed Overall Cognitive Status: Within Functional Limits for tasks assessed                                        General Comments      Exercises Total Joint Exercises Goniometric ROM: R knee AAROM: 1-74  degrees Other Exercises Other Exercises: supine ther-ex performed on R LE including AP, quad sets, SLRs, hip abd/add, and heel slides. 10 reps with cga   Assessment/Plan    PT Assessment Patient needs continued PT services  PT Problem List Decreased strength;Decreased range of motion;Decreased balance;Decreased mobility;Decreased knowledge of use of DME;Pain       PT Treatment Interventions DME instruction;Gait training;Therapeutic exercise;Balance training    PT Goals (Current goals can be found in the Care Plan section)  Acute Rehab PT Goals Patient Stated Goal: to go home PT Goal Formulation: With patient Time For Goal Achievement: 05/28/21 Potential to Achieve Goals: Good    Frequency BID   Barriers to discharge        Co-evaluation               AM-PAC PT "6 Clicks" Mobility  Outcome Measure Help needed turning from your back to your side while in a flat bed without using bedrails?: None Help needed moving from lying on your back to sitting on the side of a flat bed without using bedrails?: None Help needed moving to and from a bed to a chair (including a wheelchair)?: A Little Help needed standing up from a chair using your arms (e.g., wheelchair or bedside chair)?: A Little Help needed to walk in hospital room?: A Little Help needed climbing 3-5 steps with a railing? : A Little 6 Click Score: 20    End of Session Equipment Utilized During Treatment: Gait belt Activity Tolerance: Patient limited by pain Patient left: in chair;with chair alarm set;with SCD's reapplied Nurse Communication: Mobility status PT Visit Diagnosis: Difficulty in walking, not elsewhere classified (R26.2);Muscle weakness (generalized) (M62.81);Pain Pain - Right/Left: Right Pain - part of body: Knee    Time: 2671-2458 PT Time Calculation (min) (ACUTE ONLY): 24 min   Charges:   PT Evaluation $PT Eval Low Complexity: 1 Low PT Treatments $Therapeutic Exercise: 8-22 mins         Elizabeth Palau, PT, DPT [phone]   Deondria Puryear 05/14/2021, 4:52 PM

## 2021-05-14 NOTE — Anesthesia Procedure Notes (Signed)
Spinal  Patient location during procedure: OR Start time: 05/14/2021 9:20 AM End time: 05/14/2021 9:30 AM Reason for block: surgical anesthesia Staffing Performed: anesthesiologist  Anesthesiologist: Alver Fisher, MD Preanesthetic Checklist Completed: patient identified, IV checked, site marked, risks and benefits discussed, surgical consent, monitors and equipment checked, pre-op evaluation and timeout performed Spinal Block Patient position: sitting Prep: ChloraPrep Patient monitoring: heart rate, continuous pulse ox and blood pressure Approach: midline Location: L3-4 Injection technique: single-shot Needle Needle type: Introducer and Pencil-Tip  Needle gauge: 24 G Needle length: 9 cm Assessment Sensory level: T4 Events: CSF return

## 2021-05-14 NOTE — Anesthesia Preprocedure Evaluation (Signed)
Anesthesia Evaluation  Patient identified by MRN, date of birth, ID band Patient awake    Reviewed: Allergy & Precautions, NPO status , Patient's Chart, lab work & pertinent test results  History of Anesthesia Complications Negative for: history of anesthetic complications  Airway Mallampati: II  TM Distance: >3 FB Neck ROM: Full    Dental no notable dental hx.    Pulmonary asthma (mild intermittent) , neg sleep apnea, former smoker,    breath sounds clear to auscultation- rhonchi (-) wheezing      Cardiovascular Exercise Tolerance: Good (-) hypertension(-) CAD, (-) Past MI, (-) Cardiac Stents and (-) CABG  Rhythm:Regular Rate:Normal - Systolic murmurs and - Diastolic murmurs    Neuro/Psych  Headaches, neg Seizures PSYCHIATRIC DISORDERS Depression Bipolar Disorder    GI/Hepatic Neg liver ROS, GERD  ,  Endo/Other  diabetes  Renal/GU negative Renal ROS     Musculoskeletal  (+) Arthritis ,   Abdominal (+) - obese,   Peds  Hematology negative hematology ROS (+)   Anesthesia Other Findings Past Medical History: No date: Asthma No date: Bipolar 1 disorder (HCC) No date: Chronic back pain No date: DDD (degenerative disc disease), cervical No date: Depression No date: Diabetes mellitus without complication (HCC) No date: ED (erectile dysfunction) No date: Elevated lipids No date: GERD (gastroesophageal reflux disease) No date: Headache No date: Insomnia No date: Migraine No date: Seasonal allergies No date: Wheezing   Reproductive/Obstetrics                             Lab Results  Component Value Date   WBC 3.8 (L) 05/07/2021   HGB 13.8 05/07/2021   HCT 39.9 05/07/2021   MCV 87.9 05/07/2021   PLT 201 05/07/2021    Anesthesia Physical Anesthesia Plan  ASA: II  Anesthesia Plan: Spinal   Post-op Pain Management:    Induction:   PONV Risk Score and Plan: 1 and Propofol  infusion  Airway Management Planned: Natural Airway  Additional Equipment:   Intra-op Plan:   Post-operative Plan:   Informed Consent: I have reviewed the patients History and Physical, chart, labs and discussed the procedure including the risks, benefits and alternatives for the proposed anesthesia with the patient or authorized representative who has indicated his/her understanding and acceptance.     Dental advisory given  Plan Discussed with: CRNA and Anesthesiologist  Anesthesia Plan Comments:         Anesthesia Quick Evaluation

## 2021-05-14 NOTE — Op Note (Signed)
05/14/2021  12:00 PM  PATIENT:  [name]  50 y.o. 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, INITIAL ENCOUNTER  PROCEDURE:  Procedure(s): REVISION TOTAL KNEE ARTHROPLASTY - [place] Neighbor to Assist (Right)  SURGEON: Leitha Schuller, MD  ASSISTANTS: [place] Neighbor, PA-C  ANESTHESIA:   spinal  EBL:  Total I/O In: 900 [I.V.:800; IV Piggyback:100] Out: 350 [Urine:300; Blood:50]  BLOOD ADMINISTERED:none  DRAINS: Incisional wound VAC   LOCAL MEDICATIONS USED:  MARCAINE    and OTHER Exparel and morphine  SPECIMEN:  No Specimen  DISPOSITION OF SPECIMEN:  N/A  COUNTS:  YES  TOURNIQUET:   Total Tourniquet Time Documented: Thigh (Right) - 92 minutes Total: Thigh (Right) - 92 minutes   IMPLANTS: Medacta GM K sphere for right femur, 11 mm tibial insert size 4, 4 right tibial baseplate with 105 mm stem 5 mm medial and lateral augments and extra small 25 mm cone  DICTATION: .Dragon Dictation patient was brought to the operating room and after adequate spinal anesthesia was obtained the right leg was prepped and draped in usual sterile fashion with a tourniquet applied the upper thigh.  After patient identification and timeout procedures were completed tourniquet was raised and midline skin incision was made utilizing the prior incision.  Medial parapatellar arthrotomy performed and inspection of the knee revealed mild synovitis with minimal joint fluid nothing that appeared infected.  Initial part of the procedure involved excising scar tissue around the implants tibia and femoral components followed by removal of the plastic insert off the tibia.  Examination of the femur did reveal that there was loosening of the component and a thin flexible osteotome was used to remove it without any bone loss.  The cement was all still in place.  That the cement implant bond had broken  at some point during postoperatively and was a loose femoral component.  The tibial component was similarly removed again with delamination from the implant from the cement this was removed and then the extra medullary tibial alignment guide was used to do a freshen up cut on the proximal tibia following this the stem had broken off inside and this was removed after excising cement around the post it was removed and then tibial broaching was carried out.  A size 4 tibia seem to fit well and was cone drilling was carried out followed by trial placement with a 5 mm augments.  And the femoral component was placed after doing freshen up cuts on the distal femur and 4-in-1 cutting block cutting way the old cement with a size 4 femur.  An 11 mm insert gave good stability on trials.  Distal femoral drill holes were made and the notch cut created for the trochlear groove.  This point all components were removed and the knee thoroughly irrigated with infiltration of the above local throughout the joint.  When the cement was set and admixed the cone having been already impacted into position the tibial component was cemented into place with excess cement removed followed by placement of the 11 mm insert with setscrew using a torque screwdriver for femur impacted and then the knee held in extension prior to this the patella had been examined and there was no loosening apparent.  Betadine soak as the cement set and following this there is thorough irrigation of the joint the patella tracks somewhat laterally and so a lateral release  was carried out.  Tourniquet was let down and hemostasis checked electrocautery.  Stimulant beads with vancomycin were placed in the lateral gutter to aid in postop infection prevention.  The arthrotomy was.  Using #1 Ethibond along with a heavy Quill.  Subcutaneous closure with 3 -0 V-Loc followed by skin staples and Prevena incisional wound VAC.  PLAN OF CARE: Admit to inpatient   PATIENT  DISPOSITION:  PACU - hemodynamically stable.

## 2021-05-14 NOTE — Plan of Care (Signed)

## 2021-05-14 NOTE — H&P (Signed)
Chief Complaint  Patient presents with  . Pre-op Exam  Right TKA 05/14/21   History of the Present Illness: Daniel [name] is a 50 y.o. male here for history and physical for right total knee revision with Dr. Hessie Knows on 05/14/2021. Patient underwent right total knee arthroplasty 12/06/2019. He did well initially but had a SPECT scan that showed loosening. Sed rate and CRPs were negative showing no signs of infection. Patient's had progressive pain throughout the tibia and femur x1 year. Feels as if his knee will have sharp pain and discomfort as well as giving way sensation. His knee will have some swelling. Pain is located along the medial tibial plateau as well as the medial femoral condyle and lateral tibial plateau. He is taking meloxicam daily and Norco twice daily.  I have reviewed past medical, surgical, social and family history, and allergies as documented in the EMR.  Past Medical History: Past Medical History:  Diagnosis Date  . Allergic state 1972  Penicillin  . Bipolar affective disorder (CMS-HCC)  . Bipolar II disorder, mild, hypomanic, with mixed features, in full remission (CMS-HCC)  . Bronchitis, chronic (CMS-HCC)  . Chest pain  . Chronic back pain  On disability  . Chronic narcotic use  For back pain  . DDD (degenerative disc disease), lumbar 01/02/2015  . Depression 2010  . Diabetes mellitus type 2, uncomplicated (CMS-HCC)  . DM2 (diabetes mellitus, type 2) (CMS-HCC) 10/30/2013  On metformin, added lipitor 01/13/2014 to control LDL, The 10-year ASCVD risk score Mikey Bussing DC Jr., et al., 2013) is: 3.1%- did not recommend aspirin yet as risk score not over 10.  . Erectile dysfunction  . GERD (gastroesophageal reflux disease) 2014  . History of headache 02/15/2019  . Hyperlipidemia 06/26/2013  . Insomnia  . Low back pain 2005  Slipped and Herniated Disc  . Lumbar radiculitis 01/02/2015  . would consider routine STI screen, high risk behavior. 06/01/2018   Past Surgical  History: Past Surgical History:  Procedure Laterality Date  . BACK SURGERY 01/15/2017  Tailbone removed  . CHOLECYSTECTOMY  . COLONOSCOPY W/BIOPSY 03/04/2018  Procedure: COLONOSCOPY, FLEXIBLE; WITH BIOPSY, SINGLE OR MULTIPLE; Surgeon: Maylon Cos, MD; Location: [facility]; Service: Gastroenterology;;  . FRACTURE SURGERY 11/06/2016  Coccyx Surgury on 01/15/2017  . KNEE ARTHROSCOPY 2014  . Limited arthroscopic debridement, arthroscopic subacromial decompression, arthroscopic rotator cuff repair, arthroscopic excision of distal clavicle, and mini-open biceps tenodesis, right shoulder. Right 10/13/2017  Dr. Roland Rack   Past Family History: Family History  Problem Relation Age of Onset  . Alzheimer's disease Maternal Grandmother  . High blood pressure (Hypertension) Maternal Grandmother  Passed Away  . Alzheimer's disease Mother  . Depression Mother  . Diabetes type II Mother  . High blood pressure (Hypertension) Mother  . Osteoporosis (Thinning of bones) Mother  . Skin cancer Mother  Passed Away  . Other Mother  Bladder Cancer  . Kidney disease Mother  . Diabetes Mother  . Kidney disease Father  . Diabetes type II Father  Passed Away  . High blood pressure (Hypertension) Father  Passed Away  . Prostate cancer Father  Passed Away  . Skin cancer Father  Passed Away  . Stroke Father  Passed Away  . Cancer Father  . Diabetes Father  Praxair  . Colon cancer Father  . Diabetes type II Paternal Uncle  . Thyroid disease Paternal Grandmother  Praxair  . High blood pressure (Hypertension) Paternal Grandmother  Praxair  . Thyroid disease Paternal Aunt  .  Depression Paternal Grandfather  . Stroke Paternal Primary school teacher  . High blood pressure (Hypertension) Paternal Primary school teacher  . Diabetes type II Paternal Aunt  . Thyroid disease Paternal Aunt  . Diabetes Paternal Aunt  . High blood pressure (Hypertension) Paternal Aunt  .  High blood pressure (Hypertension) Maternal Uncle  . Skin cancer Maternal Uncle  . Diabetes type II Maternal Uncle  Passed Away  . Diabetes Maternal Uncle  Praxair  . Stroke Maternal Grandfather  Praxair  . Alzheimer's disease Maternal Grandfather  . High blood pressure (Hypertension) Maternal Grandfather  Passed Away  . Diabetes type II Maternal Uncle  Passed Away  . High blood pressure (Hypertension) Maternal Uncle  Passed Away  . Diabetes Maternal Uncle  Praxair  . Kidney disease Maternal Uncle  . Diabetes type II Paternal Press photographer  . Skin cancer Paternal Uncle  . Diabetes Paternal Press photographer  . High blood pressure (Hypertension) Paternal Uncle  . Lung cancer Maternal Aunt   Medications: Current Outpatient Medications Ordered in Epic  Medication Sig Dispense Refill  . albuterol (PROVENTIL) 2.5 mg /3 mL (0.083 %) nebulizer solution Take 3 mLs (2.5 mg total) by nebulization every 4 (four) hours as needed for Wheezing 900 mL 3  . albuterol 90 mcg/actuation inhaler Inhale 2 inhalations into the lungs every 4 (four) hours as needed for Wheezing 51 g 3  . alfuzosin (UROXATRAL) 10 mg ER tablet Take 1 tablet (10 mg total) by mouth once daily 90 tablet 3  . atorvastatin (LIPITOR) 20 MG tablet Take 1 tablet (20 mg total) by mouth once daily 90 tablet 2  . blood glucose diagnostic test strip Use 3 (three) times daily 300 strip 3  . blood glucose meter kit by XX route as directed E11.9 1 each 0  . blood sugar diagnostic, disc (BLOOD GLUCOSE DIAGNOSTIC, DISC) test strip Use 3 (three) times daily Use as instructed. E11.9 300 each 3  . blood-glucose meter (ONETOUCH VERIO METER) Misc by XX route as directed 1 each 0  . budesonide (PULMICORT) 1 mg/2 mL nebulizer solution USE 1 VIAL VIA NEBULIZER ONCE DAILY 1080 mL 0  . DESCOVY 200-25 mg tablet Take 1 tablet by mouth once daily  . divalproex (DEPAKOTE) 250 MG DR tablet TAKE 1 TABLET(250 MG) BY MOUTH THREE TIMES  DAILY 270 tablet 0  . EMGALITY PEN 120 mg/mL PnIj INJECT 120 MG SUBCUTANEOUSLY ONCE MONTHLY 3 mL 3  . empagliflozin (JARDIANCE) 10 mg tablet Take 1 tablet (10 mg total) by mouth once daily 90 tablet 2  . FLUoxetine (PROZAC) 20 MG capsule Take 1 capsule (20 mg total) by mouth once daily for 90 days 90 capsule 1  . FREESTYLE LIBRE 14 DAY SENSOR kit Use 1 kit every 14 (fourteen) days for glucose monitoring 1 kit 3  . gabapentin (NEURONTIN) 300 MG capsule TAKE 2 CAPSULES BY MOUTH 3 TIMES DAILY 540 capsule 1  . HYDROcodone-acetaminophen (NORCO) 5-325 mg tablet Take 1-2 tablets by mouth every 12 (twelve) hours as needed 20 tablet 0  . HYDROcodone-acetaminophen (NORCO) 5-325 mg tablet Take 1-2 tablets by mouth every 12 (twelve) hours as needed 10 tablet 0  . lancets 33 gauge Misc Inject 1 each subcutaneously 3 (three) times daily as needed 300 each 2  . melatonin 3 mg Tab 1 tab by mouth at bedtime  . meloxicam (MOBIC) 15 MG tablet TAKE 1 TABLET BY MOUTH ONCE DAILY 90 tablet 3  .  montelukast (SINGULAIR) 10 mg tablet TAKE 1 TABLET BY MOUTH AT NIGHT 30 tablet 0  . naloxone (NARCAN) 4 mg/actuation nasal spray 1 spray (4 mg total) by Intranasal route once as needed (if not breathing or overdose is suspected.) for up to 1 dose Give 2nd dose in 5-10 min if not responding or if sx return for up to 1 dose. 2 each 1  . nortriptyline (PAMELOR) 10 MG capsule TAKE 1 CAPSULE BY MOUTH NIGHTLY FOR 1 WEEK, THEN INCREASE TO 2 CAPSULES NIGHTLY 60 capsule 11  . pantoprazole (PROTONIX) 40 MG DR tablet Take 1 tablet (40 mg total) by mouth once daily 90 tablet 3  . propranoloL (INDERAL) 10 MG tablet TAKE 1 TABLET BY MOUTH TWICE DAILY 180 tablet 3  . pseudoephedrine-guaifenesin (MUCINEX D) 60-600 mg XR tablet Take 1 tablet by mouth every 12 (twelve) hours 180 tablet 0  . rizatriptan (MAXALT) 10 MG tablet Take 1 tab at headache onset can repeat once in 2 hours if needed no more than 2 doses in 24 hours 9 tablet 3  . syringe with  needle, safety (BD SAFETY-LOK DETACHABLE NEEDL) 3 mL 23 gauge x 1" Syrg Use 1 each as directed 100 each 2  . tadalafiL (CIALIS) 20 MG tablet Take 1 tablet by mouth as needed  . testosterone cypionate (DEPO-TESTOSTERONE) 200 mg/mL injection INJECT INTRAMUSCULARLY 0.5 ML EVERY 7 DAYS (DISCARD UNUSED PORTION AFTER FIRST USE) 5 mL 3  . traZODone (DESYREL) 50 MG tablet TAKE 1 TABLET(50 MG) BY MOUTH EVERY NIGHT 90 tablet 1  . zonisamide (ZONEGRAN) 100 MG capsule Take 1 capsule (100 mg total) by mouth once daily . 90 capsule 1   No current Epic-ordered facility-administered medications on file.   Allergies: Allergies  Allergen Reactions  . Doxycycline Vomiting  . Ketorolac Unknown  . Other Unknown  Mercaptobenzothiazole  . Other Omega-3s Other (See Comments)  Other reaction(s): Unknown Mercaptobenzothiazole  . Paraben Unknown  . Tramadol Itching  Tolerates extended release  . Penicillins Rash  Can take keflex    Body mass index is 28.06 kg/m.  Review of Systems: A comprehensive 14 point ROS was performed, reviewed, and the pertinent orthopaedic findings are documented in the HPI.  Vitals:  05/08/21 1019  BP: 118/70   General Physical Examination:  General:  Well developed, well nourished, no apparent distress, normal affect, normal gait with no antalgic component.   HEENT: Head normocephalic, atraumatic, PERRL.   Abdomen: Soft, non tender, non distended, Bowel sounds present.  Heart: Examination of the heart reveals regular, rate, and rhythm. There is no murmur noted on ascultation. There is a normal apical pulse.  Lungs: Lungs are clear to auscultation. There is no wheeze, rhonchi, or crackles. There is normal expansion of bilateral chest walls.   Musculoskeletal Examination: Right knee shows previous incision site is completely healed. Mild swelling with effusion. Knee is stable to valgus and varus stress testing with negative Homans' sign. Normal hip internal and  external rotation with no discomfort.  Radiographs: AP, lateral, standing, and sunrise x-rays of the right knee were reviewed from August 2021. These show no evidence of loosening.  A SPECT scan obtained on 07/23/2020 was reviewed today and was positive for possible loosening versus infection with increased uptake around the implants.  Assessment: ICD-10-CM  1. Pain due to total right knee replacement, initial encounter (CMS-HCC) T84.84XA  Z96.651   Plan: 41. 50 year old male status post right total knee arthroplasty in 2020. Initially did well but complained of increasing  pain and discomfort. SPECT scan showed loosening of the components. Patient with negative work-up for infection. Pain is progressively been increasing. Risks, benefits, complications of a right total knee revision of all components have been discussed with the patient. Patient has agreed and consented the procedure with Dr. Hessie Knows on 05/14/2021   Electronically signed by Feliberto Gottron, [place] at 05/08/2021 10:54 AM EDT  Reviewed  H+P. No changes noted.

## 2021-05-14 NOTE — Transfer of Care (Signed)
Immediate Anesthesia Transfer of Care Note  Patient: Daniel [name]  Procedure(s) Performed: REVISION TOTAL KNEE ARTHROPLASTY - [place] Neighbor to Assist (Right Knee)  Patient Location: PACU  Anesthesia Type:Spinal  Level of Consciousness: drowsy  Airway & Oxygen Therapy: Patient connected to face mask  Post-op Assessment: Report given to RN and Post -op Vital signs reviewed and stable  Post vital signs: stable  Last Vitals:  Vitals Value Taken Time  BP 112/74 05/14/21 1200  Temp    Pulse 71 05/14/21 1202  Resp 16 05/14/21 1202  SpO2 100 % 05/14/21 1202  Vitals shown include unvalidated device data.  Last Pain:  Vitals:   05/14/21 0827  TempSrc: Temporal  PainSc: 0-No pain         Complications: No complications documented.

## 2021-05-15 LAB — BASIC METABOLIC PANEL
Anion gap: 5 (ref 5–15)
BUN: 16 mg/dL (ref 6–20)
CO2: 28 mmol/L (ref 22–32)
Calcium: 8.9 mg/dL (ref 8.9–10.3)
Chloride: 100 mmol/L (ref 98–111)
Creatinine, Ser: 0.86 mg/dL (ref 0.61–1.24)
GFR, Estimated: >60 mL/min (ref >60)
Glucose, Bld: 181 mg/dL — ABNORMAL HIGH (ref 70–99)
Potassium: 3.9 mmol/L (ref 3.5–5.1)
Sodium: 133 mmol/L — ABNORMAL LOW (ref 135–145)

## 2021-05-15 LAB — CBC
HCT: 36.4 % — ABNORMAL LOW (ref 39.0–52.0)
Hemoglobin: 12.4 g/dL — ABNORMAL LOW (ref 13.0–17.0)
MCH: 30.5 pg (ref 26.0–34.0)
MCHC: 34.1 g/dL (ref 30.0–36.0)
MCV: 89.7 fL (ref 80.0–100.0)
Platelets: 161 10*3/uL (ref 150–400)
RBC: 4.06 MIL/uL — ABNORMAL LOW (ref 4.22–5.81)
RDW: 12.1 % (ref 11.5–15.5)
WBC: 8.1 10*3/uL (ref 4.0–10.5)
nRBC: 0 % (ref 0.0–0.2)

## 2021-05-15 LAB — GLUCOSE, CAPILLARY
Glucose-Capillary: 150 mg/dL — ABNORMAL HIGH (ref 70–99)
Glucose-Capillary: 159 mg/dL — ABNORMAL HIGH (ref 70–99)
Glucose-Capillary: 225 mg/dL — ABNORMAL HIGH (ref 70–99)
Glucose-Capillary: 173 mg/dL — ABNORMAL HIGH (ref 70–99)

## 2021-05-15 NOTE — Evaluation (Signed)
Occupational Therapy Evaluation Patient Details Name: Daniel [name] MRN: [id number] DOB: 31-Jul-1971 Today's Date: 05/15/2021    History of Present Illness Pt admitted for R TKR revision secondary to loosening components. Initial TKR 12/06/19. History includes bipolar, chest pain, DDD, DM, GERD, and HLD.   Clinical Impression   Pt seen for OT evaluation this date, POD#1 from above surgery. Pt was independent in all ADLs prior to surgery, however occasionally using a SPC due to R knee pain. Pt is eager to return to PLOF with less pain and improved safety and independence. Pt currently requires minimal assist for LB dressing while in seated position due to pain and limited AROM of R knee. Endorses 7/10 pain this AM. Good technique with sit<>stand with RW, no LOB with t/fs, ambulation. Pt instructed in polar care mgt, falls prevention strategies, home/routines modifications, DME/AE for LB bathing and dressing tasks, and compression stocking mgt. Pt verbalizes understanding, provides teach-back. This is pt's 2nd TKA, and he states he has all needed DME and AE, is clear on post-surgery care plan once returning home. Pt to DC to a friend's home, with friend available 24/7 to meet any needs. Both pt and therapist in agreement that no further OT is necessary at this time.     Follow Up Recommendations  No OT follow up    Equipment Recommendations  None recommended by OT    Recommendations for Other Services       Precautions / Restrictions Precautions Precautions: Fall;Knee Restrictions Weight Bearing Restrictions: Yes RLE Weight Bearing: Weight bearing as tolerated      Mobility Bed Mobility Overal bed mobility: Needs Assistance Bed Mobility: Supine to Sit     Supine to sit: Modified independent (Device/Increase time)     General bed mobility comments: safe technique, fluid motion    Transfers Overall transfer level: Needs assistance Equipment used: Rolling walker (2  wheeled) Transfers: Sit to/from Stand Sit to Stand: Supervision         General transfer comment: SUPV for safety, pt had no difficulty coming into standing, using RW. Good technique    Balance Overall balance assessment: Needs assistance Sitting-balance support: No upper extremity supported;Feet supported Sitting balance-Leahy Scale: Good     Standing balance support: Bilateral upper extremity supported Standing balance-Leahy Scale: Good                             ADL either performed or assessed with clinical judgement   ADL Overall ADL's : Needs assistance/impaired Eating/Feeding: Independent       Upper Body Bathing: Independent           Lower Body Dressing: Minimal assistance                       Vision Patient Visual Report: No change from baseline       Perception     Praxis      Pertinent Vitals/Pain Pain Score: 7  Pain Location: R knee Pain Descriptors / Indicators: Operative site guarding Pain Intervention(s): Limited activity within patient's tolerance;Monitored during session;Premedicated before session;Repositioned     Hand Dominance     Extremity/Trunk Assessment Upper Extremity Assessment Upper Extremity Assessment: Overall WFL for tasks assessed   Lower Extremity Assessment Lower Extremity Assessment: RLE deficits/detail RLE Deficits / Details: s/p TKA RLE: Unable to fully assess due to pain RLE Sensation: WNL       Communication Communication Communication: No difficulties  Cognition   Behavior During Therapy: WFL for tasks assessed/performed Overall Cognitive Status: Within Functional Limits for tasks assessed                                     General Comments       Exercises Other Exercises Other Exercises: educ re polar care mgmt, TED hose mgmt, AE for LB dressing/bathing, routines/home modifications   Shoulder Instructions      Home Living Family/patient expects to be  discharged to:: Private residence Living Arrangements: Non-relatives/Friends Available Help at Discharge: Friend(s);Available 24 hours/day Type of Home: House Home Access: Stairs to enter [employer] of Steps: 3 Entrance Stairs-Rails: Left Home Layout: One level     Bathroom Shower/Tub: Producer, television/film/video: Standard     Home Equipment: Environmental consultant - 2 wheels;Cane - single point;Shower seat   Additional Comments: plans to stay with friend who will provide 24/7 care      Prior Functioning/Environment Level of Independence: Independent        Comments: Walks 1+ hours several times/wk (walks to work, because has no car at present); no fall history; works 5-6 hrs/day at [employer], standing the entire shift        OT Problem List: Decreased strength;Decreased knowledge of use of DME or AE;Decreased range of motion;Decreased activity tolerance;Impaired balance (sitting and/or standing);Pain      OT Treatment/Interventions:      OT Goals(Current goals can be found in the care plan section) Acute Rehab OT Goals Patient Stated Goal: to be able to walk without pain OT Goal Formulation: With patient Time For Goal Achievement: 05/29/21 Potential to Achieve Goals: Good  OT Frequency:     Barriers to D/C:            Co-evaluation              AM-PAC OT "6 Clicks" Daily Activity     Outcome Measure Help from another person eating meals?: None Help from another person taking care of personal grooming?: None Help from another person toileting, which includes using toliet, bedpan, or urinal?: A Little Help from another person bathing (including washing, rinsing, drying)?: A Little Help from another person to put on and taking off regular upper body clothing?: None Help from another person to put on and taking off regular lower body clothing?: A Little 6 Click Score: 21   End of Session Equipment Utilized During Treatment: Rolling walker  Activity  Tolerance: Patient tolerated treatment well Patient left: in chair;with call bell/phone within reach;with chair alarm set  OT Visit Diagnosis: Unsteadiness on feet (R26.81);Other abnormalities of gait and mobility (R26.89);Muscle weakness (generalized) (M62.81);Pain Pain - Right/Left: Right Pain - part of body: Knee                Time: 7169-6789 OT Time Calculation (min): 18 min Charges:  OT General Charges $OT Visit: 1 Visit OT Evaluation $OT Eval Low Complexity: 1 Low OT Treatments $Self Care/Home Management : 8-22 mins  Latina Craver, PhD, MS, OTR/L 05/15/21, 9:03 AM

## 2021-05-15 NOTE — TOC Progression Note (Signed)
Transition of Care [facility]) - Progression Note    Patient Details  Name: Daniel [name] MRN: [id number] Date of Birth: 1971/11/13  Transition of Care [facility]) CM/SW [place], RN Phone Number: 05/15/2021, 2:40 PM  Clinical Narrative:    Met with the patient in the room to discuss DC needs and plan He will be staying with family at [place] at [address] [place], I notified [facility] of the address, he has a RW at home and has transportation, he can afford his medications.          Expected Discharge Plan and Services                                                 Social Determinants of Health (SDOH) Interventions    Readmission Risk Interventions No flowsheet data found.

## 2021-05-15 NOTE — Anesthesia Postprocedure Evaluation (Signed)
Anesthesia Post Note  Patient: Daniel [name]  Procedure(s) Performed: REVISION TOTAL KNEE ARTHROPLASTY - [place] Neighbor to Assist (Right Knee)  Patient location during evaluation: Nursing Unit Anesthesia Type: Spinal Level of consciousness: awake Pain management: pain level controlled Respiratory status: spontaneous breathing Postop Assessment: no headache Anesthetic complications: no   No complications documented.   Last Vitals:  Vitals:   05/15/21 0040 05/15/21 0452  BP: 121/74 113/75  Pulse: 84 95  Resp: 18 18  Temp: 36.8 C 36.7 C  SpO2: 97% 96%    Last Pain:  Vitals:   05/15/21 0452  TempSrc: Oral  PainSc:                  [name]

## 2021-05-15 NOTE — Progress Notes (Signed)
Physical Therapy Treatment Patient Details Name: Daniel [name] MRN: [id number] DOB: 12/09/1970 Today's Date: 05/15/2021    History of Present Illness Pt admitted for R TKR revision secondary to loosening components. Initial TKR 12/06/19. History includes bipolar, chest pain, DDD, DM, GERD, and HLD.    PT Comments    Pt is making good progress towards goals with ability to ambulate around RN station (2nd time today) in addition to performing stairs. Still limited by pain this session, however stays motivated to participate. Good endurance with there-ex. Will continue to progress.   Follow Up Recommendations  Home health PT     Equipment Recommendations  None recommended by PT    Recommendations for Other Services       Precautions / Restrictions Precautions Precautions: Fall;Knee Precaution Booklet Issued: Yes (comment) Restrictions Weight Bearing Restrictions: Yes RLE Weight Bearing: Weight bearing as tolerated    Mobility  Bed Mobility Overal bed mobility: Needs Assistance Bed Mobility: Supine to Sit     Supine to sit: Min guard     General bed mobility comments: safe technique with slight assist for guidance of surgical leg    Transfers Overall transfer level: Needs assistance Equipment used: Rolling walker (2 wheeled) Transfers: Sit to/from Stand Sit to Stand: Supervision         General transfer comment: safe technique with cues for pushing from seated surface  Ambulation/Gait Ambulation/Gait assistance: Supervision Gait Distance (Feet): 300 Feet Assistive device: Rolling walker (2 wheeled) Gait Pattern/deviations: Step-to pattern     General Gait Details: keeps too close to RW, needs cues for sequencing and inconsistent reciprocal gait pattern. Slow gait pattern with antalgic gait.   Stairs Stairs: Yes Stairs assistance: Min assist Stair Management: One rail Left;Step to pattern;Forwards Number of Stairs: 4 General stair comments: up/down with  decreased eccentric control on surgery leg. Needs cues for sequencing   Wheelchair Mobility    Modified Rankin (Stroke Patients Only)       Balance Overall balance assessment: Needs assistance Sitting-balance support: No upper extremity supported;Feet supported Sitting balance-Leahy Scale: Good     Standing balance support: Bilateral upper extremity supported Standing balance-Leahy Scale: Good                              Cognition Arousal/Alertness: Awake/alert Behavior During Therapy: WFL for tasks assessed/performed Overall Cognitive Status: Within Functional Limits for tasks assessed                                        Exercises Other Exercises Other Exercises: supine ther-ex performed on R LE including AP, QS, SLRs, SAQ, and heel slides x 12 reps with cga.    General Comments        Pertinent Vitals/Pain Pain Assessment: 0-10 Pain Score: 7  Pain Location: R knee Pain Descriptors / Indicators: Operative site guarding Pain Intervention(s): Limited activity within patient's tolerance;Repositioned;Ice applied    Home Living                      Prior Function            PT Goals (current goals can now be found in the care plan section) Acute Rehab PT Goals Patient Stated Goal: to be able to walk without pain PT Goal Formulation: With patient Time For Goal Achievement: 05/28/21 Potential to  Achieve Goals: Good Progress towards PT goals: Progressing toward goals    Frequency    BID      PT Plan Current plan remains appropriate    Co-evaluation              AM-PAC PT "6 Clicks" Mobility   Outcome Measure  Help needed turning from your back to your side while in a flat bed without using bedrails?: None Help needed moving from lying on your back to sitting on the side of a flat bed without using bedrails?: None Help needed moving to and from a bed to a chair (including a wheelchair)?: A Little Help  needed standing up from a chair using your arms (e.g., wheelchair or bedside chair)?: A Little Help needed to walk in hospital room?: A Little Help needed climbing 3-5 steps with a railing? : A Little 6 Click Score: 20    End of Session Equipment Utilized During Treatment: Gait belt Activity Tolerance: Patient limited by pain Patient left: in bed;with bed alarm set Nurse Communication: Mobility status PT Visit Diagnosis: Difficulty in walking, not elsewhere classified (R26.2);Muscle weakness (generalized) (M62.81);Pain Pain - Right/Left: Right Pain - part of body: Knee     Time: 1347-1411 PT Time Calculation (min) (ACUTE ONLY): 24 min  Charges:  $Gait Training: 8-22 mins $Therapeutic Exercise: 8-22 mins                     Elizabeth Palau, PT, DPT [phone]    [name] 05/15/2021, 3:58 PM

## 2021-05-15 NOTE — Plan of Care (Signed)
  Problem: Health Behavior/Discharge Planning: Goal: Ability to manage health-related needs will improve Outcome: Progressing   Problem: Pain Managment: Goal: General experience of comfort will improve Outcome: Progressing   Problem: Safety: Goal: Ability to remain free from injury will improve Outcome: Progressing   

## 2021-05-15 NOTE — Progress Notes (Signed)
Physical Therapy Treatment Patient Details Name: Daniel [name] MRN: [id number] DOB: [birth year] Today's Date: 05/15/2021    History of Present Illness Pt admitted for R TKR revision secondary to loosening components. Initial TKR 12/06/19. History includes bipolar, chest pain, DDD, DM, GERD, and HLD.    PT Comments    Pt is making good progress towards goals with increased ambulation around nursing station using RW. Still requires cues for safety during ambulation. Good endurance with there-ex. Pt continues to be motivated to perform therapy. Will continue to progress as able.    Follow Up Recommendations  Home health PT     Equipment Recommendations  None recommended by PT    Recommendations for Other Services       Precautions / Restrictions Precautions Precautions: Fall;Knee Precaution Booklet Issued: Yes (comment) Restrictions Weight Bearing Restrictions: Yes RLE Weight Bearing: Weight bearing as tolerated    Mobility  Bed Mobility Overal bed mobility: Needs Assistance Bed Mobility: Supine to Sit     Supine to sit: Modified independent (Device/Increase time)     General bed mobility comments: not performed as received in recliner    Transfers Overall transfer level: Needs assistance Equipment used: Rolling walker (2 wheeled) Transfers: Sit to/from Stand Sit to Stand: Supervision         General transfer comment: safe technique with cues for pushing from seated surface. During stand->sit needs cues for eccentric control  Ambulation/Gait Ambulation/Gait assistance: Supervision Gait Distance (Feet): 200 Feet Assistive device: Rolling walker (2 wheeled) Gait Pattern/deviations: Step-to pattern     General Gait Details: ambulated around RN station with step to gait pattern, however does demonstrate inconsistent reciprocal gait pattern. Stays too close to RW with cues for correction.   Stairs             Wheelchair Mobility    Modified Rankin  (Stroke Patients Only)       Balance Overall balance assessment: Needs assistance Sitting-balance support: No upper extremity supported;Feet supported Sitting balance-Leahy Scale: Good     Standing balance support: Bilateral upper extremity supported Standing balance-Leahy Scale: Good                              Cognition Arousal/Alertness: Awake/alert Behavior During Therapy: WFL for tasks assessed/performed Overall Cognitive Status: Within Functional Limits for tasks assessed                                        Exercises Total Joint Exercises Goniometric ROM: R knee AAROM: 0-80 degrees Other Exercises Other Exercises: seated ther-ex performed on R LE including AP, quad sets, SLRs, hip abd/add, LAQ, and seated knee flexion 10 reps with cga Other Exercises: ambulation performed to bathrom with ability to transfer on/off BSC and urinate. supervision given for safety    General Comments        Pertinent Vitals/Pain Pain Assessment: 0-10 Pain Score: 7  Pain Location: R knee Pain Descriptors / Indicators: Operative site guarding Pain Intervention(s): Limited activity within patient's tolerance;Repositioned;Ice applied    Home Living Family/patient expects to be discharged to:: Private residence Living Arrangements: Non-relatives/Friends Available Help at Discharge: Friend(s);Available 24 hours/day Type of Home: House Home Access: Stairs to enter Entrance Stairs-Rails: Left Home Layout: One level Home Equipment: Walker - 2 wheels;Cane - single point;Shower seat Additional Comments: plans to stay with friend who will provide  24/7 care    Prior Function Level of Independence: Independent      Comments: Walks 1+ hours several times/wk (walks to work, because has no car at present); no fall history; 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, standing the entire shift   PT Goals (current goals can now be found in the care plan section) Acute Rehab  PT Goals Patient Stated Goal: to be able to walk without pain PT Goal Formulation: With patient Time For Goal Achievement: 05/28/21 Potential to Achieve Goals: Good Progress towards PT goals: Progressing toward goals    Frequency    BID      PT Plan Current plan remains appropriate    Co-evaluation              AM-PAC PT "6 Clicks" Mobility   Outcome Measure  Help needed turning from your back to your side while in a flat bed without using bedrails?: None Help needed moving from lying on your back to sitting on the side of a flat bed without using bedrails?: None Help needed moving to and from a bed to a chair (including a wheelchair)?: A Little Help needed standing up from a chair using your arms (e.g., wheelchair or bedside chair)?: A Little Help needed to walk in hospital room?: A Little Help needed climbing 3-5 steps with a railing? : A Little 6 Click Score: 20    End of Session Equipment Utilized During Treatment: Gait belt Activity Tolerance: Patient limited by pain Patient left: in chair;with chair alarm set;with SCD's reapplied Nurse Communication: Mobility status PT Visit Diagnosis: Difficulty in walking, not elsewhere classified (R26.2);Muscle weakness (generalized) (M62.81);Pain Pain - Right/Left: Right Pain - part of body: Knee     Time: 2536-6440 PT Time Calculation (min) (ACUTE ONLY): 27 min  Charges:  $Gait Training: 8-22 mins $Therapeutic Exercise: 8-22 mins                     Elizabeth Palau, PT, DPT [phone]    Al Gagen 05/15/2021, 12:18 PM

## 2021-05-15 NOTE — Progress Notes (Signed)
   Subjective: 1 Day Post-Op Procedure(s) (LRB): REVISION TOTAL KNEE ARTHROPLASTY - [place] Neighbor to Assist (Right) Patient reports pain as mild.   Patient is well, and has had no acute complaints or problems Denies any CP, SOB, ABD pain. We will continue therapy today.  Plan is to go Home after hospital stay.  Objective: Vital signs in last 24 hours: Temp:  [97.4 F (36.3 C)-98.3 F (36.8 C)] 98.1 F (36.7 C) (06/08 0741) Pulse Rate:  [60-95] 93 (06/08 0741) Resp:  [5-20] 17 (06/08 0741) BP: (99-125)/(71-90) 118/76 (06/08 0741) SpO2:  [95 %-100 %] 95 % (06/08 0741) Weight:  [84.8 kg] 84.8 kg (06/07 0827)  Intake/Output from previous day: 06/07 0701 - 06/08 0700 In: 2538.8 [P.O.:680; I.V.:1673.7; IV Piggyback:185] Out: 1925 [Urine:1875; Blood:50] Intake/Output this shift: No intake/output data recorded.  Recent Labs    05/14/21 1441 05/15/21 0614  HGB 13.1 12.4*   Recent Labs    05/14/21 1441 05/15/21 0614  WBC 7.6 8.1  RBC 4.28 4.06*  HCT 39.2 36.4*  PLT 169 161   Recent Labs    05/14/21 1441 05/15/21 0614  NA  --  133*  K  --  3.9  CL  --  100  CO2  --  28  BUN  --  16  CREATININE 0.92 0.86  GLUCOSE  --  181*  CALCIUM  --  8.9   No results for input(s): LABPT, INR in the last 72 hours.  EXAM General - Patient is Alert, Appropriate and Oriented Extremity - Neurovascular intact Sensation intact distally Intact pulses distally Dorsiflexion/Plantar flexion intact No cellulitis present Compartment soft Dressing - dressing C/D/I and no drainage, Praveena intact without drainage Motor Function - intact, moving foot and toes well on exam.   Past Medical History:  Diagnosis Date  . Asthma   . Bipolar 1 disorder (HCC)   . Chronic back pain   . DDD (degenerative disc disease), cervical   . Depression   . Diabetes mellitus without complication (HCC)   . ED (erectile dysfunction)   . Elevated lipids   . GERD (gastroesophageal reflux disease)   .  Headache   . Insomnia   . Migraine   . Seasonal allergies   . Wheezing     Assessment/Plan:   1 Day Post-Op Procedure(s) (LRB): REVISION TOTAL KNEE ARTHROPLASTY - [place] Neighbor to Assist (Right) Active Problems:   S/P revision of total knee, right  Estimated body mass index is 27.62 kg/m as calculated from the following:   Height as of this encounter: 5\' 9"  (1.753 m).   Weight as of this encounter: 84.8 kg. Advance diet Up with therapy  Labs and vital signs stable Pain well controlled Work on bowel movement Care manager to assist with discharge to home with home health PT likely tomorrow   DVT Prophylaxis - Lovenox, Foot Pumps and TED hose Weight-Bearing as tolerated to right leg   T. , PA-C [facility] 05/15/2021, 8:16 AM

## 2021-05-16 LAB — GLUCOSE, CAPILLARY
Glucose-Capillary: 154 mg/dL — ABNORMAL HIGH (ref 70–99)
Glucose-Capillary: 155 mg/dL — ABNORMAL HIGH (ref 70–99)

## 2021-05-16 MED ORDER — DOCUSATE SODIUM 100 MG PO CAPS: 100.0000 mg | ORAL_CAPSULE | Freq: Two times a day (BID) | ORAL | 0 refills | Status: DC

## 2021-05-16 MED ORDER — OXYCODONE HCL 5 MG PO TABS: 5.0000 mg | ORAL_TABLET | ORAL | 0 refills | Status: DC | PRN

## 2021-05-16 MED ORDER — ENOXAPARIN SODIUM 40 MG/0.4ML IJ SOSY: 40.0000 mg | PREFILLED_SYRINGE | INTRAMUSCULAR | 0 refills | Status: DC

## 2021-05-16 MED ORDER — METHOCARBAMOL 500 MG PO TABS: 500.0000 mg | ORAL_TABLET | Freq: Four times a day (QID) | ORAL | 0 refills | Status: DC | PRN

## 2021-05-16 NOTE — Discharge Summary (Signed)
Physician Discharge Summary  Patient ID: Daniel [name] MRN: [id number] DOB/AGE: Apr 27, 1971 50 y.o.  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 male who was admitted 05/14/2021 with a diagnosis of painful total knee, loosening of tibial and femoral components and went to the operating room on 05/14/2021 and underwent the above named procedures.    Surgeries: Procedure(s): REVISION TOTAL KNEE ARTHROPLASTY - [place] Neighbor to Assist on 05/14/2021 Patient tolerated the surgery well. Taken to PACU where she was stabilized and then transferred to the orthopedic floor.  Started on Lovenox 30 mg q 24 hrs. Foot pumps applied bilaterally at 80 mm. Heels elevated on bed with rolled towels. No evidence of DVT. Negative Homan. Physical therapy started on day #1 for gait training and transfer. OT started day #1 for ADL and assisted devices.  Patient's foley was d/c on day #1. Patient's IV was d/c on day #2.  On post op day #2 patient was stable and ready for discharge to home with home health PT.  Implants: Medacta GM K sphere for right femur, 11 mm tibial insert size 4, 4 right tibial baseplate with 105 mm stem 5 mm medial and lateral augments  and extra small 25 mm cone    He was given perioperative antibiotics:  Anti-infectives (From admission, onward)    Start     Dose/Rate Route Frequency Ordered Stop   05/14/21 1600  ceFAZolin (ANCEF) IVPB 2g/100 mL premix        2 g 200 mL/hr over 30 Minutes Intravenous Every 6 hours 05/14/21 1423 05/14/21 2040   05/14/21 0825  ceFAZolin (ANCEF) 2-4 GM/100ML-% IVPB       Note to Pharmacy: Christene Slates   : cabinet override      05/14/21 0825 05/14/21 0943   05/14/21 0600  ceFAZolin (ANCEF) IVPB 2g/100 mL premix        2 g 200 mL/hr over 30 Minutes Intravenous On call to O.R. 05/13/21 2327 05/14/21 0941     .  He was given sequential compression devices, early ambulation, and teds, Lovenox for DVT prophylaxis.  He benefited maximally from the hospital stay and there were no complications.    Recent vital signs:  Vitals:   05/16/21 0416 05/16/21 0734  BP: 114/75 132/74  Pulse: 100 95  Resp: 18 18  Temp: 99.2 F (37.3 C) 98.8 F (37.1 C)  SpO2: 95% 97%    Recent laboratory studies:  Lab Results  Component Value Date   HGB 12.4 (L) 05/15/2021   HGB 13.1 05/14/2021   HGB 13.8 05/07/2021   Lab Results  Component Value  Date   WBC 8.1 05/15/2021   PLT 161 05/15/2021   Lab Results  Component Value Date   INR 1.0 12/06/2019   Lab Results  Component Value Date   NA 133 (L) 05/15/2021   K 3.9 05/15/2021   CL 100 05/15/2021   CO2 28 05/15/2021   BUN 16 05/15/2021   CREATININE 0.86 05/15/2021   GLUCOSE 181 (H) 05/15/2021    Discharge Medications:   Allergies as of 05/16/2021       Reactions   Doxycycline Nausea And Vomiting   Other reaction(s): Vomiting   Ketorolac Itching   Other reaction(s): Unknown   Other Other (See Comments)   Confirmed by allergy testing Mercaptobenzothiazole Other reaction(s): Unknown Other reaction(s): Other (See Comments) Mercaptobenzothiazole Mercaptobenzothiazole   Parabens Other (See Comments)   Confirmed by allergy  testing Other reaction(s): Unknown Other reaction(s): Other (See Comments) Other reaction(s): Unknown   Tramadol Itching, Other (See Comments)   Tolerates extended release Other reaction(s): Other (See Comments) Other reaction(s): Other (see comments), Other (See Comments) Tolerates extended release Tolerates extended release Tolerates extended release Tolerates extended release Tolerates extended release Tolerates extended release   Penicillins Rash, Other (See Comments), Hives   Has patient had a PCN reaction causing immediate rash, facial/tongue/throat swelling, SOB or lightheadedness with hypotension: Yes Has patient had a PCN reaction causing severe rash involving mucus membranes or skin necrosis: No Has patient had a PCN reaction that required hospitalization No Has patient had a PCN reaction occurring within the last 10 years: No If all of the above answers are "NO", then may proceed with Cephalosporin use. Other reaction(s): Other Other reaction(s): Other (See Comments) Has patient had a PCN reaction causing immediate rash, facial/tongue/throat swelling, SOB or lightheadedness with hypotension: Yes Has patient had a PCN reaction causing severe rash involving mucus membranes or skin necrosis: No Has patient had a PCN reaction that required hospitalization No Has patient had a PCN reaction occurring within the last 10 years: No If all of the above answers are "NO", then may proceed with Cephalosporin use. Can take keflexCan take keflex Has patient had a PCN reaction causing immediate rash, facial/tongue/throat swelling, SOB or lightheadedness with hypotension: Yes Has patient had a PCN reaction causing severe rash involving mucus membranes or skin necrosis: No Has patient had a PCN reaction that required hospitalization No Has patient had a PCN reaction occurring within the last 10 years: No If all of the above answers are "NO", then may proceed with Cephalosporin use.         Medication List     STOP taking these medications    meloxicam 15 MG tablet Commonly known as: MOBIC       TAKE these medications    albuterol 108 (90 Base) MCG/ACT inhaler Commonly known as: VENTOLIN HFA Inhale 1 puff into the lungs every 4 (four) hours as needed for wheezing or shortness of breath.   albuterol (2.5 MG/3ML) 0.083% nebulizer solution Commonly known as: PROVENTIL Take 2.5 mg by nebulization every 4 (four) hours as needed for wheezing.   alfuzosin 10 MG 24 hr tablet Commonly known as: UROXATRAL Take 10 mg by mouth daily with breakfast.   atorvastatin 20 MG tablet Commonly known as: LIPITOR Take 20 mg by mouth at bedtime.   divalproex 250 MG 24 hr tablet Commonly known as: DEPAKOTE ER Take 250 mg by mouth in the morning and at bedtime.   docusate sodium 100 MG capsule Commonly known as: COLACE Take 1 capsule (  100 mg total) by mouth 2 (two) times daily.   DULoxetine 20 MG capsule Commonly known as: CYMBALTA Take 20 mg by mouth daily.   Emgality 120 MG/ML Soaj Generic drug: Galcanezumab-gnlm Inject 120 mg into the skin every 30 (thirty) days.   enoxaparin 40 MG/0.4ML injection Commonly known as: LOVENOX Inject 0.4 mLs (40 mg total) into the skin daily for 14 days.   FLUoxetine 20 MG capsule Commonly known as: PROZAC Take 20 mg by mouth daily.   gabapentin 300 MG capsule Commonly known as: NEURONTIN Take 600 mg by mouth 3 (three) times daily.   Jardiance 10 MG Tabs tablet Generic drug: empagliflozin Take 10 mg by mouth daily.   melatonin 3 MG Tabs tablet Take 3 mg by mouth at bedtime.   methocarbamol 500 MG tablet Commonly known as: ROBAXIN Take 1 tablet (500 mg total) by mouth every 6 (six) hours as needed for muscle spasms.   montelukast 10 MG tablet Commonly known as: SINGULAIR Take 10 mg by mouth at bedtime.   multivitamin with minerals Tabs tablet Take 1 tablet by mouth daily.   nortriptyline 10 MG capsule Commonly known  as: PAMELOR Take 20 mg by mouth at bedtime.   oxyCODONE 5 MG immediate release tablet Commonly known as: Oxy IR/ROXICODONE Take 1-2 tablets (5-10 mg total) by mouth every 4 (four) hours as needed for moderate pain (pain score 4-6).   pantoprazole 40 MG tablet Commonly known as: PROTONIX Take 40 mg by mouth 2 (two) times daily.   propranolol 10 MG tablet Commonly known as: INDERAL Take 10 mg by mouth 2 (two) times daily.   testosterone cypionate 200 MG/ML injection Commonly known as: DEPOTESTOSTERONE CYPIONATE Inject 100 mg into the muscle once a week.   traZODone 50 MG tablet Commonly known as: DESYREL Take 50 mg by mouth at bedtime.        Diagnostic Studies: DG Knee 1-2 Views Right  Result Date: 05/14/2021 CLINICAL DATA:  Postoperative pain. EXAM: RIGHT KNEE - 1-2 VIEW COMPARISON:  December 06, 2019. FINDINGS: Status post revision of right total knee arthroplasty. Right femoral and tibial components appear to be well situated. Expected postoperative changes are noted anteriorly. Stimulant beads are noted. IMPRESSION: Status post revision of right total knee arthroplasty. Electronically Signed   By: Lupita Raider M.D.   On: 05/14/2021 12:55    Disposition: Discharge disposition: 06-Home-Health Care Svc          Follow-up Information     Evon Slack, PA-C Follow up in 2 week(s).   Specialties: Orthopedic Surgery, Emergency Medicine Contact information: [address] [place] [address] [phone]                  Signed: Patience Musca 05/16/2021, 8:25 AM

## 2021-05-16 NOTE — Progress Notes (Signed)
Physical Therapy Treatment Patient Details Name: Daniel [name] MRN: [id number] DOB: 12-29-1970 Today's Date: 05/16/2021    History of Present Illness Pt admitted for R TKR revision secondary to loosening components. Initial TKR 12/06/19. History includes bipolar, chest pain, DDD, DM, GERD, and HLD.    PT Comments    Pt is making good progress towards goals with ability to improve gait pattern with improved heel strike and reciprocal gait. Still struggles with ROM, needing guidance to reach 90 degrees, pain limiting. Safety technique with HEP and stair training. Chat sent to RN for discharge.   Follow Up Recommendations  Home health PT     Equipment Recommendations  None recommended by PT    Recommendations for Other Services       Precautions / Restrictions Precautions Precautions: Fall;Knee Precaution Booklet Issued: Yes (comment) Restrictions Weight Bearing Restrictions: Yes RLE Weight Bearing: Weight bearing as tolerated    Mobility  Bed Mobility               General bed mobility comments: not performed, received in chair    Transfers Overall transfer level: Needs assistance Equipment used: Rolling walker (2 wheeled) Transfers: Sit to/from Stand Sit to Stand: Supervision         General transfer comment: safe technique, no cues required  Ambulation/Gait Ambulation/Gait assistance: Supervision Gait Distance (Feet): 300 Feet Assistive device: Rolling walker (2 wheeled) Gait Pattern/deviations: Step-through pattern     General Gait Details: improved reciprocal gait pattern with decreased cues required. Improved heel strike noted   Stairs Stairs: Yes Stairs assistance: Min guard Stair Management: One rail Left;Step to pattern;Forwards Number of Stairs: 4 General stair comments: up/down with cues for technique. Able to use L rail only   Wheelchair Mobility    Modified Rankin (Stroke Patients Only)       Balance Overall balance assessment:  Needs assistance Sitting-balance support: No upper extremity supported;Feet supported Sitting balance-Leahy Scale: Good     Standing balance support: Bilateral upper extremity supported Standing balance-Leahy Scale: Good                              Cognition Arousal/Alertness: Awake/alert Behavior During Therapy: WFL for tasks assessed/performed Overall Cognitive Status: Within Functional Limits for tasks assessed                                        Exercises Total Joint Exercises Goniometric ROM: R knee AAROM: 0-90 degrees Other Exercises Other Exercises: seated ther-ex performed including R LE SAQ, AP, QS, hip abd/add, and seated knee flexion stretches. 15 reps performed with supervision Other Exercises: ambulation to bathroom with +BM. Supervision given for transfers and safety    General Comments        Pertinent Vitals/Pain Pain Assessment: 0-10 Pain Score: 6  Pain Location: R knee Pain Descriptors / Indicators: Operative site guarding Pain Intervention(s): Limited activity within patient's tolerance;Ice applied;Repositioned    Home Living                      Prior Function            PT Goals (current goals can now be found in the care plan section) Acute Rehab PT Goals Patient Stated Goal: to go home PT Goal Formulation: With patient Time For Goal Achievement: 05/28/21 Potential to Achieve Goals:  Good Progress towards PT goals: Progressing toward goals    Frequency    BID      PT Plan Current plan remains appropriate    Co-evaluation              AM-PAC PT "6 Clicks" Mobility   Outcome Measure  Help needed turning from your back to your side while in a flat bed without using bedrails?: None Help needed moving from lying on your back to sitting on the side of a flat bed without using bedrails?: None Help needed moving to and from a bed to a chair (including a wheelchair)?: None Help needed standing  up from a chair using your arms (e.g., wheelchair or bedside chair)?: None Help needed to walk in hospital room?: None Help needed climbing 3-5 steps with a railing? : A Little 6 Click Score: 23    End of Session Equipment Utilized During Treatment: Gait belt Activity Tolerance: Patient limited by pain Patient left: in chair Nurse Communication: Mobility status PT Visit Diagnosis: Difficulty in walking, not elsewhere classified (R26.2);Muscle weakness (generalized) (M62.81);Pain Pain - Right/Left: Right Pain - part of body: Knee     Time: 7106-2694 PT Time Calculation (min) (ACUTE ONLY): 40 min  Charges:  $Gait Training: 8-22 mins $Therapeutic Exercise: 8-22 mins $Therapeutic Activity: 8-22 mins                     Daniel [name], PT, DPT [phone]    Daniel [name] 05/16/2021, 10:51 AM

## 2021-05-16 NOTE — Progress Notes (Signed)
DISCHARGE NOTE:  Pt given discharge instructions and scripts. Pt verbalized understanding. Pts Provena in place. Extra honey comb sent with pt. Belongings and polar care sent home with pt. Family providing transportation. Pt wheeled to car by staff.

## 2021-05-16 NOTE — Progress Notes (Signed)
   Subjective: 2 Days Post-Op Procedure(s) (LRB): REVISION TOTAL KNEE ARTHROPLASTY - Daniel [name] to Assist (Right) Patient reports pain as mild.   Patient is well, and has had no acute complaints or problems Denies any CP, SOB, ABD pain. We will continue therapy today.  Plan is to go Home after hospital stay.  Objective: Vital signs in last 24 hours: Temp:  [98.7 F (37.1 C)-99.2 F (37.3 C)] 98.8 F (37.1 C) (06/09 0734) Pulse Rate:  [89-101] 95 (06/09 0734) Resp:  [16-18] 18 (06/09 0734) BP: (107-132)/(69-75) 132/74 (06/09 0734) SpO2:  [90 %-97 %] 97 % (06/09 0734)  Intake/Output from previous day: 06/08 0701 - 06/09 0700 In: 720 [P.O.:720] Out: 1000 [Urine:1000] Intake/Output this shift: No intake/output data recorded.  Recent Labs    05/14/21 1441 05/15/21 0614  HGB 13.1 12.4*   Recent Labs    05/14/21 1441 05/15/21 0614  WBC 7.6 8.1  RBC 4.28 4.06*  HCT 39.2 36.4*  PLT 169 161   Recent Labs    05/14/21 1441 05/15/21 0614  NA  --  133*  K  --  3.9  CL  --  100  CO2  --  28  BUN  --  16  CREATININE 0.92 0.86  GLUCOSE  --  181*  CALCIUM  --  8.9   No results for input(s): LABPT, INR in the last 72 hours.  EXAM General - Patient is Alert, Appropriate and Oriented Extremity - Neurovascular intact Sensation intact distally Intact pulses distally Dorsiflexion/Plantar flexion intact No cellulitis present Compartment soft Dressing - dressing C/D/I and no drainage, Praveena intact without drainage Motor Function - intact, moving foot and toes well on exam.   Past Medical History:  Diagnosis Date   Asthma    Bipolar 1 disorder (HCC)    Chronic back pain    DDD (degenerative disc disease), cervical    Depression    Diabetes mellitus without complication (HCC)    ED (erectile dysfunction)    Elevated lipids    GERD (gastroesophageal reflux disease)    Headache    Insomnia    Migraine    Seasonal allergies    Wheezing     Assessment/Plan:    2 Days Post-Op Procedure(s) (LRB): REVISION TOTAL KNEE ARTHROPLASTY - Daniel [name] to Assist (Right) Active Problems:   S/P revision of total knee, right  Estimated body mass index is 27.62 kg/m as calculated from the following:   Height as of this encounter: 5\' 9"  (1.753 m).   Weight as of this encounter: 84.8 kg. Advance diet Up with therapy  Labs and vital signs stable Pain well controlled Work on bowel movement Care manager to assist with discharge to home with home health PT today  DVT Prophylaxis - Lovenox, Foot Pumps and TED hose Weight-Bearing as tolerated to right leg   T. , PA-C [facility] 05/16/2021, 8:21 AM

## 2021-05-16 NOTE — Discharge Instructions (Signed)

## 2021-08-23 IMAGING — CT CT KNEE*R* W/O CM
4 of 8 series · 12 of 33 positions shown, 13 images · non-contrast
Comparison: Plain films of the right knee 06/13/2013.

CLINICAL DATA: Chronic right knee pain. Preoperative planning
study. Patient for knee replacement.

EXAM:
CT OF THE RIGHT KNEE WITHOUT CONTRAST
TECHNIQUE: Multidetector CT imaging of the right knee was performed according
to the standard protocol. Multiplanar CT image reconstructions were
also generated. Axial imaging only of the right hip and ankle was
also performed.

[Series 3: axial bone knee 2.00 · axial · 0.32mm/px · z∈[+938,+1090]mm · 3 of 154 slices shown, 4 images]
[im 39/154  soft-tissue]
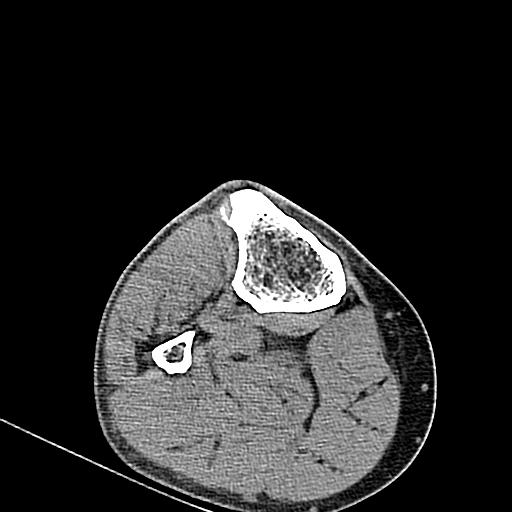
[im 39/154  bone]
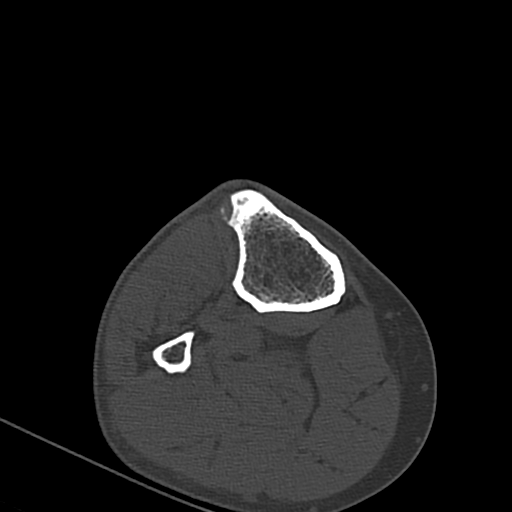
[im 77/154  bone]
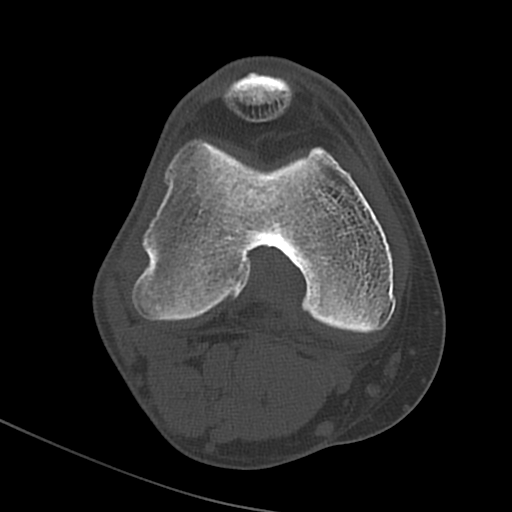
[im 115/154  bone]
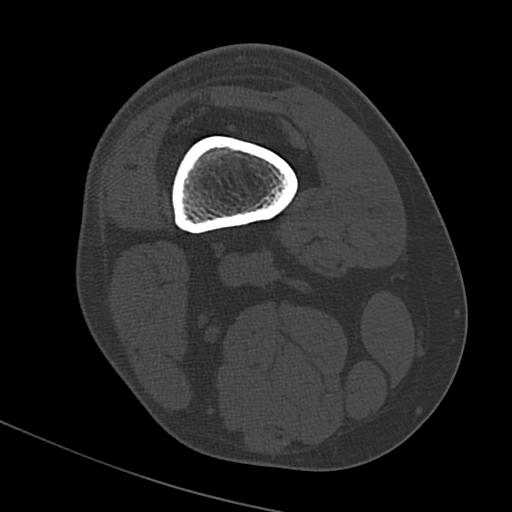

[Series 4: axial st knee 2.00 · axial · 0.32mm/px · z∈[+938,+1090]mm · 3 of 154 slices shown]
[im 39/154  bone]
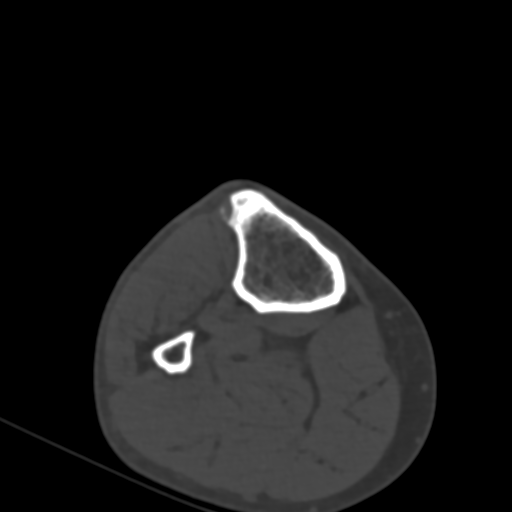
[im 77/154  bone]
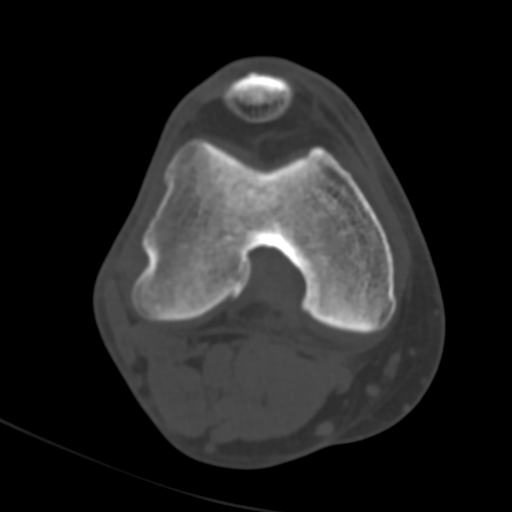
[im 115/154  bone]
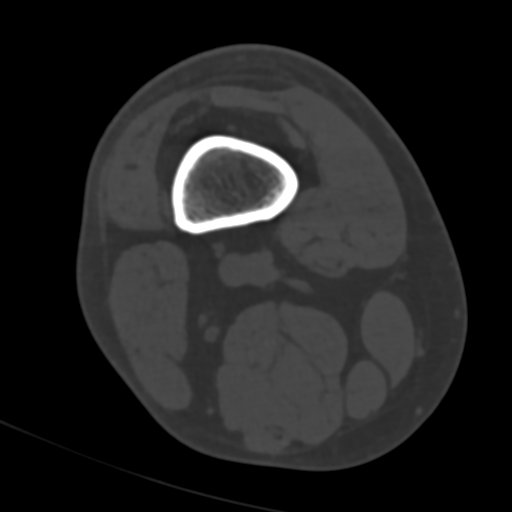

[Series 5: coronal bone knee 2.00 cor · coronal · 0.32mm/px · 1 of 82 slices shown]
[im 41/82  bone]
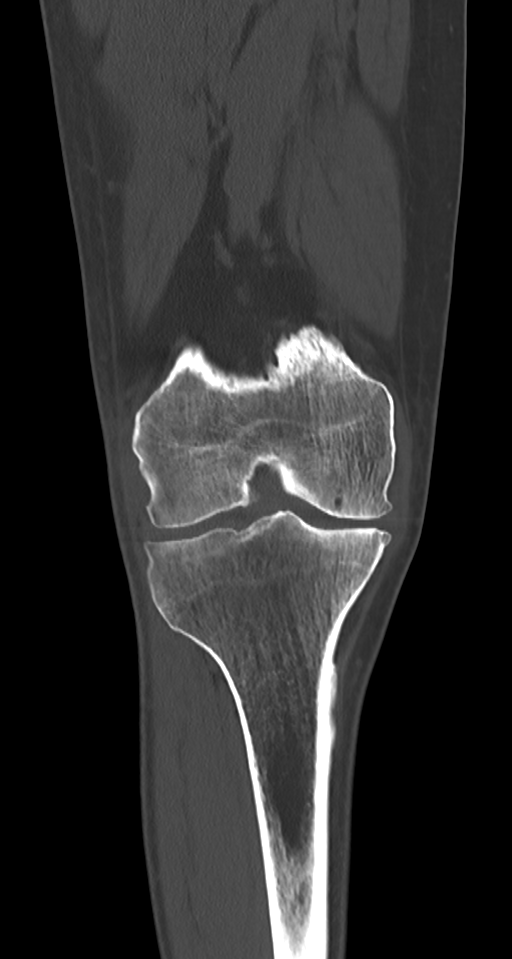

[Series 9: sagittal bone knee 2.00 sag · sagittal · 0.32mm/px · 5 of 82 slices shown]
[im 14/82  bone]
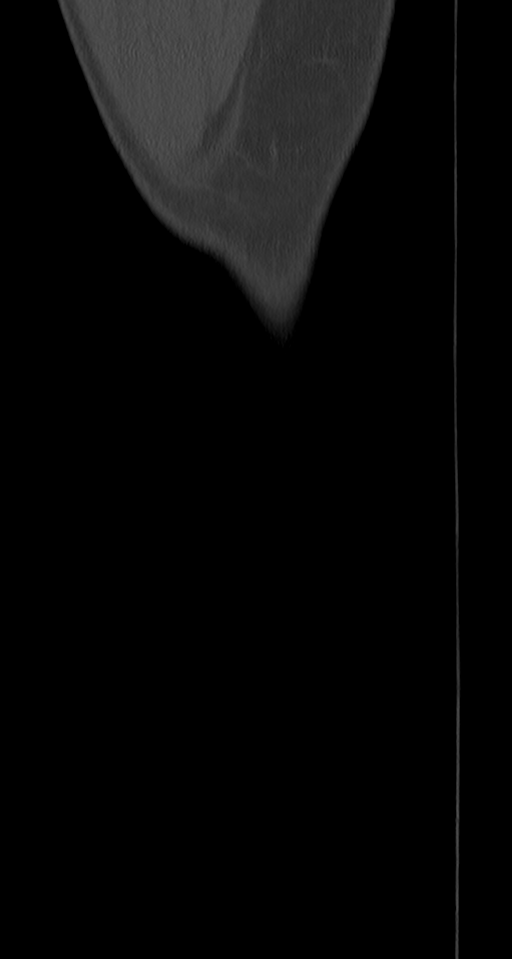
[im 28/82  bone]
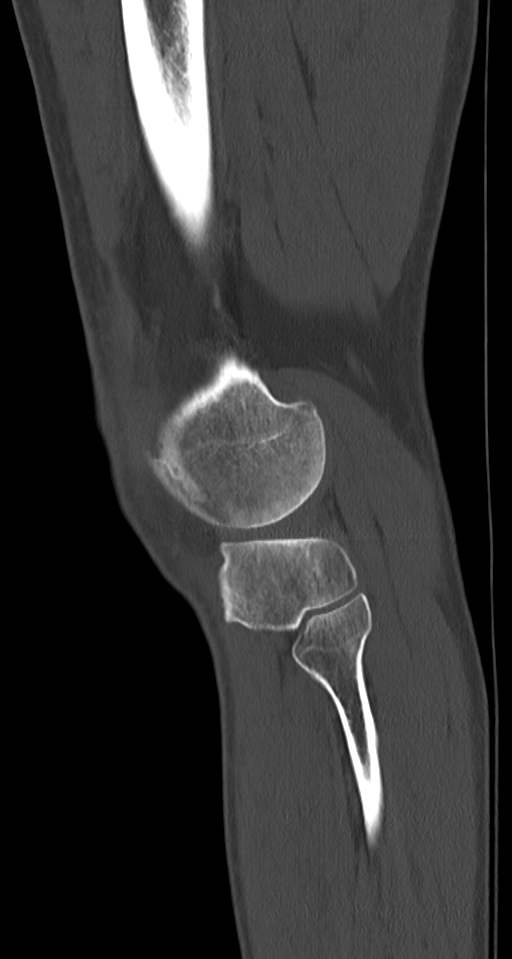
[im 41/82  bone]
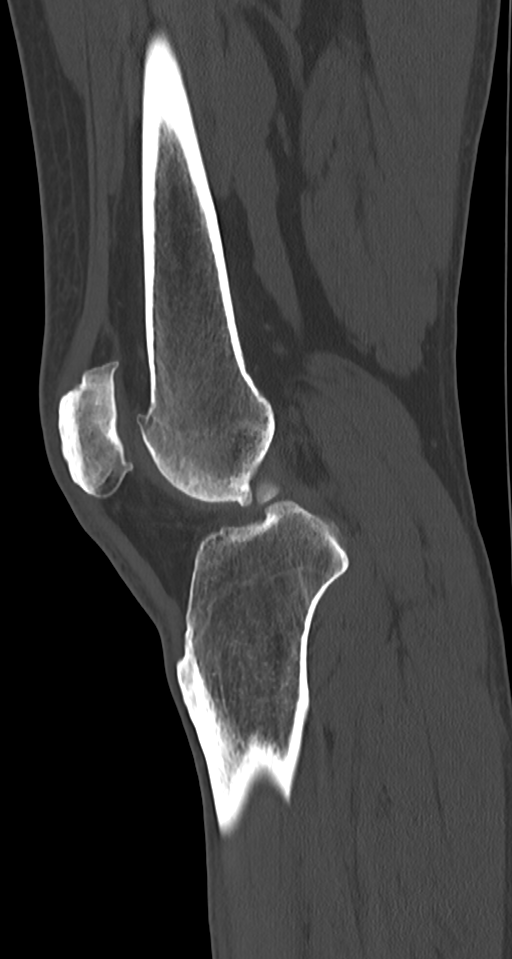
[im 55/82  bone]
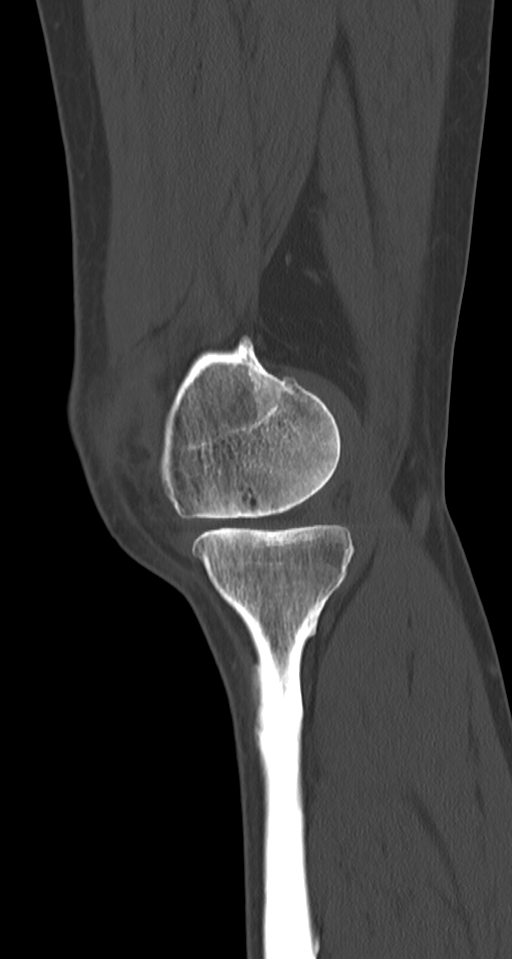
[im 68/82  bone]
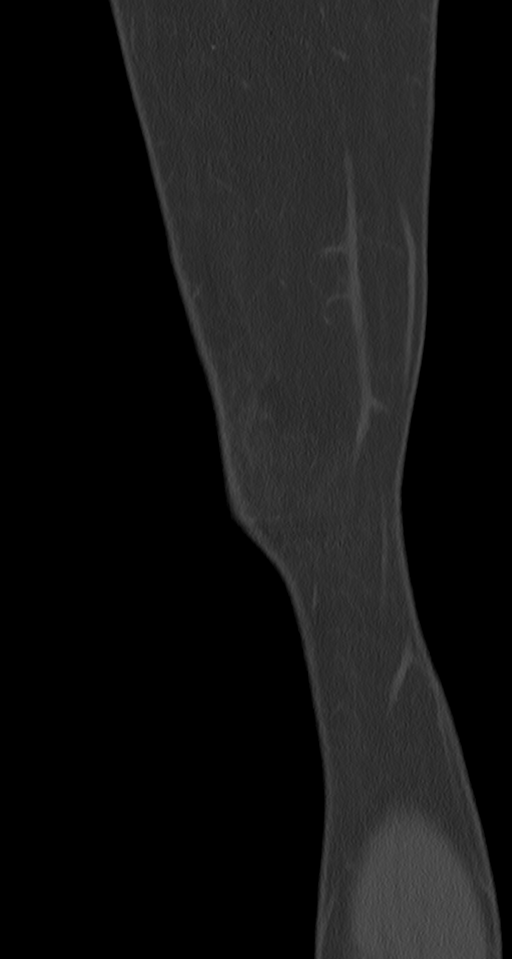

[12 of 33 positions shown; findings below may reference images not displayed]

FINDINGS: Bones/Joint/Cartilage

The patient has an osteochondral lesion of the lateral talar dome
which measures approximately 0.8 cm AP x 0.7 cm transverse. A loose
bone fragment measuring 0.6 cm in diameter is identified in
association with the lesion. There is some spurring off the
calcaneus at the Achilles tendon insertion. The right hip is
unremarkable.

No acute bony or joint abnormality is seen about the knee.
Osteophytosis is seen about the knee. There is some subchondral
sclerosis about the medial compartment.

Ligaments

Suboptimally assessed by CT. Major ligamentous structures appear
intact.

Muscles and Tendons

Intact and normal in appearance.

Soft tissues

There is a small joint effusion.
IMPRESSION: Osteoarthritis right knee.

Osteochondral lesion lateral talar dome with a loose bone fragment
identified. This finding is incompletely imaged on this exam.

## 2021-08-26 ENCOUNTER — Ambulatory Visit: Payer: Medicare Other

## 2021-08-27 ENCOUNTER — Ambulatory Visit
Admission: RE | Admit: 2021-08-27 | Discharge: 2021-08-27 | Disposition: A | Discharge status: Home / Self Care | Payer: Medicare Other | Source: Ambulatory Visit | Attending: Emergency Medicine | Admitting: Emergency Medicine

## 2021-08-27 ENCOUNTER — Other Ambulatory Visit: Payer: Self-pay

## 2021-08-27 VITALS — BP 106/76 | HR 90 | Temp 98.6°F | Resp 18

## 2021-08-27 DIAGNOSIS — M79661 Pain in right lower leg: Secondary | ICD-10-CM | POA: Insufficient documentation

## 2021-08-27 MED ORDER — TIZANIDINE HCL 4 MG PO TABS: 4.0000 mg | ORAL_TABLET | Freq: Three times a day (TID) | ORAL | 0 refills | Status: DC | PRN

## 2021-08-27 NOTE — Discharge Instructions (Addendum)
I have ordered an outpatient ultrasound to rule out a blood clot in your leg at 430 today.  Be here at 415 to check-in.  Go through the main front doors.  Stay here until I get the results of your ultrasound.  If it is positive for DVT, I will start you on Xarelto, which is a blood thinner.  You will need to follow-up with the orthopedic surgeon or with your primary care provider for ongoing management of the Xarelto.  If it is negative for DVT, we will treat this like a musculoskeletal problem.  Continue the meloxicam.  May take 1000 mg of Tylenol 3-4 times a day as needed for pain.  Elevate, ice or heat, whichever feels better.  Zanaflex for muscle spasms.  Follow-up with your doctor or with your orthopedic surgeon as needed.

## 2021-08-27 NOTE — ED Triage Notes (Signed)
Pt c/o of pain/swelling to right lower calf area x 1 week. Denies injury.

## 2021-08-27 NOTE — ED Provider Notes (Addendum)
HPI  SUBJECTIVE:  Daniel [name] is a 50 y.o. male who presents with 6 days of constant, daily, sharp right calf pain, swelling.  No change in his physical activity, trauma to the posterior knee or calf, erythema, popliteal swelling, distal numbness or tingling, hemoptysis, chest pain, shortness of breath, fevers, body aches.  He has never had symptoms like this before.  He is on his feet all day working in a [employer].  He tried rest, elevation, compression sleeve.  Elevation helps, standing and compression sleeve make the symptoms worse.  He is status post right total knee replacement almost 11 weeks ago on 6/7.  He has a history of diabetes, hypercholesterolemia, bipolar disease.  No history of DVT, PE, hypercoagulability, cancer.  PMD: [facility].  Past Medical History:  Diagnosis Date   Asthma    Bipolar 1 disorder (HCC)    Chronic back pain    DDD (degenerative disc disease), cervical    Depression    Diabetes mellitus without complication (HCC)    ED (erectile dysfunction)    Elevated lipids    GERD (gastroesophageal reflux disease)    Headache    Insomnia    Migraine    Seasonal allergies    Wheezing     Past Surgical History:  Procedure Laterality Date   CHOLECYSTECTOMY     COLONOSCOPY W/ BIOPSIES     FRACTURE SURGERY     tail bone   knee arthroscpy Right    SHOULDER ARTHROSCOPY WITH DISTAL CLAVICLE RESECTION Right 10/13/2017   Procedure: SHOULDER ARTHROSCOPY WITH DISTAL CLAVICLE RESECTION;  Surgeon: Christena Flake, MD;  Location: [facility];  Service: Orthopedics;  Laterality: Right;   SHOULDER ARTHROSCOPY WITH OPEN ROTATOR CUFF REPAIR Right 10/13/2017   Procedure: SHOULDER ARTHROSCOPY WITH OPEN ROTATOR CUFF REPAIR/;  Surgeon: Christena Flake, MD;  Location: [facility];  Service: Orthopedics;  Laterality: Right;   TOTAL KNEE ARTHROPLASTY Right 12/06/2019   Procedure: TOTAL KNEE ARTHROPLASTY;  Surgeon: Kennedy Bucker, MD;  Location: [facility];  Service:  Orthopedics;  Laterality: Right;   TOTAL KNEE ARTHROPLASTY Right 05/14/2021   Procedure: REVISION TOTAL KNEE ARTHROPLASTY - [place] Neighbor to Assist;  Surgeon: Kennedy Bucker, MD;  Location: [facility];  Service: Orthopedics;  Laterality: Right;    Family History  Problem Relation Age of Onset   Diabetes Mother    Hypertension Mother    Stroke Father     Social History   Tobacco Use   Smoking status: Former    Types: Cigarettes    Quit date: 11/28/2014    Years since quitting: 6.7   Smokeless tobacco: Never  Vaping Use   Vaping Use: Never used  Substance Use Topics   Alcohol use: No    Alcohol/week: 0.0 standard drinks   Drug use: No    No current facility-administered medications for this encounter.  Current Outpatient Medications:    tiZANidine (ZANAFLEX) 4 MG tablet, Take 1 tablet (4 mg total) by mouth every 8 (eight) hours as needed for muscle spasms., Disp: 30 tablet, Rfl: 0   albuterol (PROVENTIL HFA;VENTOLIN HFA) 108 (90 Base) MCG/ACT inhaler, Inhale 1 puff into the lungs every 4 (four) hours as needed for wheezing or shortness of breath. , Disp: , Rfl:    albuterol (PROVENTIL) (2.5 MG/3ML) 0.083% nebulizer solution, Take 2.5 mg by nebulization every 4 (four) hours as needed for wheezing. , Disp: , Rfl:    alfuzosin (UROXATRAL) 10 MG 24 hr tablet, Take 10  mg by mouth daily with breakfast., Disp: , Rfl:    atorvastatin (LIPITOR) 20 MG tablet, Take 20 mg by mouth at bedtime. , Disp: , Rfl:    divalproex (DEPAKOTE ER) 250 MG 24 hr tablet, Take 250 mg by mouth in the morning and at bedtime., Disp: , Rfl:    DULoxetine (CYMBALTA) 20 MG capsule, Take 20 mg by mouth daily., Disp: , Rfl:    empagliflozin (JARDIANCE) 10 MG TABS tablet, Take 10 mg by mouth daily., Disp: , Rfl:    enoxaparin (LOVENOX) 40 MG/0.4ML injection, Inject 0.4 mLs (40 mg total) into the skin daily for 14 days., Disp: 5.6 mL, Rfl: 0   FLUoxetine (PROZAC) 20 MG capsule, Take 20 mg by mouth daily., Disp: , Rfl:     gabapentin (NEURONTIN) 300 MG capsule, Take 600 mg by mouth 3 (three) times daily. , Disp: , Rfl:    Galcanezumab-gnlm (EMGALITY) 120 MG/ML SOAJ, Inject 120 mg into the skin every 30 (thirty) days., Disp: , Rfl:    Melatonin 3 MG TABS, Take 3 mg by mouth at bedtime. , Disp: , Rfl:    montelukast (SINGULAIR) 10 MG tablet, Take 10 mg by mouth at bedtime., Disp: , Rfl:    Multiple Vitamin (MULTIVITAMIN WITH MINERALS) TABS tablet, Take 1 tablet by mouth daily., Disp: , Rfl:    nortriptyline (PAMELOR) 10 MG capsule, Take 20 mg by mouth at bedtime., Disp: , Rfl:    pantoprazole (PROTONIX) 40 MG tablet, Take 40 mg by mouth 2 (two) times daily., Disp: , Rfl:    propranolol (INDERAL) 10 MG tablet, Take 10 mg by mouth 2 (two) times daily., Disp: , Rfl:    testosterone cypionate (DEPOTESTOSTERONE CYPIONATE) 200 MG/ML injection, Inject 100 mg into the muscle once a week., Disp: , Rfl:    traZODone (DESYREL) 50 MG tablet, Take 50 mg by mouth at bedtime., Disp: , Rfl:   Allergies  Allergen Reactions   Doxycycline Nausea And Vomiting    Other reaction(s): Vomiting   Ketorolac Itching    Other reaction(s): Unknown   Other Other (See Comments)    Confirmed by allergy testing Mercaptobenzothiazole Other reaction(s): Unknown Other reaction(s): Other (See Comments) Mercaptobenzothiazole  Mercaptobenzothiazole   Parabens Other (See Comments)    Confirmed by allergy testing Other reaction(s): Unknown Other reaction(s): Other (See Comments) Other reaction(s): Unknown   Tramadol Itching and Other (See Comments)    Tolerates extended release Other reaction(s): Other (See Comments) Other reaction(s): Other (see comments), Other (See Comments) Tolerates extended release Tolerates extended release Tolerates extended release Tolerates extended release Tolerates extended release Tolerates extended release   Penicillins Rash, Other (See Comments) and Hives    Has patient had a PCN reaction causing  immediate rash, facial/tongue/throat swelling, SOB or lightheadedness with hypotension: Yes Has patient had a PCN reaction causing severe rash involving mucus membranes or skin necrosis: No Has patient had a PCN reaction that required hospitalization No Has patient had a PCN reaction occurring within the last 10 years: No If all of the above answers are "NO", then may proceed with Cephalosporin use. Other reaction(s): Other Other reaction(s): Other (See Comments) Has patient had a PCN reaction causing immediate rash, facial/tongue/throat swelling, SOB or lightheadedness with hypotension: Yes Has patient had a PCN reaction causing severe rash involving mucus membranes or skin necrosis: No Has patient had a PCN reaction that required hospitalization No Has patient had a PCN reaction occurring within the last 10 years: No If all of the above  answers are "NO", then may proceed with Cephalosporin use. Can take keflexCan take keflex Has patient had a PCN reaction causing immediate rash, facial/tongue/throat swelling, SOB or lightheadedness with hypotension: Yes Has patient had a PCN reaction causing severe rash involving mucus membranes or skin necrosis: No Has patient had a PCN reaction that required hospitalization No Has patient had a PCN reaction occurring within the last 10 years: No If all of the above answers are "NO", then may proceed with Cephalosporin use.     ROS  As noted in HPI.   Physical Exam  BP 106/76 (BP Location: Left Arm)   Pulse 90   Temp 98.6 F (37 C) (Oral)   Resp 18   SpO2 99%   Constitutional: Well developed, well nourished, no acute distress Eyes:  EOMI, conjunctiva normal bilaterally HENT: Normocephalic, atraumatic,mucus membranes moist Respiratory: Normal inspiratory effort Cardiovascular: Normal rate GI: nondistended skin: No rash, skin intact Musculoskeletal: Right calf 34 cm, left calf 33.5 cm.  Trace edema right lower extremity.  No erythema.   Positive nontender fullness at the popliteal fossa.  Positive tenderness along the small saphenous vein in the middle posterior calf.  No palpable cord.  No tenderness along the great saphenous vein.  Pain with dorsiflexion/plantarflexion.  No pain with ankle inversion/eversion.  Sensation distally intact.  DP 2+. Neurologic: Alert & oriented x 3, no focal neuro deficits Psychiatric: Speech and behavior appropriate   ED Course   Medications - No data to display  Orders Placed This Encounter  Procedures   US Venous Img Lower Unilateral Right (DVT)    Standing Status:   Future    Number of Occurrences:   1    Standing Expiration Date:   08/27/2022    Order Specific Question:   Reason for Exam (SYMPTOM  OR DIAGNOSIS REQUIRED)    Answer:   Midline calf tenderness 11 weeks postop.  Rule out DVT    Order Specific Question:   Preferred imaging location?    Answer:   [facility] [place]    Order Specific Question:   Call Results- Best Contact Number?    Answer:   [phone]- pt can leave    No results found for this or any previous visit (from the past 24 hour(s)). No results found. ADDENDUM REPORT: 08/28/2021 08:17   FINDINGS: In addition to the described findings, there is an additional area of fluid in the right popliteal fossa measuring 2.4 x 1.8 x 0.6 cm compatible with a Baker's cyst.     Electronically Signed   By: Olive Bass M.D.   On: 08/28/2021 08:17    Addended by Pernell Dupre, MD on 08/28/2021  8:19 AM   Study Result  Narrative & Impression  CLINICAL DATA:  Midline calf tenderness 11 weeks postop. Rule out DVT   EXAM: RIGHT LOWER EXTREMITY VENOUS DOPPLER ULTRASOUND   TECHNIQUE: Gray-scale sonography with graded compression, as well as color Doppler and duplex ultrasound were performed to evaluate the lower extremity deep venous systems from the level of the common femoral vein and including the common femoral, femoral, profunda femoral, popliteal and calf  veins including the posterior tibial, peroneal and gastrocnemius veins when visible. The superficial great saphenous vein was also interrogated. Spectral Doppler was utilized to evaluate flow at rest and with distal augmentation maneuvers in the common femoral, femoral and popliteal veins.   COMPARISON:  None.   FINDINGS: Contralateral Common Femoral Vein: Respiratory phasicity is normal and symmetric with the symptomatic side.  No evidence of thrombus. Normal compressibility.   Common Femoral Vein: No evidence of thrombus. Normal compressibility, respiratory phasicity and response to augmentation.   Saphenofemoral Junction: No evidence of thrombus. Normal compressibility and flow on color Doppler imaging.   Profunda Femoral Vein: No evidence of thrombus. Normal compressibility and flow on color Doppler imaging.   Femoral Vein: No evidence of thrombus. Normal compressibility, respiratory phasicity and response to augmentation.   Popliteal Vein: No evidence of thrombus. Normal compressibility, respiratory phasicity and response to augmentation.   Calf Veins: No evidence of thrombus. Normal compressibility and flow on color Doppler imaging.   Other Findings: In the right medial calf, there is a elongated, thin area of fluid measuring 10 cm in length and 0.7 cm in thickness.   IMPRESSION: 1. No evidence of deep venous thrombosis in the right lower extremity. 2. In the right medial calf, there is a thin, elongated fluid collection spanning an area of 10 x 0.7 cm. Given history of recent surgery, this most likely represents a postoperative seroma.   Electronically Signed: By: Olive Bass M.D. On: 08/27/2021 16:45      ED Clinical Impression  1. Right calf pain      ED Assessment/Plan  Wells score for DVT 2.  No D-dimer available here, however, ultrasound is here today.  She will see him at 1630 today for an outpatient ultrasound.  If positive, will start him on  Xarelto, and if negative, he will continue meloxicam, will add Tylenol and Zanaflex to it.   Discussed MDM, treatment plan, and plan for follow-up with patient. patient agrees with plan.  Emphasized importance of being here and checking in at 1615 for his ultrasound.  Reviewed images, radiology report discussed with radiology tech.  No DVT.  Positive fluid collection in the popliteal fossa.   thin, long collection spanning an area of 10 x 0.7 cm in the right medial calf suggestive of postoperative seroma.  Difficult to tell whether the collection in the back of his knee/calf is a postoperative seroma or if it is a ruptured Baker's cyst.  There is no DVT. I discussed these findings with the patient and encouraged him to follow-up with orthopedics.  continue plan as above.  Discussed imaging, medical decision making, plan for follow-up with patient.  Patient agrees with plan.  Addendum 9/23 0740: radiology report addendum reviewed. No DVT. Baker's cyst with fluid in the right calf.   Meds ordered this encounter  Medications   tiZANidine (ZANAFLEX) 4 MG tablet    Sig: Take 1 tablet (4 mg total) by mouth every 8 (eight) hours as needed for muscle spasms.    Dispense:  30 tablet    Refill:  0      *This clinic note was created using Scientist, clinical (histocompatibility and immunogenetics). Therefore, there may be occasional mistakes despite careful proofreading.  ?    Domenick Gong, MD 08/27/21 1721    Domenick Gong, MD 08/30/21 [id number]

## 2021-10-02 ENCOUNTER — Other Ambulatory Visit: Payer: Self-pay

## 2021-10-02 ENCOUNTER — Ambulatory Visit
Admission: EM | Admit: 2021-10-02 | Discharge: 2021-10-02 | Disposition: A | Discharge status: Home / Self Care | Payer: Medicare Other | Attending: Emergency Medicine | Admitting: Emergency Medicine

## 2021-10-02 DIAGNOSIS — Z20822 Contact with and (suspected) exposure to covid-19: Secondary | ICD-10-CM | POA: Diagnosis present

## 2021-10-02 DIAGNOSIS — R051 Acute cough: Secondary | ICD-10-CM

## 2021-10-02 DIAGNOSIS — R0981 Nasal congestion: Secondary | ICD-10-CM | POA: Diagnosis present

## 2021-10-02 MED ORDER — FLUTICASONE PROPIONATE 50 MCG/ACT NA SUSP: 2.0000 | Freq: Every day | NASAL | 0 refills | Status: DC

## 2021-10-02 NOTE — ED Triage Notes (Signed)
Pt reports having congestion x3 days. Covid exposure this week.

## 2021-10-02 NOTE — ED Provider Notes (Signed)
[facility]    CSN: [id number] Arrival date & time: 10/02/21  1157      History   Chief Complaint Chief Complaint  Patient presents with   Nasal Congestion    HPI Daniel [name] is a 50 y.o. male.   HPI  Nasal Congestion: Pt reports that he has has nasal congestion for the past 3 days. He also reports having a COVID-19 exposure this weekend so he wanted to be tested to ensure that his symptoms are not related. He has been using nothing for symptoms. He denies SOB, wheezing, fever.   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    Past Surgical History:  Procedure Laterality Date   CHOLECYSTECTOMY     COLONOSCOPY W/ BIOPSIES     FRACTURE SURGERY     tail bone   knee arthroscpy Right    SHOULDER ARTHROSCOPY WITH DISTAL CLAVICLE RESECTION Right 10/13/2017   Procedure: SHOULDER ARTHROSCOPY WITH DISTAL CLAVICLE RESECTION;  Surgeon: Christena Flake, MD;  Location: [facility];  Service: Orthopedics;  Laterality: Right;   SHOULDER ARTHROSCOPY WITH OPEN ROTATOR CUFF REPAIR Right 10/13/2017   Procedure: SHOULDER ARTHROSCOPY WITH OPEN ROTATOR CUFF REPAIR/;  Surgeon: Christena Flake, MD;  Location: [facility];  Service: Orthopedics;  Laterality: Right;   TOTAL KNEE ARTHROPLASTY Right 12/06/2019   Procedure: TOTAL KNEE ARTHROPLASTY;  Surgeon: Kennedy Bucker, MD;  Location: [facility];  Service: Orthopedics;  Laterality: Right;   TOTAL KNEE ARTHROPLASTY Right 05/14/2021   Procedure: REVISION  TOTAL KNEE ARTHROPLASTY - [place] Neighbor to Assist;  Surgeon: Kennedy Bucker, MD;  Location: [facility];  Service: Orthopedics;  Laterality: Right;       Home Medications    Prior to Admission medications   Medication Sig Start Date End Date Taking? Authorizing Provider  albuterol (PROVENTIL HFA;VENTOLIN HFA) 108 (90 Base) MCG/ACT inhaler Inhale 1 puff into the lungs every 4 (four) hours as needed for wheezing or shortness of breath.     [provider]  albuterol (PROVENTIL) (2.5 MG/3ML) 0.083% nebulizer solution Take 2.5 mg by nebulization every 4 (four) hours as needed for wheezing.     [provider]  alfuzosin (UROXATRAL) 10 MG 24 hr tablet Take 10 mg by mouth daily with breakfast.    [provider]  atorvastatin (LIPITOR) 20 MG tablet Take 20 mg by mouth at bedtime.     [provider]  divalproex (DEPAKOTE ER) 250 MG 24 hr tablet Take 250 mg by mouth in the morning and at bedtime.    [provider]  DULoxetine (CYMBALTA) 20 MG capsule Take 20 mg by mouth daily.    [provider]  empagliflozin (JARDIANCE) 10 MG TABS tablet Take 10 mg by mouth daily.    [provider]  enoxaparin (LOVENOX) 40 MG/0.4ML injection Inject 0.4 mLs (40 mg total) into the skin daily  for 14 days. 05/16/21 05/30/21  Evon Slack, PA-C  FLUoxetine (PROZAC) 20 MG capsule Take 20 mg by mouth daily.    [provider]  gabapentin (NEURONTIN) 300 MG capsule Take 600 mg by mouth 3 (three) times daily.     [provider]  Galcanezumab-gnlm (EMGALITY) 120 MG/ML SOAJ Inject 120 mg into the skin every 30 (thirty) days.    [provider]  Melatonin 3 MG TABS Take 3 mg by mouth at bedtime.     [provider]  montelukast (SINGULAIR) 10 MG tablet Take 10 mg by mouth at bedtime.    [provider]  Multiple Vitamin (MULTIVITAMIN WITH MINERALS) TABS tablet Take 1 tablet by mouth daily.    [provider]   nortriptyline (PAMELOR) 10 MG capsule Take 20 mg by mouth at bedtime.    [provider]  pantoprazole (PROTONIX) 40 MG tablet Take 40 mg by mouth 2 (two) times daily.    [provider]  propranolol (INDERAL) 10 MG tablet Take 10 mg by mouth 2 (two) times daily.    [provider]  testosterone cypionate (DEPOTESTOSTERONE CYPIONATE) 200 MG/ML injection Inject 100 mg into the muscle once a week.    [provider]  tiZANidine (ZANAFLEX) 4 MG tablet Take 1 tablet (4 mg total) by mouth every 8 (eight) hours as needed for muscle spasms. 08/27/21   Domenick Gong, MD  traZODone (DESYREL) 50 MG tablet Take 50 mg by mouth at bedtime.    [provider]  famotidine (PEPCID) 20 MG tablet Take 1 tablet (20 mg total) by mouth 2 (two) times daily. 03/05/18 06/01/20  Merrily Brittle, MD    Family History Family History  Problem Relation Age of Onset   Diabetes Mother    Hypertension Mother    Stroke Father     Social History Social History   Tobacco Use   Smoking status: Former    Types: Cigarettes    Quit date: 11/28/2014    Years since quitting: 6.8   Smokeless tobacco: Never  Vaping Use   Vaping Use: Never used  Substance Use Topics   Alcohol use: No    Alcohol/week: 0.0 standard drinks   Drug use: No     Allergies   Doxycycline, Ketorolac, Other, Parabens, Tramadol, and Penicillins   Review of Systems Review of Systems  As stated above in HPI Physical Exam Triage Vital Signs ED Triage Vitals  Enc Vitals Group     BP 10/02/21 1222 134/89     Pulse Rate 10/02/21 1222 79     Resp 10/02/21 1222 18     Temp 10/02/21 1222 98.3 F (36.8 C)     Temp Source 10/02/21 1222 Oral     SpO2 10/02/21 1222 100 %     Weight 10/02/21 1223 187 lb (84.8 kg)     Height 10/02/21 1223 5\' 9"  (1.753 m)     Head Circumference --      Peak Flow --      Pain Score 10/02/21 1222 0     Pain Loc --      Pain Edu? --      Excl. in GC? --    No data  found.  Updated Vital Signs BP 134/89   Pulse 79   Temp 98.3 F (36.8 C) (Oral)   Resp 18   Ht 5\' 9"  (1.753 m)   Wt 187 lb (84.8 kg)   SpO2 100%   BMI 27.62  kg/m   Physical Exam Vitals and nursing note reviewed.  Constitutional:      General: He is not in acute distress.    Appearance: Normal appearance. He is not ill-appearing, toxic-appearing or diaphoretic.  HENT:     Head: Normocephalic and atraumatic.     Right Ear: Tympanic membrane normal.     Left Ear: Tympanic membrane normal.     Nose: Congestion (mild) present. No rhinorrhea.     Mouth/Throat:     Mouth: Mucous membranes are moist.     Pharynx: Oropharynx is clear. No oropharyngeal exudate or posterior oropharyngeal erythema.  Eyes:     General:        Right eye: No discharge.        Left eye: No discharge.     Extraocular Movements: Extraocular movements intact.     Conjunctiva/sclera: Conjunctivae normal.     Pupils: Pupils are equal, round, and reactive to light.  Cardiovascular:     Rate and Rhythm: Normal rate and regular rhythm.     Heart sounds: Normal heart sounds.  Pulmonary:     Effort: Pulmonary effort is normal.     Breath sounds: Normal breath sounds.  Musculoskeletal:     Cervical back: Normal range of motion and neck supple.  Lymphadenopathy:     Cervical: No cervical adenopathy.  Skin:    General: Skin is warm.  Neurological:     Mental Status: He is alert.     UC Treatments / Results  Labs (all labs ordered are listed, but only abnormal results are displayed) Labs Reviewed  SARS CORONAVIRUS 2 (TAT 6-24 HRS)    EKG   Radiology No results found.  Procedures Procedures (including critical care time)  Medications Ordered in UC Medications - No data to display  Initial Impression / Assessment and Plan / UC Course  I have reviewed the triage vital signs and the nursing notes.  Pertinent labs & imaging results that were available during my care of the patient were reviewed  by me and considered in my medical decision making (see chart for details).     New. Viral testing pending. For now sending in flonase. OTC cough medication PRN as he is allergic to ingredients in tessalon. Discussed red flag sign and symptoms. Follow up PRN.  Final Clinical Impressions(s) / UC Diagnoses   Final diagnoses:  None   Discharge Instructions   None    ED Prescriptions   None    PDMP not reviewed this encounter.   Rushie Chestnut, [place] 10/02/21 1413

## 2021-10-03 ENCOUNTER — Ambulatory Visit: Payer: Medicare Other

## 2021-10-03 LAB — SARS CORONAVIRUS 2 (TAT 6-24 HRS): SARS Coronavirus 2: POSITIVE — AB

## 2021-10-11 ENCOUNTER — Other Ambulatory Visit: Payer: Self-pay | Admitting: Family Medicine

## 2021-10-11 ENCOUNTER — Other Ambulatory Visit: Payer: Self-pay

## 2021-10-11 ENCOUNTER — Ambulatory Visit
Admission: RE | Admit: 2021-10-11 | Discharge: 2021-10-11 | Disposition: A | Discharge status: Home / Self Care | Payer: Medicare Other | Source: Ambulatory Visit | Attending: Family Medicine | Admitting: Family Medicine

## 2021-10-11 ENCOUNTER — Ambulatory Visit
Admission: RE | Admit: 2021-10-11 | Discharge: 2021-10-11 | Disposition: A | Discharge status: Home / Self Care | Payer: Medicare Other | Attending: Family Medicine | Admitting: Family Medicine

## 2021-10-11 DIAGNOSIS — R0602 Shortness of breath: Secondary | ICD-10-CM

## 2021-10-11 DIAGNOSIS — R051 Acute cough: Secondary | ICD-10-CM

## 2021-10-11 DIAGNOSIS — R0989 Other specified symptoms and signs involving the circulatory and respiratory systems: Secondary | ICD-10-CM | POA: Diagnosis present

## 2021-10-28 DIAGNOSIS — H53149 Visual discomfort, unspecified: Secondary | ICD-10-CM | POA: Insufficient documentation

## 2022-02-06 ENCOUNTER — Other Ambulatory Visit: Payer: Self-pay | Admitting: Physician Assistant

## 2022-02-06 DIAGNOSIS — R42 Dizziness and giddiness: Secondary | ICD-10-CM

## 2022-02-06 DIAGNOSIS — R519 Headache, unspecified: Secondary | ICD-10-CM

## 2022-03-24 ENCOUNTER — Other Ambulatory Visit: Payer: Self-pay | Admitting: Student

## 2022-03-24 DIAGNOSIS — Z9889 Other specified postprocedural states: Secondary | ICD-10-CM

## 2022-04-01 ENCOUNTER — Other Ambulatory Visit: Payer: Self-pay | Admitting: Student

## 2022-04-01 DIAGNOSIS — Z9889 Other specified postprocedural states: Secondary | ICD-10-CM

## 2022-04-01 DIAGNOSIS — M7581 Other shoulder lesions, right shoulder: Secondary | ICD-10-CM

## 2022-04-07 ENCOUNTER — Ambulatory Visit: Payer: Medicare Other

## 2022-04-07 ENCOUNTER — Other Ambulatory Visit: Payer: Medicare Other

## 2022-04-08 ENCOUNTER — Ambulatory Visit
Admission: RE | Admit: 2022-04-08 | Discharge: 2022-04-08 | Disposition: A | Discharge status: Home / Self Care | Payer: Medicare Other | Source: Ambulatory Visit | Attending: Physician Assistant | Admitting: Physician Assistant

## 2022-04-08 ENCOUNTER — Ambulatory Visit
Admission: RE | Admit: 2022-04-08 | Discharge: 2022-04-08 | Disposition: A | Discharge status: Home / Self Care | Payer: Medicare Other | Source: Ambulatory Visit | Attending: Student | Admitting: Student

## 2022-04-08 DIAGNOSIS — R519 Headache, unspecified: Secondary | ICD-10-CM

## 2022-04-08 DIAGNOSIS — M7581 Other shoulder lesions, right shoulder: Secondary | ICD-10-CM | POA: Diagnosis present

## 2022-04-08 DIAGNOSIS — R42 Dizziness and giddiness: Secondary | ICD-10-CM | POA: Diagnosis present

## 2022-04-08 DIAGNOSIS — Z9889 Other specified postprocedural states: Secondary | ICD-10-CM | POA: Diagnosis not present

## 2022-05-29 ENCOUNTER — Other Ambulatory Visit: Payer: Self-pay | Admitting: Student

## 2022-05-29 DIAGNOSIS — M47812 Spondylosis without myelopathy or radiculopathy, cervical region: Secondary | ICD-10-CM

## 2022-05-30 ENCOUNTER — Other Ambulatory Visit: Payer: Self-pay | Admitting: Student

## 2022-05-30 DIAGNOSIS — M47812 Spondylosis without myelopathy or radiculopathy, cervical region: Secondary | ICD-10-CM

## 2022-06-13 ENCOUNTER — Ambulatory Visit
Admission: RE | Admit: 2022-06-13 | Discharge: 2022-06-13 | Disposition: A | Discharge status: Home / Self Care | Payer: Medicare Other | Source: Ambulatory Visit | Attending: Student | Admitting: Student

## 2022-06-13 DIAGNOSIS — M47812 Spondylosis without myelopathy or radiculopathy, cervical region: Secondary | ICD-10-CM | POA: Diagnosis present

## 2022-06-27 ENCOUNTER — Telehealth: Payer: Self-pay

## 2022-06-27 NOTE — Telephone Encounter (Signed)
Please schedule with Dr. Yarbrough as a new patient.  

## 2022-06-27 NOTE — Telephone Encounter (Signed)
Left message to call back  

## 2022-06-27 NOTE — Telephone Encounter (Signed)
He confirmed 07/24/2022.

## 2022-06-27 NOTE — Telephone Encounter (Signed)
-----   Message from Autumn Messing sent at 06/26/2022 10:06 AM EDT ----- Regarding: MRI review/new patient appointment request Patient called to schedule new pt appt with Dr. Jeannie Fend; MRI received week of 7/10; no PT or ESIs. Pt has bone spur against esophagus (nodule). Please have Dr Jeannie Fend. review MRI and  advise about appt scheduling.  Patient confirmed contact # as [phone]. Please advise.  Thank you,  Victorino Dike

## 2022-07-06 ENCOUNTER — Ambulatory Visit: Payer: Medicare Other | Attending: Otolaryngology

## 2022-07-06 DIAGNOSIS — E669 Obesity, unspecified: Secondary | ICD-10-CM | POA: Diagnosis not present

## 2022-07-06 DIAGNOSIS — G4733 Obstructive sleep apnea (adult) (pediatric): Secondary | ICD-10-CM | POA: Diagnosis not present

## 2022-07-06 DIAGNOSIS — R0683 Snoring: Secondary | ICD-10-CM | POA: Insufficient documentation

## 2022-07-06 DIAGNOSIS — R400 Somnolence: Secondary | ICD-10-CM | POA: Insufficient documentation

## 2022-07-09 ENCOUNTER — Ambulatory Visit: Payer: Medicare Other | Admitting: Urology

## 2022-07-15 ENCOUNTER — Ambulatory Visit: Payer: Medicare Other

## 2022-07-21 ENCOUNTER — Encounter: Payer: Self-pay | Admitting: Urology

## 2022-07-21 ENCOUNTER — Ambulatory Visit (INDEPENDENT_AMBULATORY_CARE_PROVIDER_SITE_OTHER): Payer: Medicare Other | Admitting: Urology

## 2022-07-21 VITALS — BP 127/81 | HR 81 | Ht 69.0 in | Wt 212.6 lb

## 2022-07-21 DIAGNOSIS — E291 Testicular hypofunction: Secondary | ICD-10-CM

## 2022-07-21 DIAGNOSIS — R351 Nocturia: Secondary | ICD-10-CM | POA: Diagnosis not present

## 2022-07-21 DIAGNOSIS — Z125 Encounter for screening for malignant neoplasm of prostate: Secondary | ICD-10-CM | POA: Diagnosis not present

## 2022-07-21 DIAGNOSIS — R399 Unspecified symptoms and signs involving the genitourinary system: Secondary | ICD-10-CM

## 2022-07-21 MED ORDER — TADALAFIL 5 MG PO TABS: 5.0000 mg | ORAL_TABLET | Freq: Every day | ORAL | 11 refills | Status: DC

## 2022-07-21 NOTE — Progress Notes (Signed)
07/21/22 10:34 AM   Daniel [name] [birth year] [id number]  CC: PSA screening, history of low testosterone, ED, lower urinary tract symptoms  HPI: 51 year old male with bipolar disorder and the above urologic issues.  He was previously followed by [facility] urology, and those records were reviewed.  He has a family history of nonlethal prostate cancer in his father that was treated with surgery and radiation.  He reportedly was previously on testosterone since, but this was discontinued about a year ago secondary to elevated hematocrit.  He has not been on testosterone for the last year.  He previously was on Trimix injections as well for ED that did not work well, but he currently has been using Cialis that he gets online with good results, and taking the 5 mg dose.  He also has some mild urinary symptoms of occasional nocturia once overnight and weak stream, and has been on alfuzosin as well.  He is transferring his care here to [place].  Most recent PSA in March 2022 was 1.68  PMH: Past Medical History:  Diagnosis Date   Asthma    Bipolar 1 disorder (HCC)    Chronic back pain    DDD (degenerative disc disease), cervical    Depression    Diabetes mellitus without complication (HCC)    ED (erectile dysfunction)    Elevated lipids    GERD (gastroesophageal reflux disease)    Headache    Insomnia    Migraine    Seasonal allergies    Wheezing     Surgical History: Past Surgical History:  Procedure Laterality Date   CHOLECYSTECTOMY     COLONOSCOPY W/ BIOPSIES     FRACTURE SURGERY     tail bone   knee arthroscpy Right    SHOULDER ARTHROSCOPY WITH DISTAL CLAVICLE RESECTION Right 10/13/2017   Procedure: SHOULDER ARTHROSCOPY WITH DISTAL CLAVICLE RESECTION;  Surgeon: Christena Flake, MD;  Location: [facility];  Service: Orthopedics;  Laterality: Right;   SHOULDER ARTHROSCOPY WITH OPEN ROTATOR CUFF REPAIR Right 10/13/2017   Procedure: SHOULDER ARTHROSCOPY WITH OPEN ROTATOR CUFF REPAIR/;   Surgeon: Christena Flake, MD;  Location: [facility];  Service: Orthopedics;  Laterality: Right;   TOTAL KNEE ARTHROPLASTY Right 12/06/2019   Procedure: TOTAL KNEE ARTHROPLASTY;  Surgeon: Kennedy Bucker, MD;  Location: [facility];  Service: Orthopedics;  Laterality: Right;   TOTAL KNEE ARTHROPLASTY Right 05/14/2021   Procedure: REVISION TOTAL KNEE ARTHROPLASTY - [place] Neighbor to Assist;  Surgeon: Kennedy Bucker, MD;  Location: [facility];  Service: Orthopedics;  Laterality: Right;    Family History: Family History  Problem Relation Age of Onset   Diabetes Mother    Hypertension Mother    Stroke Father     Social History:  reports that he quit smoking about 7 years ago. His smoking use included cigarettes. He has never used smokeless tobacco. He reports that he does not drink alcohol and does not use drugs.  Physical Exam: BP 127/81 (BP Location: Left Arm, Patient Position: Sitting, Cuff Size: Normal)   Pulse 81   Ht 5\' 9"  (1.753 m)   Wt 212 lb 9.6 oz (96.4 kg)   BMI 31.40 kg/m    Constitutional:  Alert and oriented, No acute distress. Cardiovascular: No clubbing, cyanosis, or edema. Respiratory: Normal respiratory effort, no increased work of breathing. GI: Abdomen is soft, nontender, nondistended, no abdominal masses  Laboratory Data: Reviewed, see HPI   Assessment & Plan:   51 year old male with a number of urologic issues including  family history of prostate cancer, prior history of low testosterone injections complicated by elevated hematocrit,, ED currently responsive to Cialis, and mild urinary symptoms on alfuzosin.  Most recent PSA was 1.68 in March 2022.  We reviewed the AUA guidelines regarding testosterone evaluation and management.  I recommended checking total testosterone today.  Consider PA follow-up to discuss testosterone replacement options if testosterone low, discussed need for repeat blood work.  Regarding his ED, the Cialis is working well, and a new prescription was  sent to [email or web address].  With his mild urinary symptoms, I recommended discontinuing the alfuzosin and trying the Cialis daily to see if this helps with both his urinary symptoms and ED.  Could always resume alfuzosin in the future if worsening urinary symptoms.  PSA screening risks and benefits reviewed, will check PSA reflex to free today.  -Cialis 5 mg daily for ED and BPH -PSA and total testosterone today, call with results.  If testosterone low will schedule follow-up with PA to discuss replacement options  Legrand Rams, MD 07/21/2022  [facility] [address] [place], [place] [address] [phone]

## 2022-07-22 LAB — TESTOSTERONE: Testosterone: 406 ng/dL (ref 264–916)

## 2022-07-22 LAB — PSA TOTAL (REFLEX TO FREE): Prostate Specific Ag, Serum: 1.5 ng/mL (ref 0.0–4.0)

## 2022-07-23 NOTE — Progress Notes (Unsigned)
Referring Physician:  Anson Oregon, PA-C [address] [place],  [place] [address]  Primary Physician:  Leim Fabry, MD  History of Present Illness: 07/23/2022 [name] is here today with a chief complaint of neck pain that radiates into his right shoulder.  He has had numbness in his right pinky for short period of time.  His neck pain has been ongoing for 3 months.  Nothing really helps or makes it worse.  He has pain to his right trapezius muscle.  It is constant.  He has been doing home exercises without improvement.  He has tried some medications at home.  He has not had injections.  He also reports some issues with swallowing.  That has been bothering him. Bowel/Bladder Dysfunction: none  Conservative measures:  Physical therapy: has not participated in formal physical therapy, but he has been doing home exercises Multimodal medical therapy including regular antiinflammatories:  celebrex, tizanidine, meloxicam Injections:  has not received any epidural steroid injections  Past Surgery: Lumbar Surgery in February 2018 in [place] ([facility])  Daniel [name] has no symptoms of cervical myelopathy.  The symptoms are causing a significant impact on the patient's life.   Review of Systems:  A 10 point review of systems is negative, except for the pertinent positives and negatives detailed in the HPI.  Past Medical History: Past Medical History:  Diagnosis Date   Asthma    Bipolar 1 disorder (HCC)    Chronic back pain    DDD (degenerative disc disease), cervical    Depression    Diabetes mellitus without complication (HCC)    ED (erectile dysfunction)    Elevated lipids    GERD (gastroesophageal reflux disease)    Headache    Insomnia    Migraine    Seasonal allergies    Wheezing     Past Surgical History: Past Surgical History:  Procedure Laterality Date   CHOLECYSTECTOMY     COLONOSCOPY W/ BIOPSIES     FRACTURE SURGERY     tail bone    knee arthroscpy Right    SHOULDER ARTHROSCOPY WITH DISTAL CLAVICLE RESECTION Right 10/13/2017   Procedure: SHOULDER ARTHROSCOPY WITH DISTAL CLAVICLE RESECTION;  Surgeon: Christena Flake, MD;  Location: [facility];  Service: Orthopedics;  Laterality: Right;   SHOULDER ARTHROSCOPY WITH OPEN ROTATOR CUFF REPAIR Right 10/13/2017   Procedure: SHOULDER ARTHROSCOPY WITH OPEN ROTATOR CUFF REPAIR/;  Surgeon: Christena Flake, MD;  Location: [facility];  Service: Orthopedics;  Laterality: Right;   TOTAL KNEE ARTHROPLASTY Right 12/06/2019   Procedure: TOTAL KNEE ARTHROPLASTY;  Surgeon: Kennedy Bucker, MD;  Location: [facility];  Service: Orthopedics;  Laterality: Right;   TOTAL KNEE ARTHROPLASTY Right 05/14/2021   Procedure: REVISION TOTAL KNEE ARTHROPLASTY - [place] Neighbor to Assist;  Surgeon: Kennedy Bucker, MD;  Location: [facility];  Service: Orthopedics;  Laterality: Right;    Allergies: Allergies as of 07/24/2022 - Review Complete 07/21/2022  Allergen Reaction Noted   Doxycycline Nausea And Vomiting 03/03/2016   Ketorolac Itching 08/22/2018   Omega-3  05/23/2014   Other Other (See Comments) 05/23/2014   Parabens Other (See Comments) 05/23/2014   Tramadol Itching and Other (See Comments) 03/14/2015   Penicillins Rash, Other (See Comments), and Hives 08/24/71    Medications: No outpatient medications have been marked as taking for the 07/24/22 encounter (Appointment) with Venetia Night, MD.    Social History: Social History   Tobacco Use   Smoking status: Former  Types: Cigarettes    Quit date: 11/28/2014    Years since quitting: 7.6   Smokeless tobacco: Never  Vaping Use   Vaping Use: Never used  Substance Use Topics   Alcohol use: No    Alcohol/week: 0.0 standard drinks of alcohol   Drug use: No    Family Medical History: Family History  Problem Relation Age of Onset   Diabetes Mother    Hypertension Mother    Stroke Father     Physical Examination: There were no vitals filed  for this visit.  General: Patient is well developed, well nourished, calm, collected, and in no apparent distress. Attention to examination is appropriate.  Neck:   Supple.  Full range of motion.  Respiratory: Patient is breathing without any difficulty.   NEUROLOGICAL:     Awake, alert, oriented to person, place, and time.  Speech is clear and fluent. Fund of knowledge is appropriate.   Cranial Nerves: Pupils equal round and reactive to light.  Facial tone is symmetric.  Facial sensation is symmetric. Shoulder shrug is symmetric. Tongue protrusion is midline.  There is no pronator drift.  ROM of spine: full.    Strength: Side Biceps Triceps Deltoid Interossei Grip Wrist Ext. Wrist Flex.  R 5 5 5 5 5 5 5   L 5 5 5 5 5 5 5    Side Iliopsoas Quads Hamstring PF DF EHL  R 5 5 5 5 5 5   L 5 5 5 5 5 5    Reflexes are 1+ and symmetric at the biceps, triceps, brachioradialis, patella and achilles.   Hoffman's is absent.  Clonus is not present.  Toes are down-going.  Bilateral upper and lower extremity sensation is intact to light touch.    No evidence of dysmetria noted.  Gait is normal.    Medical Decision Making  Imaging: MRI C spine 06/13/22 IMPRESSION: 1. Moderate right neural foraminal narrowing at C3-4. 2. Severe right neural foraminal narrowing at C5-6. 3. No high-grade spinal canal stenosis.     Electronically Signed   By: M.D.   On: 06/16/2022 10:56  I have personally reviewed the images and agree with the above interpretation.  Assessment and Plan: [name] is a pleasant 51 y.o. male with dysphagia with an anterior osteophyte at C5-6.  He also has neck pain with possible cervical radiculopathy.  He has history of right shoulder surgery, but has had work-up of this and this is not felt to be the issue.  I would like to send him for physical therapy and injection for his neck.  For swallowing, we will send him for barium esophagram.  We  will see him back in approximately 8 weeks.   I spent a total of 30 minutes in face-to-face and non-face-to-face activities related to this patient's care today.  Thank you for involving me in the care of this patient.      Clarinda Obi K. Baldemar Lenis MD, [facility] Neurosurgery

## 2022-07-24 ENCOUNTER — Ambulatory Visit (INDEPENDENT_AMBULATORY_CARE_PROVIDER_SITE_OTHER): Payer: Medicare Other | Admitting: Neurosurgery

## 2022-07-24 ENCOUNTER — Encounter: Payer: Self-pay | Admitting: Neurosurgery

## 2022-07-24 VITALS — BP 104/74 | HR 80 | Ht 69.0 in | Wt 211.0 lb

## 2022-07-24 DIAGNOSIS — R1310 Dysphagia, unspecified: Secondary | ICD-10-CM | POA: Diagnosis not present

## 2022-07-24 DIAGNOSIS — M4722 Other spondylosis with radiculopathy, cervical region: Secondary | ICD-10-CM | POA: Diagnosis not present

## 2022-07-24 DIAGNOSIS — M5412 Radiculopathy, cervical region: Secondary | ICD-10-CM | POA: Diagnosis not present

## 2022-08-29 ENCOUNTER — Ambulatory Visit
Admission: RE | Admit: 2022-08-29 | Discharge: 2022-08-29 | Disposition: A | Discharge status: Home / Self Care | Payer: Self-pay | Source: Ambulatory Visit | Attending: Orthopedic Surgery | Admitting: Orthopedic Surgery

## 2022-08-29 ENCOUNTER — Ambulatory Visit
Admission: RE | Admit: 2022-08-29 | Discharge: 2022-08-29 | Disposition: A | Discharge status: Home / Self Care | Payer: Self-pay | Source: Ambulatory Visit | Attending: Neurosurgery | Admitting: Neurosurgery

## 2022-08-29 ENCOUNTER — Other Ambulatory Visit: Payer: Self-pay

## 2022-08-29 DIAGNOSIS — Z049 Encounter for examination and observation for unspecified reason: Secondary | ICD-10-CM

## 2022-09-01 NOTE — Telephone Encounter (Signed)
Made appt with PA

## 2022-09-02 ENCOUNTER — Encounter: Payer: Self-pay | Admitting: Neurosurgery

## 2022-09-02 NOTE — Telephone Encounter (Signed)
I left a message for the scheduling department at [facility] to return my call.

## 2022-09-05 ENCOUNTER — Ambulatory Visit (INDEPENDENT_AMBULATORY_CARE_PROVIDER_SITE_OTHER): Payer: Medicare Other | Admitting: Physician Assistant

## 2022-09-05 VITALS — BP 98/68 | HR 80 | Ht 69.0 in | Wt 212.0 lb

## 2022-09-05 DIAGNOSIS — N3941 Urge incontinence: Secondary | ICD-10-CM

## 2022-09-05 DIAGNOSIS — R351 Nocturia: Secondary | ICD-10-CM | POA: Diagnosis not present

## 2022-09-05 DIAGNOSIS — R3912 Poor urinary stream: Secondary | ICD-10-CM

## 2022-09-05 LAB — BLADDER SCAN AMB NON-IMAGING

## 2022-09-05 NOTE — Progress Notes (Signed)
09/05/2022 2:50 PM   Daniel [name] 1971/10/26 [id number]  CC: Chief Complaint  Patient presents with   Follow-up   HPI: Daniel [name] is a 51 y.o. male with PMH diabetes, ED on Cialis 5 mg daily, and LUTS including nocturia and weak stream who presents today to discuss obtaining a prescription for incontinence supplies.   Today he reports his nocturia has resolved but he continues to have some weak stream on low-dose daily Cialis and after discontinuing alfuzosin.  He recently traveled out of state and had an episode of large volume urge incontinence while he was traveling.  He would like to have disposable absorbent products available for use specifically while traveling, because he does not leak as much on a regular basis.    He denies dysuria and gross hematuria.  PVR 0 mL.  PMH: Past Medical History:  Diagnosis Date   Asthma    Bipolar 1 disorder (HCC)    Chronic back pain    DDD (degenerative disc disease), cervical    Depression    Diabetes mellitus without complication (HCC)    ED (erectile dysfunction)    Elevated lipids    GERD (gastroesophageal reflux disease)    Headache    Insomnia    Migraine    Seasonal allergies    Wheezing     Surgical History: Past Surgical History:  Procedure Laterality Date   CHOLECYSTECTOMY     COLONOSCOPY W/ BIOPSIES     FRACTURE SURGERY     tail bone   knee arthroscpy Right    SHOULDER ARTHROSCOPY WITH DISTAL CLAVICLE RESECTION Right 10/13/2017   Procedure: SHOULDER ARTHROSCOPY WITH DISTAL CLAVICLE RESECTION;  Surgeon: Christena Flake, MD;  Location: [facility];  Service: Orthopedics;  Laterality: Right;   SHOULDER ARTHROSCOPY WITH OPEN ROTATOR CUFF REPAIR Right 10/13/2017   Procedure: SHOULDER ARTHROSCOPY WITH OPEN ROTATOR CUFF REPAIR/;  Surgeon: Christena Flake, MD;  Location: [facility];  Service: Orthopedics;  Laterality: Right;   TOTAL KNEE ARTHROPLASTY Right 12/06/2019   Procedure: TOTAL KNEE ARTHROPLASTY;  Surgeon: Kennedy Bucker, MD;  Location: [facility];  Service: Orthopedics;  Laterality: Right;   TOTAL KNEE ARTHROPLASTY Right 05/14/2021   Procedure: REVISION TOTAL KNEE ARTHROPLASTY - [place] Neighbor to Assist;  Surgeon: Kennedy Bucker, MD;  Location: [facility];  Service: Orthopedics;  Laterality: Right;    Home Medications:  Allergies as of 09/05/2022       Reactions   Doxycycline Nausea And Vomiting   Other reaction(s): Vomiting   Ketorolac Itching   Other reaction(s): Unknown   Omega-3    Other reaction(s): Other (See Comments) Other reaction(s): Unknown Mercaptobenzothiazole   Other Other (See Comments)   Confirmed by allergy testing Mercaptobenzothiazole Other reaction(s): Unknown Other reaction(s): Other (See Comments) Mercaptobenzothiazole Mercaptobenzothiazole   Parabens Other (See Comments)   Confirmed by allergy testing Other reaction(s): Unknown Other reaction(s): Other (See Comments) Other reaction(s): Unknown   Tramadol Itching, Other (See Comments)   Tolerates extended release Other reaction(s): Other (See Comments) Other reaction(s): Other (see comments), Other (See Comments) Tolerates extended release Tolerates extended release Tolerates extended release Tolerates extended release Tolerates extended release Tolerates extended release   Penicillins Rash, Other (See Comments), Hives   Has patient had a PCN reaction causing immediate rash, facial/tongue/throat swelling, SOB or lightheadedness with hypotension: Yes Has patient had a PCN reaction causing severe rash involving mucus membranes or skin necrosis: No Has patient had a PCN reaction that required hospitalization No Has patient had  a PCN reaction occurring within the last 10 years: No If all of the above answers are "NO", then may proceed with Cephalosporin use. Other reaction(s): Other Other reaction(s): Other (See Comments) Has patient had a PCN reaction causing immediate rash, facial/tongue/throat swelling, SOB or  lightheadedness with hypotension: Yes Has patient had a PCN reaction causing severe rash involving mucus membranes or skin necrosis: No Has patient had a PCN reaction that required hospitalization No Has patient had a PCN reaction occurring within the last 10 years: No If all of the above answers are "NO", then may proceed with Cephalosporin use. Can take keflexCan take keflex Has patient had a PCN reaction causing immediate rash, facial/tongue/throat swelling, SOB or lightheadedness with hypotension: Yes Has patient had a PCN reaction causing severe rash involving mucus membranes or skin necrosis: No Has patient had a PCN reaction that required hospitalization No Has patient had a PCN reaction occurring within the last 10 years: No If all of the above answers are "NO", then may proceed with Cephalosporin use. Other reaction(s): Unknown        Medication List        Accurate as of September 05, 2022  2:50 PM. If you have any questions, ask your nurse or doctor.          STOP taking these medications    clindamycin 300 MG capsule Commonly known as: CLEOCIN       TAKE these medications    albuterol 108 (90 Base) MCG/ACT inhaler Commonly known as: VENTOLIN HFA Inhale 1 puff into the lungs every 4 (four) hours as needed for wheezing or shortness of breath.   albuterol (2.5 MG/3ML) 0.083% nebulizer solution Commonly known as: PROVENTIL Take 2.5 mg by nebulization every 4 (four) hours as needed for wheezing.   atorvastatin 40 MG tablet Commonly known as: LIPITOR Take by mouth daily.   budesonide 1 MG/2ML nebulizer solution Commonly known as: PULMICORT INHALE 1 VIAL VIA NEBULIZER ONCE DAILY   celecoxib 200 MG capsule Commonly known as: CELEBREX Take 1 capsule by mouth 2 (two) times daily.   Descovy 200-25 MG tablet Generic drug: emtricitabine-tenofovir AF Take 1 tablet by mouth daily.   divalproex 250 MG 24 hr tablet Commonly known as: DEPAKOTE ER Take 250 mg by  mouth in the morning and at bedtime.   DULoxetine 20 MG capsule Commonly known as: CYMBALTA Take 20 mg by mouth daily.   Emgality 120 MG/ML Soaj Generic drug: Galcanezumab-gnlm Inject 120 mg into the skin every 30 (thirty) days.   FLUoxetine 20 MG capsule Commonly known as: PROZAC Take 20 mg by mouth daily.   FreeStyle Libre 3 Sensor Misc As directedfor glucose monitoring   gabapentin 300 MG capsule Commonly known as: NEURONTIN Take 600 mg by mouth 3 (three) times daily.   Jardiance 10 MG Tabs tablet Generic drug: empagliflozin Take 10 mg by mouth daily.   melatonin 3 MG Tabs tablet Take 3 mg by mouth at bedtime.   montelukast 10 MG tablet Commonly known as: SINGULAIR Take 10 mg by mouth at bedtime.   multivitamin with minerals Tabs tablet Take 1 tablet by mouth daily.   nortriptyline 50 MG capsule Commonly known as: PAMELOR at bedtime. Take 40mg  nightly for one week, then increase to 50mg  nightly   pantoprazole 40 MG tablet Commonly known as: PROTONIX Take 40 mg by mouth 2 (two) times daily.   propranolol 10 MG tablet Commonly known as: INDERAL Take 10 mg by mouth 2 (two) times daily.  rizatriptan 10 MG tablet Commonly known as: MAXALT May take a second dose after 2 hours if needed.   tadalafil 5 MG tablet Commonly known as: CIALIS Take 1 tablet (5 mg total) by mouth daily.   tiZANidine 4 MG tablet Commonly known as: ZANAFLEX Take by mouth.   traZODone 50 MG tablet Commonly known as: DESYREL Take 50 mg by mouth at bedtime.   zonisamide 100 MG capsule Commonly known as: ZONEGRAN Take by mouth daily.        Allergies:  Allergies  Allergen Reactions   Doxycycline Nausea And Vomiting    Other reaction(s): Vomiting   Ketorolac Itching    Other reaction(s): Unknown   Omega-3     Other reaction(s): Other (See Comments) Other reaction(s): Unknown Mercaptobenzothiazole   Other Other (See Comments)    Confirmed by allergy  testing Mercaptobenzothiazole Other reaction(s): Unknown Other reaction(s): Other (See Comments) Mercaptobenzothiazole  Mercaptobenzothiazole   Parabens Other (See Comments)    Confirmed by allergy testing Other reaction(s): Unknown Other reaction(s): Other (See Comments) Other reaction(s): Unknown   Tramadol Itching and Other (See Comments)    Tolerates extended release Other reaction(s): Other (See Comments) Other reaction(s): Other (see comments), Other (See Comments) Tolerates extended release Tolerates extended release Tolerates extended release Tolerates extended release Tolerates extended release Tolerates extended release   Penicillins Rash, Other (See Comments) and Hives    Has patient had a PCN reaction causing immediate rash, facial/tongue/throat swelling, SOB or lightheadedness with hypotension: Yes Has patient had a PCN reaction causing severe rash involving mucus membranes or skin necrosis: No Has patient had a PCN reaction that required hospitalization No Has patient had a PCN reaction occurring within the last 10 years: No If all of the above answers are "NO", then may proceed with Cephalosporin use. Other reaction(s): Other Other reaction(s): Other (See Comments) Has patient had a PCN reaction causing immediate rash, facial/tongue/throat swelling, SOB or lightheadedness with hypotension: Yes Has patient had a PCN reaction causing severe rash involving mucus membranes or skin necrosis: No Has patient had a PCN reaction that required hospitalization No Has patient had a PCN reaction occurring within the last 10 years: No If all of the above answers are "NO", then may proceed with Cephalosporin use. Can take keflexCan take keflex Has patient had a PCN reaction causing immediate rash, facial/tongue/throat swelling, SOB or lightheadedness with hypotension: Yes Has patient had a PCN reaction causing severe rash involving mucus membranes or skin necrosis: No Has  patient had a PCN reaction that required hospitalization No Has patient had a PCN reaction occurring within the last 10 years: No If all of the above answers are "NO", then may proceed with Cephalosporin use. Other reaction(s): Unknown    Family History: Family History  Problem Relation Age of Onset   Diabetes Mother    Hypertension Mother    Stroke Father     Social History:   reports that he quit smoking about 7 years ago. His smoking use included cigarettes. He has never used smokeless tobacco. He reports that he does not drink alcohol and does not use drugs.  Physical Exam: BP 98/68   Pulse 80   Ht 5\' 9"  (1.753 m)   Wt 212 lb (96.2 kg)   BMI 31.31 kg/m   Constitutional:  Alert and oriented, no acute distress, nontoxic appearing HEENT: [place], AT Cardiovascular: No clubbing, cyanosis, or edema Respiratory: Normal respiratory effort, no increased work of breathing Skin: No rashes, bruises or suspicious lesions Neurologic:  Grossly intact, no focal deficits, moving all 4 extremities Psychiatric: Normal mood and affect  Laboratory Data: Results for orders placed or performed in visit on 09/05/22  BLADDER SCAN AMB NON-IMAGING  Result Value Ref Range   Scan Result    Assessment & Plan:   1. Urge incontinence Infrequent episodes of large volume urge incontinence, primarily associated with travel.  We will work with clinic staff to send a prescription to [facility], okay to use disposable underwear, shields, or pads as desired for urinary control while traveling. - BLADDER SCAN AMB NON-IMAGING   Return if symptoms worsen or fail to improve.  Carman Ching, PA-C  [facility] [address] [place], [place] [address] [phone]

## 2022-09-08 ENCOUNTER — Ambulatory Visit
Admission: RE | Admit: 2022-09-08 | Discharge: 2022-09-08 | Disposition: A | Discharge status: Home / Self Care | Payer: Medicare Other | Source: Ambulatory Visit | Attending: Neurosurgery | Admitting: Neurosurgery

## 2022-09-08 ENCOUNTER — Other Ambulatory Visit: Payer: Self-pay | Admitting: Neurosurgery

## 2022-09-08 DIAGNOSIS — M4722 Other spondylosis with radiculopathy, cervical region: Secondary | ICD-10-CM | POA: Diagnosis present

## 2022-09-08 DIAGNOSIS — R1310 Dysphagia, unspecified: Secondary | ICD-10-CM | POA: Insufficient documentation

## 2022-09-08 NOTE — Progress Notes (Signed)
Modified Barium Swallow Progress Note  Patient Details  Name: Daniel [name] MRN: [id number] Date of Birth: 1971-05-12  Today's Date: 09/08/2022  Modified Barium Swallow completed.  Full report located under Chart Review in the Imaging Section.  Brief recommendations include the following:  Clinical Impression  Pt presents with adequate oropharyngeal abilities when consuming thin liquids via spoon and cup, puree and graham crackers with puree. While pt does have osteophytes at C5-C6, these do not impeded bolus passage into esophagus. More concerning was stasis of food boluses in the cervical esophagus thru the thoracic esophagus (see last video imaging). Recommend pt follow up with GI for further evaluation of potential esophageal dysfunction. Video was reviewed with pt and all questions were answered to pt's satisfaction.   Swallow Evaluation Recommendations   Recommended Consults: Consider GI evaluation;Consider esophageal assessment   SLP Diet Recommendations: Regular solids;Thin liquid   Liquid Administration via: Cup   Medication Administration: Whole meds with liquid   Supervision: Patient able to self feed   Compensations: Minimize environmental distractions;Slow rate;Small sips/bites   Postural Changes: Remain semi-upright after after feeds/meals (Comment);Seated upright at 90 degrees   Oral Care Recommendations: Oral care BID     Reshma Hoey B. Rutherford Nail, M.S., CCC-SLP, Mining engineer Certified Brain Injury [place]  [place] Office [phone] Ascom [phone] Fax [phone]

## 2022-09-17 ENCOUNTER — Ambulatory Visit: Payer: Medicare Other

## 2022-09-18 ENCOUNTER — Encounter: Payer: Self-pay | Admitting: Neurosurgery

## 2022-09-18 ENCOUNTER — Ambulatory Visit (INDEPENDENT_AMBULATORY_CARE_PROVIDER_SITE_OTHER): Payer: Medicare Other | Admitting: Neurosurgery

## 2022-09-18 VITALS — BP 126/80 | Ht 69.0 in | Wt 210.4 lb

## 2022-09-18 DIAGNOSIS — M5412 Radiculopathy, cervical region: Secondary | ICD-10-CM | POA: Diagnosis not present

## 2022-09-18 DIAGNOSIS — R1310 Dysphagia, unspecified: Secondary | ICD-10-CM | POA: Diagnosis not present

## 2022-09-18 NOTE — Progress Notes (Signed)
Referring Physician:  Gayland Curry, MD [address] [place],  [place] [address]  Primary Physician:  Gayland Curry, MD  History of Present Illness: 09/18/2022 Since our visit, [name] has had an injection which did not help.  Daniel [name], who did not feel that his C5 6 anterior osteophyte was causing his swallowing difficulty.  Daniel [name] has had trouble getting into physical therapy due to scheduling limitations.  Daniel [name]  07/24/22 Mr. Kolt Mcwhirter is here today with a chief complaint of neck pain that radiates into his right shoulder.  Daniel [name] has had numbness in his right pinky for short period of time.  His neck pain has been ongoing for 3 months.  Nothing really helps or makes it worse.  Daniel [name] has pain to his right trapezius muscle.  It is constant.  Daniel [name]  Daniel [name] has tried some medications at home.  Daniel [name] has not had injections.  Daniel [name] also reports some issues with swallowing.  That has been bothering him. Bowel/Bladder Dysfunction: none  Conservative measures:  Physical therapy: has not participated in formal physical therapy, but Daniel [name] has been doing home exercises Multimodal medical therapy including regular antiinflammatories:  celebrex, tizanidine, meloxicam Injections:  has not received any epidural steroid injections  Past Surgery: Lumbar Surgery in February 2018 in [place] ([facility])  [name] has no symptoms of cervical myelopathy.  The symptoms are causing a significant impact on the patient's life.   Review of Systems:  A 10 point review of systems is negative, except for the pertinent positives and negatives detailed in the HPI.  Past Medical History: Past Medical History:  Diagnosis Date   Asthma    Bipolar 1 disorder ([place])    Chronic back pain    DDD (degenerative disc disease), cervical    Depression    Diabetes mellitus without complication (HCC)    ED  (erectile dysfunction)    Elevated lipids    GERD (gastroesophageal reflux disease)    Headache    Insomnia    Migraine    Seasonal allergies    Wheezing     Past Surgical History: Past Surgical History:  Procedure Laterality Date   CHOLECYSTECTOMY     COLONOSCOPY W/ BIOPSIES     FRACTURE SURGERY     tail bone   knee arthroscpy Right    SHOULDER ARTHROSCOPY WITH DISTAL CLAVICLE RESECTION Right 10/13/2017   Procedure: SHOULDER ARTHROSCOPY WITH DISTAL CLAVICLE RESECTION;  Surgeon: Corky Mull, MD;  Location: [facility];  Service: Orthopedics;  Laterality: Right;   SHOULDER ARTHROSCOPY WITH OPEN ROTATOR CUFF REPAIR Right 10/13/2017   Procedure: SHOULDER ARTHROSCOPY WITH OPEN ROTATOR CUFF REPAIR/;  Surgeon: Corky Mull, MD;  Location: [facility];  Service: Orthopedics;  Laterality: Right;   TOTAL KNEE ARTHROPLASTY Right 12/06/2019   Procedure: TOTAL KNEE ARTHROPLASTY;  Surgeon: Hessie Knows, MD;  Location: [facility];  Service: Orthopedics;  Laterality: Right;   TOTAL KNEE ARTHROPLASTY Right 05/14/2021   Procedure: REVISION TOTAL KNEE ARTHROPLASTY - Rachelle Hora to Assist;  Surgeon: Hessie Knows, MD;  Location: [facility];  Service: Orthopedics;  Laterality: Right;    Allergies: Allergies as of 09/18/2022 - Review Complete 09/05/2022  Allergen Reaction Noted   Doxycycline Nausea And Vomiting 03/03/2016   Ketorolac Itching 08/22/2018   Omega-3  05/23/2014   Other Other (See Comments) 05/23/2014   Parabens Other (See Comments) 05/23/2014  Tramadol Itching and Other (See Comments) 03/14/2015   Penicillins Rash, Other (See Comments), and Hives 1971-07-31    Medications: No outpatient medications have been marked as taking for the 09/18/22 encounter (Appointment) with Meade Maw, MD.    Social History: Social History   Tobacco Use   Smoking status: Former    Types: Cigarettes    Quit date: 11/28/2014    Years since quitting: 7.8   Smokeless tobacco: Never  Vaping Use    Vaping Use: Never used  Substance Use Topics   Alcohol use: No    Alcohol/week: 0.0 standard drinks of alcohol   Drug use: No    Family Medical History: Family History  Problem Relation Age of Onset   Diabetes Mother    Hypertension Mother    Stroke Father     Physical Examination: There were no vitals filed for this visit.  General: Patient is well developed, well nourished, calm, collected, and in no apparent distress. Attention to examination is appropriate.  Neck:   Supple.  Full range of motion.  Respiratory: Patient is breathing without any difficulty.   NEUROLOGICAL:     Awake, alert, oriented to person, place, and time.  Speech is clear and fluent. Fund of knowledge is appropriate.   Cranial Nerves: Pupils equal round and reactive to light.  Facial tone is symmetric.  Facial sensation is symmetric. Shoulder shrug is symmetric. Tongue protrusion is midline.  There is no pronator drift.  ROM of spine: full.    Strength: Side Biceps Triceps Deltoid Interossei Grip Wrist Ext. Wrist Flex.  R 5 5 5 5 5 5 5   L 5 5 5 5 5 5 5    Side Iliopsoas Quads Hamstring PF DF EHL  R 5 5 5 5 5 5   L 5 5 5 5 5 5    Reflexes are 1+ and symmetric at the biceps, triceps, brachioradialis, patella and achilles.   Hoffman's is absent.  Clonus is not present.  Toes are down-going.  Bilateral upper and lower extremity sensation is intact to light touch.    No evidence of dysmetria noted.  Gait is normal.    Medical Decision Making  Imaging: MRI C spine 06/13/22 IMPRESSION: 1. Moderate right neural foraminal narrowing at C3-4. 2. Severe right neural foraminal narrowing at C5-6. 3. No high-grade spinal canal stenosis.     Electronically Signed   By: Pedro Earls M.D.   On: 06/16/2022 10:56  I have personally reviewed the images and agree with the above interpretation.  Assessment and Plan: Mr. Combee is a pleasant 51 y.o. male with dysphagia.  This is not due to  his C5-6 osteophyte.  Daniel [name]  For his possible radiculopathy, I recommended physical therapy.  Daniel [name] will call back for referral when Daniel [name] is within the window for referral.  I will see him back after Daniel [name] is done physical therapy.  Given his swallowing difficulty, we would favor posterior approach for any decompression surgery Daniel [name] might need.    I spent a total of 10 minutes in face-to-face and non-face-to-face activities related to this patient's care today.  Thank you for involving me in the care of this patient.      Carilyn Woolston K. Izora Ribas MD, [facility]

## 2022-09-29 ENCOUNTER — Encounter (INDEPENDENT_AMBULATORY_CARE_PROVIDER_SITE_OTHER): Payer: Self-pay

## 2022-09-30 ENCOUNTER — Ambulatory Visit
Admission: RE | Admit: 2022-09-30 | Discharge: 2022-09-30 | Disposition: A | Discharge status: Home / Self Care | Payer: Medicare Other | Source: Ambulatory Visit

## 2022-09-30 VITALS — BP 95/64 | HR 91 | Temp 98.5°F | Resp 18

## 2022-09-30 DIAGNOSIS — R42 Dizziness and giddiness: Secondary | ICD-10-CM | POA: Diagnosis not present

## 2022-09-30 DIAGNOSIS — F3181 Bipolar II disorder: Secondary | ICD-10-CM | POA: Insufficient documentation

## 2022-09-30 NOTE — ED Provider Notes (Signed)
Daniel [name]    CSN: [id number] Arrival date & time: 09/30/22  0911      History   Chief Complaint Chief Complaint  Patient presents with   Dizziness    Everything is spinning around and around - Entered by patient    HPI [name] is a 51 y.o. male.    Dizziness   Presents to UC with complaint of dizziness starting yesterday.  Patient states symptoms are "spinning".  Previous episodes of the same.  Patient states that symptoms are generally elicited when he bends over.  States that when he gets up everything is spinning around.  Past Medical History:  Diagnosis Date   Asthma    Bipolar 1 disorder ([place])    Chronic back pain    DDD (degenerative disc disease), cervical    Depression    Diabetes mellitus without complication ([place])    ED (erectile dysfunction)    Elevated lipids    GERD (gastroesophageal reflux disease)    Headache    Insomnia    Migraine    Seasonal allergies    Wheezing     Patient Active Problem List   Diagnosis Date Noted   Bipolar II disorder, mild, hypomanic, with mixed features, in full remission ([place]) 09/30/2022   Photophobia 10/28/2021   S/P revision of total knee, 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   Elevated blood-pressure reading, without diagnosis of hypertension 01/30/2021   Left arm pain 11/27/2020   Nausea 11/27/2020   Headache disorder 03/30/2020   Status post total knee replacement using cement, 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   Lower urinary tract symptoms (LUTS) 05/13/2019   Recurrent bronchospasm 03/15/2019   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   Incomplete tear of right rotator cuff 10/15/2017   Tendinitis of upper biceps tendon of right shoulder 10/15/2017   Seborrhea 09/23/2017   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 09/09/2017   Rotator cuff tendinitis, right 09/09/2017   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   Displacement of lumbar intervertebral disc without myelopathy 04/04/2015   Facet  arthritis of lumbar region 03/14/2015   DDD (degenerative disc disease), lumbar 01/02/2015   Lumbar radiculitis 01/02/2015   DM2 (diabetes mellitus, type 2) ([place]) 10/30/2013   Knee joint effusion 03/14/2013   Left knee pain 03/14/2013   Articular cartilage disorder, other specified site 11/09/2012   Chronic back pain 09/10/2012    Past Surgical History:  Procedure Laterality Date   CHOLECYSTECTOMY     COLONOSCOPY W/ BIOPSIES     FRACTURE SURGERY     tail bone   knee arthroscpy Right    SHOULDER ARTHROSCOPY WITH DISTAL CLAVICLE RESECTION Right 10/13/2017   Procedure: SHOULDER ARTHROSCOPY WITH DISTAL CLAVICLE RESECTION;  Surgeon: Corky Mull, MD;  Location: [facility] ORS;  Service: Orthopedics;  Laterality: Right;   SHOULDER ARTHROSCOPY WITH OPEN ROTATOR CUFF REPAIR Right 10/13/2017   Procedure: SHOULDER ARTHROSCOPY WITH OPEN ROTATOR CUFF REPAIR/;  Surgeon: Corky Mull, MD;  Location: [facility];  Service: Orthopedics;  Laterality: Right;   TOTAL KNEE ARTHROPLASTY Right 12/06/2019   Procedure: TOTAL KNEE ARTHROPLASTY;  Surgeon: Hessie Knows, MD;  Location: [facility];  Service: Orthopedics;  Laterality: Right;   TOTAL KNEE ARTHROPLASTY Right 05/14/2021   Procedure: REVISION TOTAL KNEE ARTHROPLASTY - Rachelle Hora to Assist;  Surgeon: Hessie Knows, MD;  Location: [facility];  Service: Orthopedics;  Laterality: Right;       Home Medications    Prior to Admission medications   Medication Sig Start Date End Date Taking?  Authorizing Provider  pseudoephedrine-guaifenesin (MUCINEX D) 60-600 MG 12 hr tablet Take 1 tablet by mouth every 12 (twelve) hours. 09/29/22  Yes [provider]  tiZANidine (ZANAFLEX) 4 MG tablet Take by mouth. 09/24/22  Yes [provider]  albuterol (PROVENTIL HFA;VENTOLIN HFA) 108 (90 Base) MCG/ACT inhaler Inhale 1 puff into the lungs every 4 (four) hours as needed for wheezing or shortness of breath.     [provider]  albuterol (PROVENTIL) (2.5  MG/3ML) 0.083% nebulizer solution Take 2.5 mg by nebulization every 4 (four) hours as needed for wheezing.     [provider]  atorvastatin (LIPITOR) 40 MG tablet Take by mouth daily. 05/06/22 05/06/23  [provider]  budesonide (PULMICORT) 1 MG/2ML nebulizer solution INHALE 1 VIAL VIA NEBULIZER ONCE DAILY 05/26/22   [provider]  celecoxib (CELEBREX) 200 MG capsule Take 1 capsule by mouth 2 (two) times daily. 06/23/22   [provider]  Continuous Blood Gluc Sensor (FREESTYLE LIBRE 3 SENSOR) MISC As directedfor glucose monitoring 05/12/22   [provider]  divalproex (DEPAKOTE ER) 250 MG 24 hr tablet Take 250 mg by mouth in the morning and at bedtime.    [provider]  DULoxetine (CYMBALTA) 20 MG capsule Take 20 mg by mouth daily.    [provider]  empagliflozin (JARDIANCE) 10 MG TABS tablet Take 10 mg by mouth daily.    [provider]  emtricitabine-tenofovir AF (DESCOVY) 200-25 MG tablet Take 1 tablet by mouth daily. 11/15/20   [provider]  FLUoxetine (PROZAC) 20 MG capsule Take 20 mg by mouth daily.    [provider]  gabapentin (NEURONTIN) 300 MG capsule Take 600 mg by mouth 3 (three) times daily.     [provider]  Galcanezumab-gnlm (EMGALITY) 120 MG/ML SOAJ Inject 120 mg into the skin every 30 (thirty) days.    [provider]  Melatonin 3 MG TABS Take 3 mg by mouth at bedtime.     [provider]  montelukast (SINGULAIR) 10 MG tablet Take 10 mg by mouth at bedtime.    [provider]  Multiple Vitamin (MULTIVITAMIN WITH MINERALS) TABS tablet Take 1 tablet by mouth daily.    [provider]  nortriptyline (PAMELOR) 50 MG capsule at bedtime. Take 40mg  nightly for one week, then increase to 50mg  nightly 06/26/22   [provider]  pantoprazole (PROTONIX) 40 MG tablet Take 40 mg by mouth 2 (two) times daily.    [provider]   prednisoLONE acetate (PRED FORTE) 1 % ophthalmic suspension Place 1 drop into the right eye 3 (three) times daily. 09/24/22   [provider]  propranolol (INDERAL) 10 MG tablet Take 10 mg by mouth 2 (two) times daily.    [provider]  rizatriptan (MAXALT) 10 MG tablet May take a second dose after 2 hours if needed. 06/26/22   [provider]  tadalafil (CIALIS) 5 MG tablet Take 1 tablet (5 mg total) by mouth daily. 07/21/22   Billey Co, MD  tiZANidine (ZANAFLEX) 4 MG tablet Take by mouth. 06/09/22   [provider]  traZODone (DESYREL) 50 MG tablet Take 50 mg by mouth at bedtime.    [provider]  zonisamide (ZONEGRAN) 100 MG capsule Take by mouth daily. 06/26/22   [provider]  famotidine (PEPCID) 20 MG tablet Take 1 tablet (20 mg total) by mouth 2 (two) times daily. 03/05/18 06/01/20  Darel Hong, MD    Family  History Family History  Problem Relation Age of Onset   Diabetes Mother    Hypertension Mother    Stroke Father     Social History Social History   Tobacco Use   Smoking status: Former    Types: Cigarettes    Quit date: 11/28/2014    Years since quitting: 7.8   Smokeless tobacco: Never  Vaping Use   Vaping Use: Never used  Substance Use Topics   Alcohol use: No    Alcohol/week: 0.0 standard drinks of alcohol   Drug use: No     Allergies   Doxycycline, Ketorolac, Omega-3, Other, Parabens, Tramadol, and Penicillins   Review of Systems Review of Systems  Neurological:  Positive for dizziness.     Physical Exam Triage Vital Signs ED Triage Vitals  Enc Vitals Group     BP 09/30/22 0936 95/64     Pulse Rate 09/30/22 0936 91     Resp 09/30/22 0936 18     Temp 09/30/22 0936 98.5 F (36.9 C)     Temp Source 09/30/22 0936 Oral     SpO2 09/30/22 0936 96 %     Weight --      Height --      Head Circumference --      Peak Flow --      Pain Score 09/30/22 0932 0     Pain Loc --      Pain Edu?  --      Excl. in [place]? --    No data found.  Updated Vital Signs BP 95/64 (BP Location: Right Arm)   Pulse 91   Temp 98.5 F (36.9 C) (Oral)   Resp 18   SpO2 96%   Visual Acuity Right Eye Distance:   Left Eye Distance:   Bilateral Distance:    Right Eye Near:   Left Eye Near:    Bilateral Near:     Physical Exam Vitals reviewed.  Constitutional:      Appearance: Normal appearance.  Skin:    General: Skin is warm and dry.  Neurological:     General: No focal deficit present.     Mental Status: He is alert and oriented to person, place, and time.  Psychiatric:        Mood and Affect: Mood normal.        Behavior: Behavior normal.      UC Treatments / Results  Labs (all labs ordered are listed, but only abnormal results are displayed) Labs Reviewed - No data to display  EKG   Radiology No results found.  Procedures Procedures (including critical care time)  Medications Ordered in UC Medications - No data to display  Initial Impression / Assessment and Plan / UC Course  I have reviewed the triage vital signs and the nursing notes.  Pertinent labs & imaging results that were available during my care of the patient were reviewed by me and considered in my medical decision making (see chart for details).   Soft BP today.  Recent office visit indicates BP of 126/80.  95/64 today.  Patient denies diagnosis of hypertension or use of antihypertensive medications.  Patient description of symptoms is reminiscent of vertigo.  Dix-Hallpike maneuver is negative.  I suspect patient's symptoms are related to his blood pressure but there is nothing in his history to indicate the cause of his current symptoms.  Recommended patient increase his hydration including electrolyte.  History of DM 2 so avoiding drinks with high sugar content.  Will prescribe meclizine for use if symptoms continue.  Will obtain BMP to check hydration and electrolyte status.   Final Clinical  Impressions(s) / UC Diagnoses   Final diagnoses:  None   Discharge Instructions   None    ED Prescriptions   None    PDMP not reviewed this encounter.   Rose Phi, FNP 09/30/22 1011

## 2022-09-30 NOTE — Discharge Instructions (Addendum)
Please increase the amount of hydration as well as electrolytes.  Avoid beverages with sugar given your treatment for DM 2.  Follow up here or with your primary care provider if your symptoms are worsening or not improving.

## 2022-09-30 NOTE — ED Triage Notes (Signed)
Pt. Presents to UC w/ c/o dizziness that started yesterday. Pt, states "it feels like everything is spinning".

## 2022-10-01 LAB — BASIC METABOLIC PANEL
BUN/Creatinine Ratio: 15 (ref 9–20)
BUN: 18 mg/dL (ref 6–24)
CO2: 24 mmol/L (ref 20–29)
Calcium: 9.5 mg/dL (ref 8.7–10.2)
Chloride: 99 mmol/L (ref 96–106)
Creatinine, Ser: 1.2 mg/dL (ref 0.76–1.27)
Glucose: 224 mg/dL — ABNORMAL HIGH (ref 70–99)
Potassium: 4.6 mmol/L (ref 3.5–5.2)
Sodium: 137 mmol/L (ref 134–144)
eGFR: 73 mL/min/{1.73_m2} (ref >59)

## 2022-10-01 LAB — CBC WITH DIFFERENTIAL/PLATELET
Basophils Absolute: 0 10*3/uL (ref 0.0–0.2)
Basos: 1 %
EOS (ABSOLUTE): 0.2 10*3/uL (ref 0.0–0.4)
Eos: 3 %
Hematocrit: 42 % (ref 37.5–51.0)
Hemoglobin: 13.6 g/dL (ref 13.0–17.7)
Immature Grans (Abs): 0 10*3/uL (ref 0.0–0.1)
Immature Granulocytes: 0 %
Lymphocytes Absolute: 1.2 10*3/uL (ref 0.7–3.1)
Lymphs: 24 %
MCH: 26.5 pg — ABNORMAL LOW (ref 26.6–33.0)
MCHC: 32.4 g/dL (ref 31.5–35.7)
MCV: 82 fL (ref 79–97)
Monocytes Absolute: 0.4 10*3/uL (ref 0.1–0.9)
Monocytes: 9 %
Neutrophils Absolute: 3.1 10*3/uL (ref 1.4–7.0)
Neutrophils: 63 %
Platelets: 240 10*3/uL (ref 150–450)
RBC: 5.14 x10E6/uL (ref 4.14–5.80)
RDW: 13 % (ref 11.6–15.4)
WBC: 4.9 10*3/uL (ref 3.4–10.8)

## 2022-10-06 ENCOUNTER — Encounter: Payer: Self-pay | Admitting: Neurosurgery

## 2022-10-13 ENCOUNTER — Ambulatory Visit (INDEPENDENT_AMBULATORY_CARE_PROVIDER_SITE_OTHER): Payer: Medicare Other | Admitting: Neurosurgery

## 2022-10-13 DIAGNOSIS — M5412 Radiculopathy, cervical region: Secondary | ICD-10-CM

## 2022-10-13 NOTE — Progress Notes (Signed)
Referring Physician:  Leim Fabry, MD [address] [place],  [place] [address]  Primary Physician:  Daniel Fabry, MD  History of Present Illness: 10/13/2022 [name] reports continued tightness in his R shoulder blade and neck.  [name] feels it is not better. [name]   09/18/2022 Since our visit, Daniel [name] has had an injection which did not help.  [name], who did not feel that his C5 6 anterior osteophyte was causing his swallowing difficulty.  [name] has had trouble getting into physical therapy due to scheduling limitations.  [name]  07/24/22 Daniel [name] is here today with a chief complaint of neck pain that radiates into his right shoulder.  [name] has had numbness in his right pinky for short period of time.  His neck pain has been ongoing for 3 months.  Nothing really helps or makes it worse.  [name] has pain to his right trapezius muscle.  It is constant.  [name]  [name] has tried some medications at home.  [name] has not had injections.  [name] also reports some issues with swallowing.  That has been bothering him. Bowel/Bladder Dysfunction: none  Conservative measures:  Physical therapy: has not participated in formal physical therapy, but [name] has been doing home exercises Multimodal medical therapy including regular antiinflammatories:  celebrex, tizanidine, meloxicam Injections:  has not received any epidural steroid injections  Past Surgery: Lumbar Surgery in February 2018 in [place] ([facility])  Daniel [name] has no symptoms of cervical myelopathy.  The symptoms are causing a significant impact on the patient's life.   Review of Systems:  A 10 point review of systems is negative, except for the pertinent positives and negatives detailed in the HPI.  Past Medical History: Past Medical History:  Diagnosis Date   Asthma    Bipolar 1 disorder  ([facility])    Chronic back pain    DDD (degenerative disc disease), cervical    Depression    Diabetes mellitus without complication ([facility])    ED (erectile dysfunction)    Elevated lipids    GERD (gastroesophageal reflux disease)    Headache    Insomnia    Migraine    Seasonal allergies    Wheezing     Past Surgical History: Past Surgical History:  Procedure Laterality Date   CHOLECYSTECTOMY     COLONOSCOPY W/ BIOPSIES     FRACTURE SURGERY     tail bone   knee arthroscpy Right    SHOULDER ARTHROSCOPY WITH DISTAL CLAVICLE RESECTION Right 10/13/2017   Procedure: SHOULDER ARTHROSCOPY WITH DISTAL CLAVICLE RESECTION;  Surgeon: Daniel Flake, MD;  Location: [facility];  Service: Orthopedics;  Laterality: Right;   SHOULDER ARTHROSCOPY WITH OPEN ROTATOR CUFF REPAIR Right 10/13/2017   Procedure: SHOULDER ARTHROSCOPY WITH OPEN ROTATOR CUFF REPAIR/;  Surgeon: Daniel Flake, MD;  Location: [facility];  Service: Orthopedics;  Laterality: Right;   TOTAL KNEE ARTHROPLASTY Right 12/06/2019   Procedure: TOTAL KNEE ARTHROPLASTY;  Surgeon: Daniel Bucker, MD;  Location: [facility];  Service: Orthopedics;  Laterality: Right;   TOTAL KNEE ARTHROPLASTY Right 05/14/2021   Procedure: REVISION TOTAL KNEE ARTHROPLASTY - [place]ston Neighbor to Assist;  Surgeon: Daniel Bucker, MD;  Location: [facility];  Service: Orthopedics;  Laterality: Right;    Allergies: Allergies as of 10/13/2022 - Review Complete 09/30/2022  Allergen Reaction Noted   Doxycycline Nausea  And Vomiting 03/03/2016   Ketorolac Itching 08/22/2018   Omega-3  05/23/2014   Other Other (See Comments) 05/23/2014   Parabens Other (See Comments) 05/23/2014   Tramadol Itching and Other (See Comments) 03/14/2015   Penicillins Rash, Other (See Comments), and Hives 1971[birth year]    Medications: No outpatient medications have been marked as taking for the 10/13/22 encounter (Appointment) with Daniel Maw, MD.    Social History: Social History   Tobacco Use    Smoking status: Former    Types: Cigarettes    Quit date: 11/28/2014    Years since quitting: 7.8   Smokeless tobacco: Never  Vaping Use   Vaping Use: Never used  Substance Use Topics   Alcohol use: No    Alcohol/week: 0.0 standard drinks of alcohol   Drug use: No    Family Medical History: Family History  Problem Relation Age of Onset   Diabetes Mother    Hypertension Mother    Stroke Father     Physical Examination:  None today - phone visit  Medical Decision Making  Imaging: MRI C spine 06/13/22 IMPRESSION: 1. Moderate right neural foraminal narrowing at C3-4. 2. Severe right neural foraminal narrowing at C5-6. 3. No high-grade spinal canal stenosis.     Electronically Signed   By: Daniel [name] M.D.   On: 06/16/2022 10:56  I have personally reviewed the images and agree with the above interpretation.  Assessment and Plan: Daniel [name] is a pleasant 51 y.o. male with dysphagia.  This is not due to his C5-6 osteophyte.  [name]  For his likely cervical radiculopathy, [name] will continue physical therapy for now.  I will see him back after Thanksgiving. If [name] continues to have pain at that point, I will recommend R C5-6 foraminotomy.   This visit was performed via telephone.  Patient location: home Provider location: office  I spent a total of 5 minutes non-face-to-face activities for this visit on the date of this encounter including review of current clinical condition and response to treatment.     Daniel Barbe K. Izora Ribas MD, [facility] Neurosurgery

## 2022-10-21 ENCOUNTER — Encounter: Payer: Self-pay | Admitting: Neurosurgery

## 2022-11-04 ENCOUNTER — Ambulatory Visit (INDEPENDENT_AMBULATORY_CARE_PROVIDER_SITE_OTHER): Payer: Medicare Other | Admitting: Neurosurgery

## 2022-11-04 ENCOUNTER — Encounter: Payer: Self-pay | Admitting: Neurosurgery

## 2022-11-04 ENCOUNTER — Other Ambulatory Visit: Payer: Self-pay

## 2022-11-04 VITALS — BP 110/60 | Ht 69.0 in | Wt 210.0 lb

## 2022-11-04 DIAGNOSIS — Z01818 Encounter for other preprocedural examination: Secondary | ICD-10-CM

## 2022-11-04 DIAGNOSIS — M5412 Radiculopathy, cervical region: Secondary | ICD-10-CM | POA: Diagnosis not present

## 2022-11-04 NOTE — Addendum Note (Signed)
Addended by: Sharlot Gowda on: 11/04/2022 01:39 PM   Modules accepted: Orders

## 2022-11-04 NOTE — Patient Instructions (Signed)
Please see below for information in regards to your upcoming surgery:  Planned surgery: Right C5-6 foraminotomy     Surgery date: 11/19/22 - you will find out your arrival time the business day before your surgery.   Pre-op appointment at [facility] Pre-admit Testing: we will call you with a date/time for this. Pre-admit testing is located on the first floor of the [facility], [address] [facility] [address]. Please bring all prescriptions in the original prescription bottles to your appointment, even if you have reviewed medications by phone with a pharmacy representative. Pre-op labs may be done at your pre-op appointment. You are not required to fast for these labs. Should you need to change your pre-op appointment, please call Pre-admit testing at [phone].    Surgical clearance: we will send a clearance request to Dr Nicky Pugh - hold for 3 days prior to surgery    Home health physical therapy: [facility] will contact you regarding home health physical therapy for after surgery.Their number is [phone].    If you have FMLA/disability paperwork, please drop it off or fax it to [phone], attention [place].   We can be reached by phone or mychart 8am-4pm, Monday-Friday. If you have any questions/concerns before or after surgery, you can reach [place] at [phone], or you can send a mychart message. If you have a concern after hours that cannot wait until normal business hours, you can call [phone] and ask to page the neurosurgeon on call for [place].     Appointments/FMLA & disability paperwork: [place] Nurse: Royston Cowper  Medical assistant: Irving Burton Physician Assistant's: Manning Charity & Drake Leach Surgeon: Venetia Night, MD

## 2022-11-04 NOTE — H&P (View-Only) (Signed)
  Referring Physician:  Aldridge, Barbara, MD [address] [place] [address] [place],  [place] [address]  Primary Physician:  Aldridge, Barbara, MD  History of Present Illness: 11/04/2022 Daniel [name] returns to see me.  [name] continues to have pain down his right arm.  [name] has been doing physical therapy for over 2 months.  [name] did have one episode of numbness in his left hand, but that has improved.  10/14/2022 [name] reports continued tightness in his R shoulder blade and neck.  [name] feels it is not better. [name]   09/18/2022 Since our visit, Daniel [name] has had an injection which did not help.  [name], who did not feel that his C5 6 anterior osteophyte was causing his swallowing difficulty.  [name] has had trouble getting into physical therapy due to scheduling limitations.  [name]  07/24/22 Daniel [name] is here today with a chief complaint of neck pain that radiates into his right shoulder.  [name] has had numbness in his right pinky for short period of time.  His neck pain has been ongoing for 3 months.  Nothing really helps or makes it worse.  [name] has pain to his right trapezius muscle.  It is constant.  [name]  [name] has tried some medications at home.  [name] has not had injections.  [name] also reports some issues with swallowing.  That has been bothering him. Bowel/Bladder Dysfunction: none  Conservative measures:  Physical therapy: has not participated in formal physical therapy, but [name] has been doing home exercises Multimodal medical therapy including regular antiinflammatories:  celebrex, tizanidine, meloxicam Injections:  has not received any epidural steroid injections  Past Surgery: Lumbar Surgery in February 2018 in [place] ([facility])  Daniel [name] has no symptoms of cervical myelopathy.  The symptoms are causing a significant impact on the patient's life.   Review of  Systems:  A 10 point review of systems is negative, except for the pertinent positives and negatives detailed in the HPI.  Past Medical History: Past Medical History:  Diagnosis Date   Asthma    Bipolar 1 disorder (HCC)    Chronic back pain    DDD (degenerative disc disease), cervical    Depression    Diabetes mellitus without complication (HCC)    ED (erectile dysfunction)    Elevated lipids    GERD (gastroesophageal reflux disease)    Headache    Insomnia    Migraine    Seasonal allergies    Wheezing     Past Surgical History: Past Surgical History:  Procedure Laterality Date   CHOLECYSTECTOMY     COLONOSCOPY W/ BIOPSIES     FRACTURE SURGERY     tail bone   knee arthroscpy Right    SHOULDER ARTHROSCOPY WITH DISTAL CLAVICLE RESECTION Right 10/13/2017   Procedure: SHOULDER ARTHROSCOPY WITH DISTAL CLAVICLE RESECTION;  Surgeon: Poggi, John J, MD;  Location: [facility];  Service: Orthopedics;  Laterality: Right;   SHOULDER ARTHROSCOPY WITH OPEN ROTATOR CUFF REPAIR Right 10/13/2017   Procedure: SHOULDER ARTHROSCOPY WITH OPEN ROTATOR CUFF REPAIR/;  Surgeon: Poggi, John J, MD;  Location: [facility];  Service: Orthopedics;  Laterality: Right;   TOTAL KNEE ARTHROPLASTY Right 12/06/2019   Procedure: TOTAL KNEE ARTHROPLASTY;  Surgeon: Menz, Michael, MD;  Location: [facility];  Service: Orthopedics;  Laterality: Right;   TOTAL KNEE ARTHROPLASTY Right 05/14/2021     Procedure: REVISION TOTAL KNEE ARTHROPLASTY - [place] Neighbor to Assist;  Surgeon: Kennedy Bucker, MD;  Location: [facility];  Service: Orthopedics;  Laterality: Right;    Allergies: Allergies as of 11/04/2022 - Review Complete 11/04/2022  Allergen Reaction Noted   Doxycycline Nausea And Vomiting 03/03/2016   Ketorolac Itching 08/22/2018   Omega-3  05/23/2014   Other Other (See Comments) 05/23/2014   Parabens Other (See Comments) 05/23/2014   Tramadol Itching and Other (See Comments) 03/14/2015   Penicillins Rash, Other (See Comments),  and Hives 30-Jul-1971    Medications: Current Meds  Medication Sig   albuterol (PROVENTIL HFA;VENTOLIN HFA) 108 (90 Base) MCG/ACT inhaler Inhale 1 puff into the lungs every 4 (four) hours as needed for wheezing or shortness of breath.    albuterol (PROVENTIL) (2.5 MG/3ML) 0.083% nebulizer solution Take 2.5 mg by nebulization every 4 (four) hours as needed for wheezing.    atorvastatin (LIPITOR) 40 MG tablet Take by mouth daily.   budesonide (PULMICORT) 1 MG/2ML nebulizer solution INHALE 1 VIAL VIA NEBULIZER ONCE DAILY   celecoxib (CELEBREX) 200 MG capsule Take 1 capsule by mouth 2 (two) times daily.   Continuous Blood Gluc Sensor (FREESTYLE LIBRE 3 SENSOR) MISC As directedfor glucose monitoring   divalproex (DEPAKOTE ER) 250 MG 24 hr tablet Take 250 mg by mouth in the morning and at bedtime.   DULoxetine (CYMBALTA) 20 MG capsule Take 20 mg by mouth daily.   empagliflozin (JARDIANCE) 10 MG TABS tablet Take 10 mg by mouth daily.   emtricitabine-tenofovir AF (DESCOVY) 200-25 MG tablet Take 1 tablet by mouth daily.   FLUoxetine (PROZAC) 20 MG capsule Take 20 mg by mouth daily.   gabapentin (NEURONTIN) 300 MG capsule Take 600 mg by mouth 3 (three) times daily.    Galcanezumab-gnlm (EMGALITY) 120 MG/ML SOAJ Inject 120 mg into the skin every 30 (thirty) days.   Melatonin 3 MG TABS Take 3 mg by mouth at bedtime.    montelukast (SINGULAIR) 10 MG tablet Take 10 mg by mouth at bedtime.   Multiple Vitamin (MULTIVITAMIN WITH MINERALS) TABS tablet Take 1 tablet by mouth daily.   nortriptyline (PAMELOR) 50 MG capsule at bedtime. Take 40mg  nightly for one week, then increase to 50mg  nightly   pantoprazole (PROTONIX) 40 MG tablet Take 40 mg by mouth 2 (two) times daily.   prednisoLONE acetate (PRED FORTE) 1 % ophthalmic suspension Place 1 drop into the right eye 3 (three) times daily.   propranolol (INDERAL) 10 MG tablet Take 10 mg by mouth 2 (two) times daily.   pseudoephedrine-guaifenesin (MUCINEX D)  60-600 MG 12 hr tablet Take 1 tablet by mouth every 12 (twelve) hours.   rizatriptan (MAXALT) 10 MG tablet May take a second dose after 2 hours if needed.   tadalafil (CIALIS) 5 MG tablet Take 1 tablet (5 mg total) by mouth daily.   tiZANidine (ZANAFLEX) 4 MG tablet Take by mouth.   tiZANidine (ZANAFLEX) 4 MG tablet Take by mouth.   traZODone (DESYREL) 50 MG tablet Take 50 mg by mouth at bedtime.   zonisamide (ZONEGRAN) 100 MG capsule Take by mouth daily.    Social History: Social History   Tobacco Use   Smoking status: Former    Types: Cigarettes    Quit date: 11/28/2014    Years since quitting: 7.9   Smokeless tobacco: Never  Vaping Use   Vaping Use: Never used  Substance Use Topics   Alcohol use: No    Alcohol/week: 0.0 standard drinks of alcohol  Drug use: No    Family Medical History: Family History  Problem Relation Age of Onset   Diabetes Mother    Hypertension Mother    Stroke Father     Physical Examination: Vitals:   11/04/22 1312  BP: 110/60    General:          Patient is well developed, well nourished, calm, collected, and in no apparent distress. Attention to examination is appropriate.   Neck:               Supple.  Full range of motion.   Respiratory:     Patient is breathing without any difficulty.     NEUROLOGICAL:     Awake, alert, oriented to person, place, and time.  Speech is clear and fluent. Fund of knowledge is appropriate.    Cranial Nerves: Pupils equal round and reactive to light.  Facial tone is symmetric.  Facial sensation is symmetric. Shoulder shrug is symmetric. Tongue protrusion is midline.  There is no pronator drift.   ROM of spine: full.     Strength: Side Biceps Triceps Deltoid Interossei Grip Wrist Ext. Wrist Flex.  R 5 5 5 5 5 5 5  L 5 5 5 5 5 5 5    Side Iliopsoas Quads Hamstring PF DF EHL  R 5 5 5 5 5 5  L 5 5 5 5 5 5    Reflexes are 1+ and symmetric at the biceps, triceps, brachioradialis, patella and  achilles.   Hoffman's is absent.  Clonus is not present.  Toes are down-going.  Bilateral upper and lower extremity sensation is intact to light touch.    No evidence of dysmetria noted.   Gait is normal.    Medical Decision Making  Imaging: MRI C spine 06/13/22 IMPRESSION: 1. Moderate right neural foraminal narrowing at C3-4. 2. Severe right neural foraminal narrowing at C5-6. 3. No high-grade spinal canal stenosis.     Electronically Signed   By: Katyucia  de Macedo Rodrigues M.D.   On: 06/16/2022 10:56  I have personally reviewed the images and agree with the above interpretation.  Assessment and Plan: Daniel [name] is a pleasant 51 y.o. male with dysphagia.  This is not due to his C5-6 osteophyte.  [name]  [name] has symptoms concerning for right C6 radiculopathy.  [name] has tried physical therapy without improvement.  At this point, I think it is reasonable to move forward with surgical intervention with a right-sided C5-6 posterior foraminotomy.  I emphasized to him that maintaining his sugar control is very important for wound healing.  I discussed the planned procedure at length with the patient, including the risks, benefits, alternatives, and indications. The risks discussed include but are not limited to bleeding, infection, need for reoperation, spinal fluid leak, stroke, vision loss, anesthetic complication, coma, paralysis, and even death. I also described in detail that improvement was not guaranteed.  The patient expressed understanding of these risks, and asked that we proceed with surgery. I described the surgery in layman's terms, and gave ample opportunity for questions, which were answered to the best of my ability.      Shamarr Faucett K. Kamille Toomey MD, MPHS Neurosurgery 

## 2022-11-04 NOTE — Progress Notes (Signed)
Referring Physician:  Gayland Curry, MD [address] [place],  [place] [address]  Primary Physician:  Gayland Curry, MD  History of Present Illness: 11/04/2022 Mr. Stilson returns to see me.  He continues to have pain down his right arm.  He has been doing physical therapy for over 2 months.  He did have one episode of numbness in his left hand, but that has improved.  10/14/2022 He reports continued tightness in his R shoulder blade and neck.  He feels it is not better. He has been doing physical therapy.   09/18/2022 Since our visit, Mr. He has had an injection which did not help.  He was also evaluated by a speech pathologist, who did not feel that his C5 6 anterior osteophyte was causing his swallowing difficulty.  He has had trouble getting into physical therapy due to scheduling limitations.  He continues to have right arm pain.  07/24/22 Mr. Jocqui Dixon is here today with a chief complaint of neck pain that radiates into his right shoulder.  He has had numbness in his right pinky for short period of time.  His neck pain has been ongoing for 3 months.  Nothing really helps or makes it worse.  He has pain to his right trapezius muscle.  It is constant.  He has been doing home exercises without improvement.  He has tried some medications at home.  He has not had injections.  He also reports some issues with swallowing.  That has been bothering him. Bowel/Bladder Dysfunction: none  Conservative measures:  Physical therapy: has not participated in formal physical therapy, but he has been doing home exercises Multimodal medical therapy including regular antiinflammatories:  celebrex, tizanidine, meloxicam Injections:  has not received any epidural steroid injections  Past Surgery: Lumbar Surgery in February 2018 in [place] ([facility])  [name] has no symptoms of cervical myelopathy.  The symptoms are causing a significant impact on the patient's life.   Review of  Systems:  A 10 point review of systems is negative, except for the pertinent positives and negatives detailed in the HPI.  Past Medical History: Past Medical History:  Diagnosis Date   Asthma    Bipolar 1 disorder ([place])    Chronic back pain    DDD (degenerative disc disease), cervical    Depression    Diabetes mellitus without complication (HCC)    ED (erectile dysfunction)    Elevated lipids    GERD (gastroesophageal reflux disease)    Headache    Insomnia    Migraine    Seasonal allergies    Wheezing     Past Surgical History: Past Surgical History:  Procedure Laterality Date   CHOLECYSTECTOMY     COLONOSCOPY W/ BIOPSIES     FRACTURE SURGERY     tail bone   knee arthroscpy Right    SHOULDER ARTHROSCOPY WITH DISTAL CLAVICLE RESECTION Right 10/13/2017   Procedure: SHOULDER ARTHROSCOPY WITH DISTAL CLAVICLE RESECTION;  Surgeon: Corky Mull, MD;  Location: [facility];  Service: Orthopedics;  Laterality: Right;   SHOULDER ARTHROSCOPY WITH OPEN ROTATOR CUFF REPAIR Right 10/13/2017   Procedure: SHOULDER ARTHROSCOPY WITH OPEN ROTATOR CUFF REPAIR/;  Surgeon: Corky Mull, MD;  Location: [facility];  Service: Orthopedics;  Laterality: Right;   TOTAL KNEE ARTHROPLASTY Right 12/06/2019   Procedure: TOTAL KNEE ARTHROPLASTY;  Surgeon: Hessie Knows, MD;  Location: [facility];  Service: Orthopedics;  Laterality: Right;   TOTAL KNEE ARTHROPLASTY Right 05/14/2021  Procedure: REVISION TOTAL KNEE ARTHROPLASTY - [place] Neighbor to Assist;  Surgeon: Kennedy Bucker, MD;  Location: [facility];  Service: Orthopedics;  Laterality: Right;    Allergies: Allergies as of 11/04/2022 - Review Complete 11/04/2022  Allergen Reaction Noted   Doxycycline Nausea And Vomiting 03/03/2016   Ketorolac Itching 08/22/2018   Omega-3  05/23/2014   Other Other (See Comments) 05/23/2014   Parabens Other (See Comments) 05/23/2014   Tramadol Itching and Other (See Comments) 03/14/2015   Penicillins Rash, Other (See Comments),  and Hives 30-Jul-1971    Medications: Current Meds  Medication Sig   albuterol (PROVENTIL HFA;VENTOLIN HFA) 108 (90 Base) MCG/ACT inhaler Inhale 1 puff into the lungs every 4 (four) hours as needed for wheezing or shortness of breath.    albuterol (PROVENTIL) (2.5 MG/3ML) 0.083% nebulizer solution Take 2.5 mg by nebulization every 4 (four) hours as needed for wheezing.    atorvastatin (LIPITOR) 40 MG tablet Take by mouth daily.   budesonide (PULMICORT) 1 MG/2ML nebulizer solution INHALE 1 VIAL VIA NEBULIZER ONCE DAILY   celecoxib (CELEBREX) 200 MG capsule Take 1 capsule by mouth 2 (two) times daily.   Continuous Blood Gluc Sensor (FREESTYLE LIBRE 3 SENSOR) MISC As directedfor glucose monitoring   divalproex (DEPAKOTE ER) 250 MG 24 hr tablet Take 250 mg by mouth in the morning and at bedtime.   DULoxetine (CYMBALTA) 20 MG capsule Take 20 mg by mouth daily.   empagliflozin (JARDIANCE) 10 MG TABS tablet Take 10 mg by mouth daily.   emtricitabine-tenofovir AF (DESCOVY) 200-25 MG tablet Take 1 tablet by mouth daily.   FLUoxetine (PROZAC) 20 MG capsule Take 20 mg by mouth daily.   gabapentin (NEURONTIN) 300 MG capsule Take 600 mg by mouth 3 (three) times daily.    Galcanezumab-gnlm (EMGALITY) 120 MG/ML SOAJ Inject 120 mg into the skin every 30 (thirty) days.   Melatonin 3 MG TABS Take 3 mg by mouth at bedtime.    montelukast (SINGULAIR) 10 MG tablet Take 10 mg by mouth at bedtime.   Multiple Vitamin (MULTIVITAMIN WITH MINERALS) TABS tablet Take 1 tablet by mouth daily.   nortriptyline (PAMELOR) 50 MG capsule at bedtime. Take 40mg  nightly for one week, then increase to 50mg  nightly   pantoprazole (PROTONIX) 40 MG tablet Take 40 mg by mouth 2 (two) times daily.   prednisoLONE acetate (PRED FORTE) 1 % ophthalmic suspension Place 1 drop into the right eye 3 (three) times daily.   propranolol (INDERAL) 10 MG tablet Take 10 mg by mouth 2 (two) times daily.   pseudoephedrine-guaifenesin (MUCINEX D)  60-600 MG 12 hr tablet Take 1 tablet by mouth every 12 (twelve) hours.   rizatriptan (MAXALT) 10 MG tablet May take a second dose after 2 hours if needed.   tadalafil (CIALIS) 5 MG tablet Take 1 tablet (5 mg total) by mouth daily.   tiZANidine (ZANAFLEX) 4 MG tablet Take by mouth.   tiZANidine (ZANAFLEX) 4 MG tablet Take by mouth.   traZODone (DESYREL) 50 MG tablet Take 50 mg by mouth at bedtime.   zonisamide (ZONEGRAN) 100 MG capsule Take by mouth daily.    Social History: Social History   Tobacco Use   Smoking status: Former    Types: Cigarettes    Quit date: 11/28/2014    Years since quitting: 7.9   Smokeless tobacco: Never  Vaping Use   Vaping Use: Never used  Substance Use Topics   Alcohol use: No    Alcohol/week: 0.0 standard drinks of alcohol  Drug use: No    Family Medical History: Family History  Problem Relation Age of Onset   Diabetes Mother    Hypertension Mother    Stroke Father     Physical Examination: Vitals:   11/04/22 1312  BP: 110/60    General:          Patient is well developed, well nourished, calm, collected, and in no apparent distress. Attention to examination is appropriate.   Neck:               Supple.  Full range of motion.   Respiratory:     Patient is breathing without any difficulty.     NEUROLOGICAL:     Awake, alert, oriented to person, place, and time.  Speech is clear and fluent. Fund of knowledge is appropriate.    Cranial Nerves: Pupils equal round and reactive to light.  Facial tone is symmetric.  Facial sensation is symmetric. Shoulder shrug is symmetric. Tongue protrusion is midline.  There is no pronator drift.   ROM of spine: full.     Strength: Side Biceps Triceps Deltoid Interossei Grip Wrist Ext. Wrist Flex.  R 5 5 5 5 5 5 5   L 5 5 5 5 5 5 5     Side Iliopsoas Quads Hamstring PF DF EHL  R 5 5 5 5 5 5   L 5 5 5 5 5 5     Reflexes are 1+ and symmetric at the biceps, triceps, brachioradialis, patella and  achilles.   Hoffman's is absent.  Clonus is not present.  Toes are down-going.  Bilateral upper and lower extremity sensation is intact to light touch.    No evidence of dysmetria noted.   Gait is normal.    Medical Decision Making  Imaging: MRI C spine 06/13/22 IMPRESSION: 1. Moderate right neural foraminal narrowing at C3-4. 2. Severe right neural foraminal narrowing at C5-6. 3. No high-grade spinal canal stenosis.     Electronically Signed   By: Pedro Earls M.D.   On: 06/16/2022 10:56  I have personally reviewed the images and agree with the above interpretation.  Assessment and Plan: Mr. Battaglia is a pleasant 51 y.o. male with dysphagia.  This is not due to his C5-6 osteophyte.  He will see GI later in the year.  He has symptoms concerning for right C6 radiculopathy.  He has tried physical therapy without improvement.  At this point, I think it is reasonable to move forward with surgical intervention with a right-sided C5-6 posterior foraminotomy.  I emphasized to him that maintaining his sugar control is very important for wound healing.  I discussed the planned procedure at length with the patient, including the risks, benefits, alternatives, and indications. The risks discussed include but are not limited to bleeding, infection, need for reoperation, spinal fluid leak, stroke, vision loss, anesthetic complication, coma, paralysis, and even death. I also described in detail that improvement was not guaranteed.  The patient expressed understanding of these risks, and asked that we proceed with surgery. I described the surgery in layman's terms, and gave ample opportunity for questions, which were answered to the best of my ability.      Ples Trudel K. Izora Ribas MD, [facility] Neurosurgery

## 2022-11-11 ENCOUNTER — Other Ambulatory Visit: Payer: Self-pay

## 2022-11-11 ENCOUNTER — Encounter
Admission: RE | Admit: 2022-11-11 | Discharge: 2022-11-11 | Disposition: A | Discharge status: Home / Self Care | Payer: Medicare Other | Source: Ambulatory Visit | Attending: Neurosurgery | Admitting: Neurosurgery

## 2022-11-11 VITALS — BP 92/67 | HR 97 | Resp 16 | Ht 69.0 in | Wt 212.0 lb

## 2022-11-11 DIAGNOSIS — Z01818 Encounter for other preprocedural examination: Secondary | ICD-10-CM | POA: Diagnosis present

## 2022-11-11 DIAGNOSIS — E119 Type 2 diabetes mellitus without complications: Secondary | ICD-10-CM | POA: Diagnosis not present

## 2022-11-11 DIAGNOSIS — R030 Elevated blood-pressure reading, without diagnosis of hypertension: Secondary | ICD-10-CM

## 2022-11-11 DIAGNOSIS — M19011 Primary osteoarthritis, right shoulder: Secondary | ICD-10-CM

## 2022-11-11 LAB — TYPE AND SCREEN
ABO/RH(D): A POS
Antibody Screen: NEGATIVE

## 2022-11-11 LAB — SURGICAL PCR SCREEN
MRSA, PCR: NEGATIVE
Staphylococcus aureus: POSITIVE — AB

## 2022-11-11 NOTE — Patient Instructions (Signed)
Your procedure is scheduled on: 11/19/22 Report to DAY SURGERY DEPARTMENT LOCATED ON 2ND FLOOR MEDICAL MALL ENTRANCE. To find out your arrival time please call [phone] between 1PM - 3PM on 11/18/22.  Remember: Instructions that are not followed completely may result in serious medical risk, up to and including death, or upon the discretion of your surgeon and anesthesiologist your surgery may need to be rescheduled.     _X__ 1. Do not eat food after midnight the night before your procedure.                 No gum chewing or hard candies. You may drink clear liquids up to 2 hours                 before you are scheduled to arrive for your surgery- DO not drink clear                 liquids within 2 hours of the start of your surgery.                 Clear Liquids include:  water, apple juice without pulp, clear carbohydrate                 drink such as Clearfast or Gatorade, Black Coffee or Tea (Do not add                 anything to coffee or tea). Diabetics water only  __X__2.  On the morning of surgery brush your teeth with toothpaste and water, you                 may rinse your mouth with mouthwash if you wish.  Do not swallow any              toothpaste of mouthwash.     _X__ 3.  No Alcohol for 24 hours before or after surgery.   _X__ 4.  Do Not Smoke or use e-cigarettes For 24 Hours Prior to Your Surgery.                 Do not use any chewable tobacco products for at least 6 hours prior to                 surgery.  ____  5.  Bring all medications with you on the day of surgery if instructed.   __X__  6.  Notify your doctor if there is any change in your medical condition      (cold, fever, infections).     Do not wear jewelry, make-up, hairpins, clips or nail polish. Do not wear lotions, powders, or perfumes. No deodoarnt Do not shave body hair 48 hours prior to surgery. Men may shave face and neck. Do not bring valuables to the hospital.    [facility] is not  responsible for any belongings or valuables.  Contacts, dentures/partials or body piercings may not be worn into surgery. Bring a case for your contacts, glasses or hearing aids, a denture cup will be supplied. Leave your suitcase in the car. After surgery it may be brought to your room. For patients admitted to the hospital, discharge time is determined by your treatment team.   Patients discharged the day of surgery will not be allowed to drive home.   Please read over the following fact sheets that you were given:   MRSA Information, CHG soap  __X__ Take these medicines the morning of surgery with A  SIP OF WATER:    1. divalproex (DEPAKOTE ER) 250 MG 24 hr tablet   2. emtricitabine-tenofovir AF (DESCOVY) 200-25 MG tablet   3. FLUoxetine (PROZAC) 20 MG capsule   4. gabapentin (NEURONTIN) 600 MG capsule   5. pantoprazole (PROTONIX) 40 MG tablet   6. propranolol (INDERAL) 10 MG tablet   7. zonisamide (ZONEGRAN) 100 MG capsule   ____ Fleet Enema (as directed)   __X__ Use CHG Soap/SAGE wipes as directed  __X__ Use inhalers on the day of surgery  DO A NEBULIZER TREATMENT BEFORE ARRIVING  __X__ Stop metformin/Janumet/Farxiga 2 days prior to surgery HOLD JARDIANCE FOR 3 DOSES PRIOR TO SURGERY (LAST DOSE 12/10)   ____ Take 1/2 of usual insulin dose the night before surgery. No insulin the morning          of surgery.   ____ Stop Blood Thinners Coumadin/Plavix/Xarelto/Pleta/Pradaxa/Eliquis/Effient/Aspirin  on   Or contact your Surgeon, Cardiologist or Medical Doctor regarding  ability to stop your blood thinners  __X__ Stop Anti-inflammatories 7 days before surgery such as Advil, Ibuprofen, Motrin,  BC or Goodies Powder, Naprosyn, Naproxen, Aleve, Aspirin    __X__ Stop all herbals and  supplements, fish oil or vitamins  until after surgery.    ____ Bring C-Pap to the hospital.

## 2022-11-12 ENCOUNTER — Inpatient Hospital Stay: Admission: RE | Admit: 2022-11-12 | Payer: Medicare Other | Source: Ambulatory Visit

## 2022-11-15 ENCOUNTER — Encounter: Payer: Self-pay | Admitting: Neurosurgery

## 2022-11-18 ENCOUNTER — Encounter (INDEPENDENT_AMBULATORY_CARE_PROVIDER_SITE_OTHER): Payer: Self-pay

## 2022-11-19 ENCOUNTER — Ambulatory Visit: Payer: Medicare Other

## 2022-11-19 ENCOUNTER — Ambulatory Visit: Payer: Medicare Other | Admitting: Urgent Care

## 2022-11-19 ENCOUNTER — Ambulatory Visit: Payer: Medicare Other | Admitting: General Practice

## 2022-11-19 ENCOUNTER — Other Ambulatory Visit: Payer: Self-pay

## 2022-11-19 ENCOUNTER — Ambulatory Visit
Admission: RE | Admit: 2022-11-19 | Discharge: 2022-11-19 | Disposition: A | Discharge status: Home / Self Care | Payer: Medicare Other | Attending: Neurosurgery | Admitting: Neurosurgery

## 2022-11-19 ENCOUNTER — Encounter
Admission: RE | Disposition: A | Discharge status: Home / Self Care | Payer: Self-pay | Source: Home / Self Care | Attending: Neurosurgery

## 2022-11-19 ENCOUNTER — Encounter: Payer: Self-pay | Admitting: Neurosurgery

## 2022-11-19 DIAGNOSIS — F319 Bipolar disorder, unspecified: Secondary | ICD-10-CM | POA: Insufficient documentation

## 2022-11-19 DIAGNOSIS — Z01818 Encounter for other preprocedural examination: Secondary | ICD-10-CM

## 2022-11-19 DIAGNOSIS — M2578 Osteophyte, vertebrae: Secondary | ICD-10-CM | POA: Diagnosis not present

## 2022-11-19 DIAGNOSIS — J4520 Mild intermittent asthma, uncomplicated: Secondary | ICD-10-CM | POA: Diagnosis not present

## 2022-11-19 DIAGNOSIS — M5412 Radiculopathy, cervical region: Secondary | ICD-10-CM | POA: Insufficient documentation

## 2022-11-19 DIAGNOSIS — E119 Type 2 diabetes mellitus without complications: Secondary | ICD-10-CM | POA: Diagnosis not present

## 2022-11-19 DIAGNOSIS — M4802 Spinal stenosis, cervical region: Secondary | ICD-10-CM | POA: Insufficient documentation

## 2022-11-19 DIAGNOSIS — K219 Gastro-esophageal reflux disease without esophagitis: Secondary | ICD-10-CM | POA: Diagnosis not present

## 2022-11-19 DIAGNOSIS — Z87891 Personal history of nicotine dependence: Secondary | ICD-10-CM | POA: Diagnosis not present

## 2022-11-19 HISTORY — PX: POSTERIOR CERVICAL LAMINECTOMY: SHX2248

## 2022-11-19 LAB — GLUCOSE, CAPILLARY
Glucose-Capillary: 150 mg/dL — ABNORMAL HIGH (ref 70–99)
Glucose-Capillary: 152 mg/dL — ABNORMAL HIGH (ref 70–99)

## 2022-11-19 SURGERY — POSTERIOR CERVICAL LAMINECTOMY
Anesthesia: General | Site: Spine Cervical | Laterality: Right

## 2022-11-19 MED ORDER — GLYCOPYRROLATE 0.2 MG/ML IJ SOLN
INTRAMUSCULAR | Status: DC | PRN
  Administered 2022-11-19: .1 mg via INTRAVENOUS

## 2022-11-19 MED ORDER — SODIUM CHLORIDE 0.9 % IV SOLN: INTRAVENOUS | Status: DC

## 2022-11-19 MED ORDER — SURGIFLO WITH THROMBIN (HEMOSTATIC MATRIX KIT) OPTIME
TOPICAL | Status: DC | PRN
  Administered 2022-11-19: 1 via TOPICAL

## 2022-11-19 MED ORDER — OXYCODONE HCL 5 MG PO TABS
5.0000 mg | ORAL_TABLET | Freq: Once | ORAL | Status: AC | PRN
  Administered 2022-11-19: 5 mg via ORAL

## 2022-11-19 MED ORDER — LIDOCAINE HCL (PF) 2 % IJ SOLN
INTRAMUSCULAR | Status: AC
  Filled 2022-11-19: qty 5

## 2022-11-19 MED ORDER — SODIUM CHLORIDE (PF) 0.9 % IJ SOLN
INTRAMUSCULAR | Status: DC | PRN
  Administered 2022-11-19: 60 mL

## 2022-11-19 MED ORDER — ONDANSETRON HCL 4 MG/2ML IJ SOLN
INTRAMUSCULAR | Status: AC
  Filled 2022-11-19: qty 2

## 2022-11-19 MED ORDER — PROPOFOL 10 MG/ML IV BOLUS
INTRAVENOUS | Status: AC
  Filled 2022-11-19: qty 20

## 2022-11-19 MED ORDER — BUPIVACAINE LIPOSOME 1.3 % IJ SUSP
INTRAMUSCULAR | Status: AC
  Filled 2022-11-19: qty 20

## 2022-11-19 MED ORDER — MIDAZOLAM HCL 2 MG/2ML IJ SOLN
INTRAMUSCULAR | Status: DC | PRN
  Administered 2022-11-19: 2 mg via INTRAVENOUS

## 2022-11-19 MED ORDER — CHLORHEXIDINE GLUCONATE 0.12 % MT SOLN
15.0000 mL | Freq: Once | OROMUCOSAL | Status: AC
  Administered 2022-11-19: 15 mL via OROMUCOSAL

## 2022-11-19 MED ORDER — PHENYLEPHRINE HCL (PRESSORS) 10 MG/ML IV SOLN
INTRAVENOUS | Status: DC | PRN
  Administered 2022-11-19 (×2): 80 ug via INTRAVENOUS

## 2022-11-19 MED ORDER — BUPIVACAINE HCL (PF) 0.5 % IJ SOLN
INTRAMUSCULAR | Status: AC
  Filled 2022-11-19: qty 30

## 2022-11-19 MED ORDER — PHENYLEPHRINE 80 MCG/ML (10ML) SYRINGE FOR IV PUSH (FOR BLOOD PRESSURE SUPPORT)
PREFILLED_SYRINGE | INTRAVENOUS | Status: AC
  Filled 2022-11-19: qty 10

## 2022-11-19 MED ORDER — DEXAMETHASONE SODIUM PHOSPHATE 10 MG/ML IJ SOLN
INTRAMUSCULAR | Status: AC
  Filled 2022-11-19: qty 1

## 2022-11-19 MED ORDER — SODIUM CHLORIDE FLUSH 0.9 % IV SOLN
INTRAVENOUS | Status: AC
  Filled 2022-11-19: qty 20

## 2022-11-19 MED ORDER — GLYCOPYRROLATE 0.2 MG/ML IJ SOLN
INTRAMUSCULAR | Status: AC
  Filled 2022-11-19: qty 1

## 2022-11-19 MED ORDER — OXYCODONE HCL 5 MG/5ML PO SOLN: 5.0000 mg | Freq: Once | ORAL | Status: AC | PRN

## 2022-11-19 MED ORDER — BUPIVACAINE-EPINEPHRINE (PF) 0.5% -1:200000 IJ SOLN
INTRAMUSCULAR | Status: AC
  Filled 2022-11-19: qty 30

## 2022-11-19 MED ORDER — SUCCINYLCHOLINE CHLORIDE 200 MG/10ML IV SOSY
PREFILLED_SYRINGE | INTRAVENOUS | Status: AC
  Filled 2022-11-19: qty 10

## 2022-11-19 MED ORDER — SENNA 8.6 MG PO TABS: 1.0000 | ORAL_TABLET | Freq: Every day | ORAL | 0 refills | Status: DC | PRN

## 2022-11-19 MED ORDER — METHYLPREDNISOLONE ACETATE 40 MG/ML IJ SUSP
INTRAMUSCULAR | Status: DC | PRN
  Administered 2022-11-19: 40 mg

## 2022-11-19 MED ORDER — 0.9 % SODIUM CHLORIDE (POUR BTL) OPTIME
TOPICAL | Status: DC | PRN
  Administered 2022-11-19: 500 mL

## 2022-11-19 MED ORDER — ACETAMINOPHEN 10 MG/ML IV SOLN
INTRAVENOUS | Status: DC | PRN
  Administered 2022-11-19: 1000 mg via INTRAVENOUS

## 2022-11-19 MED ORDER — CHLORHEXIDINE GLUCONATE 0.12 % MT SOLN
OROMUCOSAL | Status: AC
  Filled 2022-11-19: qty 15

## 2022-11-19 MED ORDER — PHENYLEPHRINE HCL-NACL 20-0.9 MG/250ML-% IV SOLN
INTRAVENOUS | Status: DC | PRN
  Administered 2022-11-19: 53.333 ug/min via INTRAVENOUS

## 2022-11-19 MED ORDER — MIDAZOLAM HCL 2 MG/2ML IJ SOLN
INTRAMUSCULAR | Status: AC
  Filled 2022-11-19: qty 2

## 2022-11-19 MED ORDER — BUPIVACAINE-EPINEPHRINE (PF) 0.5% -1:200000 IJ SOLN
INTRAMUSCULAR | Status: DC | PRN
  Administered 2022-11-19: 3 mL

## 2022-11-19 MED ORDER — ACETAMINOPHEN 10 MG/ML IV SOLN
INTRAVENOUS | Status: AC
  Filled 2022-11-19: qty 100

## 2022-11-19 MED ORDER — FENTANYL CITRATE (PF) 100 MCG/2ML IJ SOLN
INTRAMUSCULAR | Status: AC
  Filled 2022-11-19: qty 2

## 2022-11-19 MED ORDER — FENTANYL CITRATE (PF) 100 MCG/2ML IJ SOLN: 25.0000 ug | INTRAMUSCULAR | Status: DC | PRN

## 2022-11-19 MED ORDER — KETAMINE HCL 50 MG/5ML IJ SOSY
PREFILLED_SYRINGE | INTRAMUSCULAR | Status: AC
  Filled 2022-11-19: qty 5

## 2022-11-19 MED ORDER — PROPOFOL 10 MG/ML IV BOLUS
INTRAVENOUS | Status: DC | PRN
  Administered 2022-11-19: 150 mg via INTRAVENOUS
  Administered 2022-11-19: 50 mg via INTRAVENOUS

## 2022-11-19 MED ORDER — OXYCODONE HCL 5 MG PO TABS
ORAL_TABLET | ORAL | Status: AC
  Filled 2022-11-19: qty 1

## 2022-11-19 MED ORDER — PHENYLEPHRINE HCL-NACL 20-0.9 MG/250ML-% IV SOLN
INTRAVENOUS | Status: AC
  Filled 2022-11-19: qty 250

## 2022-11-19 MED ORDER — ORAL CARE MOUTH RINSE: 15.0000 mL | Freq: Once | OROMUCOSAL | Status: AC

## 2022-11-19 MED ORDER — FENTANYL CITRATE (PF) 100 MCG/2ML IJ SOLN
INTRAMUSCULAR | Status: DC | PRN
  Administered 2022-11-19 (×2): 50 ug via INTRAVENOUS

## 2022-11-19 MED ORDER — CEFAZOLIN SODIUM-DEXTROSE 2-4 GM/100ML-% IV SOLN
INTRAVENOUS | Status: AC
  Filled 2022-11-19: qty 100

## 2022-11-19 MED ORDER — LIDOCAINE HCL (CARDIAC) PF 100 MG/5ML IV SOSY
PREFILLED_SYRINGE | INTRAVENOUS | Status: DC | PRN
  Administered 2022-11-19: 100 mg via INTRAVENOUS

## 2022-11-19 MED ORDER — METHYLPREDNISOLONE ACETATE 40 MG/ML IJ SUSP
INTRAMUSCULAR | Status: AC
  Filled 2022-11-19: qty 1

## 2022-11-19 MED ORDER — TIZANIDINE HCL 4 MG PO TABS: 4.0000 mg | ORAL_TABLET | Freq: Three times a day (TID) | ORAL | 0 refills | Status: DC

## 2022-11-19 MED ORDER — KETAMINE HCL 10 MG/ML IJ SOLN
INTRAMUSCULAR | Status: DC | PRN
  Administered 2022-11-19: 10 mg via INTRAVENOUS
  Administered 2022-11-19: 30 mg via INTRAVENOUS
  Administered 2022-11-19: 10 mg via INTRAVENOUS

## 2022-11-19 MED ORDER — OXYCODONE-ACETAMINOPHEN 5-325 MG PO TABS: 1.0000 | ORAL_TABLET | ORAL | 0 refills | Status: AC | PRN

## 2022-11-19 MED ORDER — ONDANSETRON HCL 4 MG/2ML IJ SOLN
INTRAMUSCULAR | Status: DC | PRN
  Administered 2022-11-19: 4 mg via INTRAVENOUS

## 2022-11-19 MED ORDER — DEXAMETHASONE SODIUM PHOSPHATE 10 MG/ML IJ SOLN
INTRAMUSCULAR | Status: DC | PRN
  Administered 2022-11-19: 10 mg via INTRAVENOUS

## 2022-11-19 MED ORDER — CEFAZOLIN SODIUM-DEXTROSE 2-4 GM/100ML-% IV SOLN
2.0000 g | Freq: Once | INTRAVENOUS | Status: AC
  Administered 2022-11-19: 2 g via INTRAVENOUS

## 2022-11-19 MED ORDER — SUCCINYLCHOLINE CHLORIDE 200 MG/10ML IV SOSY
PREFILLED_SYRINGE | INTRAVENOUS | Status: DC | PRN
  Administered 2022-11-19: 100 mg via INTRAVENOUS

## 2022-11-19 SURGICAL SUPPLY — 64 items
BASIN KIT SINGLE STR (MISCELLANEOUS) ×1 IMPLANT
BLADE SURG 15 STRL LF DISP TIS (BLADE) ×1 IMPLANT
BLADE SURG 15 STRL SS (BLADE) ×1
BULB RESERV EVAC DRAIN JP 100C (MISCELLANEOUS) IMPLANT
BUR NEURO DRILL SOFT 3.0X3.8M (BURR) ×1 IMPLANT
CHLORAPREP W/TINT 26 (MISCELLANEOUS) ×1 IMPLANT
COUNTER NEEDLE 20/40 LG (NEEDLE) ×1 IMPLANT
CUP MEDICINE 2OZ PLAST GRAD ST (MISCELLANEOUS) ×2 IMPLANT
DERMABOND ADVANCED .7 DNX12 (GAUZE/BANDAGES/DRESSINGS) ×1 IMPLANT
DRAIN CHANNEL JP 10F RND 20C F (MISCELLANEOUS) IMPLANT
DRAPE C ARM PK CFD 31 SPINE (DRAPES) ×1 IMPLANT
DRAPE LAPAROTOMY 100X77 ABD (DRAPES) ×1 IMPLANT
DRAPE MICROSCOPE SPINE 48X150 (DRAPES) IMPLANT
DRAPE SURG 17X11 SM STRL (DRAPES) ×1 IMPLANT
DRSG OPSITE POSTOP 4X6 (GAUZE/BANDAGES/DRESSINGS) IMPLANT
ELECT CAUTERY BLADE TIP 2.5 (TIP) ×1
ELECTRODE CAUTERY BLDE TIP 2.5 (TIP) ×1 IMPLANT
FEE INTRAOP CADWELL SUPPLY NCS (MISCELLANEOUS) IMPLANT
FEE INTRAOP MONITOR IMPULS NCS (MISCELLANEOUS) IMPLANT
GAUZE SPONGE 4X4 12PLY STRL (GAUZE/BANDAGES/DRESSINGS) ×1 IMPLANT
GLOVE BIOGEL PI IND STRL 6.5 (GLOVE) ×1 IMPLANT
GLOVE BIOGEL PI IND STRL 8.5 (GLOVE) ×1 IMPLANT
GLOVE SURG SYN 6.5 ES PF (GLOVE) ×1 IMPLANT
GLOVE SURG SYN 6.5 PF PI (GLOVE) ×1 IMPLANT
GLOVE SURG SYN 8.5  E (GLOVE) ×3
GLOVE SURG SYN 8.5 E (GLOVE) ×3 IMPLANT
GLOVE SURG SYN 8.5 PF PI (GLOVE) ×3 IMPLANT
GOWN SRG LRG LVL 4 IMPRV REINF (GOWNS) ×1 IMPLANT
GOWN SRG XL LVL 3 NONREINFORCE (GOWNS) ×1 IMPLANT
GOWN STRL NON-REIN TWL XL LVL3 (GOWNS) ×1
GOWN STRL REIN LRG LVL4 (GOWNS) ×1
GRADUATE 1200CC STRL 31836 (MISCELLANEOUS) ×1 IMPLANT
HOLDER FOLEY CATH W/STRAP (MISCELLANEOUS) ×1 IMPLANT
INTRAOP CADWELL SUPPLY FEE NCS (MISCELLANEOUS)
INTRAOP DISP SUPPLY FEE NCS (MISCELLANEOUS)
INTRAOP MONITOR FEE IMPULS NCS (MISCELLANEOUS)
INTRAOP MONITOR FEE IMPULSE (MISCELLANEOUS)
KIT PREVENA INCISION MGT 13 (CANNISTER) ×1 IMPLANT
KIT SPINAL PRONEVIEW (KITS) IMPLANT
KIT TURNOVER KIT A (KITS) ×1 IMPLANT
MANIFOLD NEPTUNE II (INSTRUMENTS) ×1 IMPLANT
MARKER SKIN DUAL TIP RULER LAB (MISCELLANEOUS) ×2 IMPLANT
NDL SPNL 18GX3.5 QUINCKE PK (NEEDLE) ×3 IMPLANT
NEEDLE SPNL 18GX3.5 QUINCKE PK (NEEDLE) IMPLANT
PACK LAMINECTOMY NEURO (CUSTOM PROCEDURE TRAY) ×1 IMPLANT
PAD ARMBOARD 7.5X6 YLW CONV (MISCELLANEOUS) ×2 IMPLANT
PIN MAYFIELD SKULL DISP (PIN) ×1 IMPLANT
SLEEVE PROTECTION STRL DISP (MISCELLANEOUS) IMPLANT
SOLUTION IRRIG SURGIPHOR (IV SOLUTION) ×1 IMPLANT
STAPLER SKIN PROX 35W (STAPLE) ×2 IMPLANT
SURGIFLO W/THROMBIN 8M KIT (HEMOSTASIS) ×1 IMPLANT
SUT DVC VLOC 3-0 CL 6 P-12 (SUTURE) IMPLANT
SUT ETHILON 3-0 FS-10 30 BLK (SUTURE)
SUT V-LOC 90 ABS DVC 3-0 CL (SUTURE) ×1 IMPLANT
SUT VIC AB 0 CT1 27 (SUTURE) ×1
SUT VIC AB 0 CT1 27XCR 8 STRN (SUTURE) ×3 IMPLANT
SUT VIC AB 2-0 CT1 18 (SUTURE) ×2 IMPLANT
SUTURE EHLN 3-0 FS-10 30 BLK (SUTURE) IMPLANT
SYR 30ML LL (SYRINGE) ×3 IMPLANT
TAPE CLOTH 3X10 WHT NS LF (GAUZE/BANDAGES/DRESSINGS) ×2 IMPLANT
TOWEL OR 17X26 4PK STRL BLUE (TOWEL DISPOSABLE) ×4 IMPLANT
TRAP FLUID SMOKE EVACUATOR (MISCELLANEOUS) ×1 IMPLANT
TRAY FOLEY MTR SLVR 16FR STAT (SET/KITS/TRAYS/PACK) IMPLANT
TUBING CONNECTING 10 (TUBING) ×1 IMPLANT

## 2022-11-19 NOTE — Discharge Instructions (Addendum)
Your surgeon has performed an operation on your cervical spine (neck) to relieve pressure on the spinal cord and/or nerves. This involved making an incision in the back of your neck and removing bone from your spine.   The following are instructions to help in your recovery once you have been discharged from the hospital. Even if you feel well, it is important that you follow these activity guidelines. If you do not let your neck heal properly from the surgery, you can increase the chance of return of your symptoms and other complications.  Activity    No bending, lifting, or twisting ("BLT"). Avoid lifting objects heavier than 10 pounds (gallon milk jug).  Where possible, avoid household activities that involve lifting, bending, reaching, pushing, or pulling such as laundry, vacuuming, grocery shopping, and childcare. Try to arrange for help from friends and family for these activities while your back heals.  Increase physical activity slowly as tolerated.  Taking short walks is encouraged, but avoid strenuous exercise. Do not jog, run, bicycle, lift weights, or participate in any other exercises unless specifically allowed by your doctor.  Talk to your doctor before resuming sexual activity.  You should not drive until cleared by your doctor.  Until released by your doctor, you should not return to work or school.  You should rest at home and let your body heal.   You may shower three days after your surgery.  After showering, lightly dab your incision dry. Do not take a tub bath or go swimming until approved by your doctor at your follow-up appointment.  If you smoke, we strongly recommend that you quit.  Smoking has been proven to interfere with normal bone healing and will dramatically reduce the success rate of your surgery. Please contact [facility] (800-QUIT-NOW) and use the resources at www.[facility].com for assistance in stopping smoking.  Surgical Incision   If you have a dressing  on your incision, you may remove it two days after your surgery. Keep your incision area clean and dry.  Your incision was closed with Dermabond glue. The glue should begin to peel away within about a week. Diet           You may return to your usual diet. However, you may experience discomfort when swallowing in the first month after your surgery. This is normal. You may find that softer foods are more comfortable for you to swallow. Be sure to stay hydrated.  When to Contact us  You may experience pain in your neck and/or pain between your shoulder blades. This is normal and should improve in the next few weeks with the help of pain medication, muscle relaxers, and rest. Some patients report that a warm compress on the back of the neck or between the shoulder blades helps.  However, should you experience any of the following, contact us immediately: New numbness or weakness Pain that is progressively getting worse, and is not relieved by your pain medication, muscle relaxers, rest, and warm compresses Bleeding, redness, swelling, pain, or drainage from surgical incision Chills or flu-like symptoms Fever greater than 101.0 F (38.3 C) Inability to eat, drink fluids, or take medications Problems with bowel or bladder functions Difficulty breathing or shortness of breath Warmth, tenderness, or swelling in your calf Contact Information During office hours (Monday-Friday 9 am to 5 pm), please call your physician at [phone] and ask for Sharlot Gowda After hours and weekends, please call [phone] and speak with the neurosurgeon on call For a life-threatening emergency,  call 911   DO NOT REMOVE GREEN TEAL ARMBAND FOR 4 DAYS  AMBULATORY SURGERY  DISCHARGE INSTRUCTIONS   The drugs that you were given will stay in your system until tomorrow so for the next 24 hours you should not:  Drive an automobile Make any legal decisions Drink any alcoholic beverage   You may resume  regular meals tomorrow.  Today it is better to start with liquids and gradually work up to solid foods.  You may eat anything you prefer, but it is better to start with liquids, then soup and crackers, and gradually work up to solid foods.   Please notify your doctor immediately if you have any unusual bleeding, trouble breathing, redness and pain at the surgery site, drainage, fever, or pain not relieved by medication.    Additional Instructions:  Please contact your physician with any problems or Same Day Surgery at [phone], Monday through Friday 6 am to 4 pm, or [place] at [facility] number at [phone].

## 2022-11-19 NOTE — Anesthesia Preprocedure Evaluation (Signed)
Anesthesia Evaluation  Patient identified by MRN, date of birth, ID band Patient awake    Reviewed: Allergy & Precautions, NPO status , Patient's Chart, lab work & pertinent test results  History of Anesthesia Complications Negative for: history of anesthetic complications  Airway Mallampati: III  TM Distance: >3 FB Neck ROM: Full    Dental no notable dental hx. (+) Dental Advidsory Given, Missing   Pulmonary neg shortness of breath, asthma (mild intermittent) , neg sleep apnea, former smoker   breath sounds clear to auscultation- rhonchi (-) wheezing      Cardiovascular Exercise Tolerance: Good (-) hypertension(-) CAD, (-) Past MI, (-) Cardiac Stents and (-) CABG  Rhythm:Regular Rate:Normal - Systolic murmurs and - Diastolic murmurs    Neuro/Psych  Headaches, neg Seizures PSYCHIATRIC DISORDERS  Depression Bipolar Disorder      GI/Hepatic Neg liver ROS,GERD  ,,  Endo/Other  diabetes    Renal/GU negative Renal ROS     Musculoskeletal  (+) Arthritis ,    Abdominal  (+) - obese  Peds  Hematology negative hematology ROS (+)   Anesthesia Other Findings Past Medical History: No date: Asthma No date: Bipolar 1 disorder (HCC) No date: Chronic back pain No date: DDD (degenerative disc disease), cervical No date: Depression No date: Diabetes mellitus without complication (HCC) No date: ED (erectile dysfunction) No date: Elevated lipids No date: GERD (gastroesophageal reflux disease) No date: Headache No date: Insomnia No date: Migraine No date: Seasonal allergies No date: Wheezing   Reproductive/Obstetrics                             Lab Results  Component Value Date   WBC 4.9 09/30/2022   HGB 13.6 09/30/2022   HCT 42.0 09/30/2022   MCV 82 09/30/2022   PLT 240 09/30/2022    Anesthesia Physical Anesthesia Plan  ASA: 2  Anesthesia Plan: General   Post-op Pain Management:     Induction: Intravenous  PONV Risk Score and Plan: 2 and Propofol infusion, TIVA, Dexamethasone, Ondansetron and Midazolam  Airway Management Planned: Oral ETT  Additional Equipment:   Intra-op Plan:   Post-operative Plan: Extubation in OR  Informed Consent: I have reviewed the patients History and Physical, chart, labs and discussed the procedure including the risks, benefits and alternatives for the proposed anesthesia with the patient or authorized representative who has indicated his/her understanding and acceptance.     Dental Advisory Given  Plan Discussed with: Anesthesiologist, CRNA and Surgeon  Anesthesia Plan Comments: (Patient consented for risks of anesthesia including but not limited to:  - adverse reactions to medications - damage to eyes, teeth, lips or other oral mucosa - nerve damage due to positioning  - sore throat or hoarseness - Damage to heart, brain, nerves, lungs, other parts of body or loss of life  Patient voiced understanding.)        Anesthesia Quick Evaluation

## 2022-11-19 NOTE — Op Note (Signed)
Indications: Mr. Stave is a 51 year old gentleman who suffers from cervical radiculopathy.  He underwent conservative management and failed.  Due to failure of conservative management, surgery was recommended.  Findings: Neuroforaminal stenosis  Preoperative Diagnosis: Cervical radiculopathy Postoperative Diagnosis: same   EBL: 25 ml IVF: see AR ml Drains: none Disposition: Extubated and Stable to PACU Complications: none  No foley catheter was placed.   Preoperative Note:   Risks of surgery discussed include: infection, bleeding, stroke, coma, death, paralysis, CSF leak, nerve/spinal cord injury, numbness, tingling, weakness, complex regional pain syndrome, recurrent stenosis and/or disc herniation, vascular injury, development of instability, neck/back pain, need for further surgery, persistent symptoms, development of deformity, and the risks of anesthesia. The patient understood these risks and agreed to proceed.  Operative Note:   OPERATIVE PROCEDURE:  1. Posterior foraminotomies on the right at C5-6   OPERATIVE PROCEDURE:  After induction of general anesthesia, the patient was placed in the prone position.   A midline incision was then planned using fluoroscopy.  A timeout was performed, and antibiotics given.  Next, the posterior cervical region was prepped and draped in the usual sterile fashion. The incision was injected with local anesthetic, the opened sharply.  The fascia was opened on the right and the C5 and C6 posterior elements exposed.  An x-ray was taken to confirm localization.    After localizing, the microscope was brought in.  Using a high-speed drill, a laminoforaminotomy was begun by drilling the right side at the C5 lamina and removing the medial aspect of the right C5-6 facet joint to start the foraminotomies.  The right C6 nerve root was identified.  An upgoing curette was used to establish the epidural plane.  Using 1 mm Kerrison punch, the foramen was  unroofed until the right C6 nerve root was decompressed.  It was palpated with a nerve hook to confirm adequate decompression.   After decompression was complete, the wound was copiously irrigated with bacitracin-containing solution and hemostasis was achieved.    The wound was closed in a multilayer fashion using interrupted 0 and 2-0 Vicryl sutures.  The final skin edges were reapproximated using a 3-0 monocryl.  Dermabond was placed on the wound for skin closure.  After closure, the patient was flipped supine. Patient was then handed back over to anesthesia.  All counts were correct at the conclusion of the procedure.  Neurological monitoring was used throughout, and there were no changes.  Manning Charity PA acted as an Designer, television/film set throughout the case. An assistant was required for this procedure due to the complexity.  The assistant provided assistance in tissue manipulation and suction, and was required for the successful and safe performance of the procedure. I performed the critical portions of the procedure.   Venetia Night MD

## 2022-11-19 NOTE — Discharge Summary (Signed)
Discharge Summary  Patient ID: Daniel [name] MRN: [id number] DOB/AGE: 1971/07/14 51 y.o.  Admit date: 11/19/2022 Discharge date: 11/19/2022  Admission Diagnoses: Cervical radiculopathy   Discharge Diagnoses:  Active Problems:   Cervical radiculopathy   Discharged Condition: good  Hospital Course:  Daniel [name] is a 51 y.o presenting with cervical radiculopathy s/p right C5-6 foraminotomy. His intraoperative course was uncomplicated and he was discharged home on POD0 after ambulating, urinating, and tolerating PO intake.   Consults: None  Significant Diagnostic Studies: none  Treatments: surgery: as above. Please see separately dictated operative report for further details.   Discharge Exam: Blood pressure (!) 146/84, pulse 94, temperature (!) 97.2 F (36.2 C), temperature source Temporal, resp. rate 16, height 5\' 9"  (1.753 m), weight 96.2 kg, SpO2 98 %. CN II-XII grossly intact 5/5 throughout BLE  Incision c/d/I with post-op dressing in place  Disposition: Discharge disposition: 01-Home or Self Care        Allergies as of 11/19/2022       Reactions   Doxycycline Nausea And Vomiting   Other reaction(s): Vomiting   Ketorolac Itching   Other reaction(s): Unknown   Omega-3    Other reaction(s): Other (See Comments) Other reaction(s): Unknown Mercaptobenzothiazole   Other Other (See Comments)   Confirmed by allergy testing Mercaptobenzothiazole Other reaction(s): Unknown Other reaction(s): Other (See Comments) Mercaptobenzothiazole Mercaptobenzothiazole   Parabens Other (See Comments)   Confirmed by allergy testing Other reaction(s): Unknown Other reaction(s): Other (See Comments) Other reaction(s): Unknown   Tramadol Itching, Other (See Comments)   Tolerates extended release Other reaction(s): Other (See Comments) Other reaction(s): Other (see comments), Other (See Comments) Tolerates extended release Tolerates extended release Tolerates extended  release Tolerates extended release Tolerates extended release Tolerates extended release   Penicillins Rash, Other (See Comments), Hives   Has patient had a PCN reaction causing immediate rash, facial/tongue/throat swelling, SOB or lightheadedness with hypotension: Yes Has patient had a PCN reaction causing severe rash involving mucus membranes or skin necrosis: No Has patient had a PCN reaction that required hospitalization No Has patient had a PCN reaction occurring within the last 10 years: No If all of the above answers are "NO", then may proceed with Cephalosporin use. Other reaction(s): Other Other reaction(s): Other (See Comments) Has patient had a PCN reaction causing immediate rash, facial/tongue/throat swelling, SOB or lightheadedness with hypotension: Yes Has patient had a PCN reaction causing severe rash involving mucus membranes or skin necrosis: No Has patient had a PCN reaction that required hospitalization No Has patient had a PCN reaction occurring within the last 10 years: No If all of the above answers are "NO", then may proceed with Cephalosporin use. Can take keflexCan take keflex Has patient had a PCN reaction causing immediate rash, facial/tongue/throat swelling, SOB or lightheadedness with hypotension: Yes Has patient had a PCN reaction causing severe rash involving mucus membranes or skin necrosis: No Has patient had a PCN reaction that required hospitalization No Has patient had a PCN reaction occurring within the last 10 years: No If all of the above answers are "NO", then may proceed with Cephalosporin use. Other reaction(s): Unknown        Medication List     TAKE these medications    albuterol 108 (90 Base) MCG/ACT inhaler Commonly known as: VENTOLIN HFA Inhale 1 puff into the lungs every 4 (four) hours as needed for wheezing or shortness of breath.   albuterol (2.5 MG/3ML) 0.083% nebulizer solution Commonly known as: PROVENTIL Take 2.5 mg  by  nebulization every 4 (four) hours as needed for wheezing.   atorvastatin 40 MG tablet Commonly known as: LIPITOR Take 40 mg by mouth at bedtime.   budesonide 1 MG/2ML nebulizer solution Commonly known as: PULMICORT INHALE 1 VIAL VIA NEBULIZER ONCE DAILY   celecoxib 200 MG capsule Commonly known as: CELEBREX Take 1 capsule by mouth 2 (two) times daily.   Descovy 200-25 MG tablet Generic drug: emtricitabine-tenofovir AF Take 1 tablet by mouth daily.   divalproex 250 MG 24 hr tablet Commonly known as: DEPAKOTE ER Take 250 mg by mouth in the morning and at bedtime.   DULoxetine 20 MG capsule Commonly known as: CYMBALTA Take 20 mg by mouth at bedtime.   Emgality 120 MG/ML Soaj Generic drug: Galcanezumab-gnlm Inject 120 mg into the skin every 30 (thirty) days.   FLUoxetine 20 MG capsule Commonly known as: PROZAC Take 20 mg by mouth daily.   FreeStyle Libre 3 Sensor Misc As directedfor glucose monitoring   gabapentin 300 MG capsule Commonly known as: NEURONTIN Take 600 mg by mouth 2 (two) times daily.   Jardiance 10 MG Tabs tablet Generic drug: empagliflozin Take 10 mg by mouth daily.   melatonin 3 MG Tabs tablet Take 3 mg by mouth at bedtime.   montelukast 10 MG tablet Commonly known as: SINGULAIR Take 10 mg by mouth at bedtime.   multivitamin with minerals Tabs tablet Take 1 tablet by mouth daily.   nortriptyline 50 MG capsule Commonly known as: PAMELOR at bedtime. Take 40mg  nightly for one week, then increase to 50mg  nightly   oxyCODONE-acetaminophen 5-325 MG tablet Commonly known as: Percocet Take 1 tablet by mouth every 4 (four) hours as needed for up to 5 days.   pantoprazole 40 MG tablet Commonly known as: PROTONIX Take 40 mg by mouth 2 (two) times daily.   propranolol 10 MG tablet Commonly known as: INDERAL Take 10 mg by mouth 2 (two) times daily.   pseudoephedrine-guaifenesin 60-600 MG 12 hr tablet Commonly known as: MUCINEX D Take 1 tablet  by mouth every 12 (twelve) hours as needed.   rizatriptan 10 MG tablet Commonly known as: MAXALT May take a second dose after 2 hours if needed.   senna 8.6 MG Tabs tablet Commonly known as: SENOKOT Take 1 tablet (8.6 mg total) by mouth daily as needed for mild constipation.   sitaGLIPtin 100 MG tablet Commonly known as: JANUVIA Take 100 mg by mouth daily.   tadalafil 5 MG tablet Commonly known as: CIALIS Take 1 tablet (5 mg total) by mouth daily.   tiZANidine 4 MG tablet Commonly known as: ZANAFLEX Take 1 tablet (4 mg total) by mouth 3 (three) times daily.   traZODone 50 MG tablet Commonly known as: DESYREL Take 50 mg by mouth at bedtime.   zonisamide 100 MG capsule Commonly known as: ZONEGRAN Take 100 mg by mouth daily.         Signed: Loleta Dicker 11/19/2022, 12:57 PM

## 2022-11-19 NOTE — Anesthesia Procedure Notes (Signed)
Procedure Name: Intubation Date/Time: 11/19/2022 7:22 AM  Performed by: Morene Crocker, CRNAPre-anesthesia Checklist: Patient identified, Patient being monitored, Timeout performed, Emergency Drugs available and Suction available Patient Re-evaluated:Patient Re-evaluated prior to induction Oxygen Delivery Method: Circle system utilized Preoxygenation: Pre-oxygenation with 100% oxygen Induction Type: IV induction Ventilation: Oral airway inserted - appropriate to patient size, Two handed mask ventilation required and Mask ventilation without difficulty Laryngoscope Size: 3 and McGraph Grade View: Grade I Tube type: Oral Tube size: 7.5 mm Number of attempts: 1 Airway Equipment and Method: Stylet Placement Confirmation: ETT inserted through vocal cords under direct vision, positive ETCO2 and breath sounds checked- equal and bilateral Secured at: 21 cm Tube secured with: Tape Dental Injury: Teeth and Oropharynx as per pre-operative assessment

## 2022-11-19 NOTE — Transfer of Care (Signed)
Immediate Anesthesia Transfer of Care Note  Patient: Daniel [name]  Procedure(s) Performed: RIGHT C5-6 FORAMINOTOMY (Right: Spine Cervical)  Patient Location: PACU  Anesthesia Type:General  Level of Consciousness: drowsy  Airway & Oxygen Therapy: Patient Spontanous Breathing and Patient connected to face mask oxygen  Post-op Assessment: Report given to RN and Post -op Vital signs reviewed and stable  Post vital signs: Reviewed and stable  Last Vitals:  Vitals Value Taken Time  BP 123/76 0909  Temp 35.9 0909  Pulse 94 0909  Resp 18 0909  SpO2 97 0909    Last Pain:  Vitals:   11/19/22 0621  TempSrc: Temporal  PainSc: 0-No pain         Complications: No notable events documented.

## 2022-11-19 NOTE — Interval H&P Note (Signed)
History and Physical Interval Note:  11/19/2022 6:36 AM  Daniel [name]  has presented today for surgery, with the diagnosis of M54.12 cervical radiculopathy.  The various methods of treatment have been discussed with the patient and family. After consideration of risks, benefits and other options for treatment, the patient has consented to  Procedure(s): RIGHT C5-6 FORAMINOTOMY (Right) as a surgical intervention.  The patient's history has been reviewed, patient examined, no change in status, stable for surgery.  I have reviewed the patient's chart and labs.  Questions were answered to the patient's satisfaction.    Heart sounds normal no MRG. Chest Clear to Auscultation Bilaterally.   [name]

## 2022-11-20 ENCOUNTER — Encounter: Payer: Self-pay | Admitting: Neurosurgery

## 2022-11-20 NOTE — Anesthesia Postprocedure Evaluation (Signed)
Anesthesia Post Note  Patient: LEKENDRICK ALPERN  Procedure(s) Performed: RIGHT C5-6 FORAMINOTOMY (Right: Spine Cervical)  Patient location during evaluation: PACU Anesthesia Type: General Level of consciousness: awake and alert Pain management: pain level controlled Vital Signs Assessment: post-procedure vital signs reviewed and stable Respiratory status: spontaneous breathing, nonlabored ventilation, respiratory function stable and patient connected to nasal cannula oxygen Cardiovascular status: blood pressure returned to baseline and stable Postop Assessment: no apparent nausea or vomiting Anesthetic complications: no   No notable events documented.   Last Vitals:  Vitals:   11/19/22 1100 11/19/22 1248  BP: 139/88 (!) 146/84  Pulse: 90 94  Resp: 14 16  Temp: (!) 36.2 C (!) 36.2 C  SpO2: 95% 98%    Last Pain:  Vitals:   11/19/22 1248  TempSrc: Temporal  PainSc: 3                  Stephanie Coup

## 2022-12-04 ENCOUNTER — Encounter: Payer: Self-pay | Admitting: Neurosurgery

## 2022-12-04 ENCOUNTER — Ambulatory Visit (INDEPENDENT_AMBULATORY_CARE_PROVIDER_SITE_OTHER): Payer: Medicare Other | Admitting: Neurosurgery

## 2022-12-04 VITALS — BP 112/62 | Temp 98.1°F | Ht 69.0 in | Wt 212.0 lb

## 2022-12-04 DIAGNOSIS — M5412 Radiculopathy, cervical region: Secondary | ICD-10-CM

## 2022-12-04 DIAGNOSIS — M4802 Spinal stenosis, cervical region: Secondary | ICD-10-CM

## 2022-12-04 DIAGNOSIS — Z09 Encounter for follow-up examination after completed treatment for conditions other than malignant neoplasm: Secondary | ICD-10-CM

## 2022-12-04 MED ORDER — OXYCODONE-ACETAMINOPHEN 5-325 MG PO TABS: 1.0000 | ORAL_TABLET | Freq: Two times a day (BID) | ORAL | 0 refills | Status: AC | PRN

## 2022-12-04 NOTE — Progress Notes (Signed)
   REFERRING PHYSICIAN:  Treston, Coker, Md [address] [place],  [place] [address]  DOS: 11/19/22 Right C5-6 foraminotomy  HISTORY OF PRESENT ILLNESS: Daniel [name] is about 2 weeks status post cervical foraminotomy. Overall, he is doing postoperatively.  He reports complete resolution of his right arm pain!  He continues to have some neck discomfort but this is well-managed by taking Percocet 5 mg twice daily.  PHYSICAL EXAMINATION:  NEUROLOGICAL:  General: In no acute distress.   Awake, alert, oriented to person, place, and time.  Pupils equal round and reactive to light.  Facial tone is symmetric.  Tongue protrusion is midline.  There is no pronator drift.  Strength: Side Biceps Triceps Deltoid Interossei Grip Wrist Ext. Wrist Flex.  R 5 5 5 5 5 5 5   L 5 5 5 5 5 5 5    Incision c/d/I  Imaging:  No interval imaging to review today  Assessment / Plan: is doing well after cervical foraminotomy.  Overall he is doing well and using his narcotic pain medication appropriately.  I will provide him with a small refill today.  We discussed activity escalation and I have advised the patient to lift up to 10 pounds until 6 weeks after surgery, then increase up to 25 pounds until 12 weeks after surgery.  After 12 weeks post-op, the patient advised to increase activity as tolerated.  Discussed return to work and I encouraged him to stay out for at least 1 more week.  If he is feeling well at the end of next week, we can discuss return to work with significant work restrictions.  He will call our office if he feels ready to do this.  he will return to clinic in approximately 4 weeks.  Advised to contact the office if any questions or concerns arise.   PA-C Dept of Neurosurgery

## 2022-12-15 ENCOUNTER — Encounter: Payer: Self-pay | Admitting: Neurosurgery

## 2022-12-20 ENCOUNTER — Emergency Department
Admission: EM | Admit: 2022-12-20 | Discharge: 2022-12-20 | Disposition: A | Discharge status: Home / Self Care | Payer: 59 | Attending: Emergency Medicine | Admitting: Emergency Medicine

## 2022-12-20 ENCOUNTER — Other Ambulatory Visit: Payer: Self-pay

## 2022-12-20 ENCOUNTER — Emergency Department: Payer: 59

## 2022-12-20 ENCOUNTER — Telehealth: Payer: 59 | Admitting: Physician Assistant

## 2022-12-20 ENCOUNTER — Encounter: Payer: Self-pay | Admitting: Physician Assistant

## 2022-12-20 DIAGNOSIS — R55 Syncope and collapse: Secondary | ICD-10-CM | POA: Insufficient documentation

## 2022-12-20 DIAGNOSIS — W19XXXA Unspecified fall, initial encounter: Secondary | ICD-10-CM

## 2022-12-20 DIAGNOSIS — M542 Cervicalgia: Secondary | ICD-10-CM | POA: Diagnosis not present

## 2022-12-20 DIAGNOSIS — S0990XA Unspecified injury of head, initial encounter: Secondary | ICD-10-CM | POA: Insufficient documentation

## 2022-12-20 DIAGNOSIS — I10 Essential (primary) hypertension: Secondary | ICD-10-CM | POA: Insufficient documentation

## 2022-12-20 DIAGNOSIS — W01198A Fall on same level from slipping, tripping and stumbling with subsequent striking against other object, initial encounter: Secondary | ICD-10-CM | POA: Insufficient documentation

## 2022-12-20 LAB — BASIC METABOLIC PANEL
Anion gap: 5 (ref 5–15)
BUN: 23 mg/dL — ABNORMAL HIGH (ref 6–20)
CO2: 27 mmol/L (ref 22–32)
Calcium: 9.3 mg/dL (ref 8.9–10.3)
Chloride: 103 mmol/L (ref 98–111)
Creatinine, Ser: 1.2 mg/dL (ref 0.61–1.24)
GFR, Estimated: >60 mL/min (ref >60)
Glucose, Bld: 109 mg/dL — ABNORMAL HIGH (ref 70–99)
Potassium: 4.1 mmol/L (ref 3.5–5.1)
Sodium: 135 mmol/L (ref 135–145)

## 2022-12-20 LAB — CBC
HCT: 43.5 % (ref 39.0–52.0)
Hemoglobin: 13.8 g/dL (ref 13.0–17.0)
MCH: 26.6 pg (ref 26.0–34.0)
MCHC: 31.7 g/dL (ref 30.0–36.0)
MCV: 84 fL (ref 80.0–100.0)
Platelets: 172 10*3/uL (ref 150–400)
RBC: 5.18 MIL/uL (ref 4.22–5.81)
RDW: 14.8 % (ref 11.5–15.5)
WBC: 4.3 10*3/uL (ref 4.0–10.5)
nRBC: 0 % (ref 0.0–0.2)

## 2022-12-20 LAB — TROPONIN I (HIGH SENSITIVITY): Troponin I (High Sensitivity): <2 ng/L (ref <18)

## 2022-12-20 LAB — CBG MONITORING, ED: Glucose-Capillary: 96 mg/dL (ref 70–99)

## 2022-12-20 MED ORDER — SODIUM CHLORIDE 0.9 % IV BOLUS
1000.0000 mL | Freq: Once | INTRAVENOUS | Status: AC
  Administered 2022-12-20: 1000 mL via INTRAVENOUS

## 2022-12-20 NOTE — ED Provider Notes (Signed)
[facility] Provider Note  Patient Contact: 4:14 PM (approximate)   History   Loss of Consciousness and Hypotension   HPI  Daniel [name] is a 52 y.o. male with a history of headaches, hypertension, presents to the emergency department after patient states that he had a mechanical fall and injured [place].  Patient states that he tripped over the curb and hit his head.  Patient states that he did not feel lightheaded or dizzy prior to mechanical fall.  He states that he has some mild neck pain.  No numbness or tingling in the upper and lower extremities.  No chest pain, chest tightness, shortness of breath or abdominal pain.  No abrasions or lacerations.      Physical Exam   Triage Vital Signs: ED Triage Vitals  Enc Vitals Group     BP 12/20/22 1340 117/76     Pulse Rate 12/20/22 1340 82     Resp 12/20/22 1340 16     Temp 12/20/22 1340 97.8 F (36.6 C)     Temp Source 12/20/22 1340 Oral     SpO2 12/20/22 1340 98 %     Weight 12/20/22 1340 212 lb (96.2 kg)     Height 12/20/22 1340 5\' 9"  (1.753 m)     Head Circumference --      Peak Flow --      Pain Score 12/20/22 1353 0     Pain Loc --      Pain Edu? --      Excl. in [place]? --     Most recent vital signs: Vitals:   12/20/22 1340 12/20/22 1707  BP: 117/76 116/80  Pulse: 82 73  Resp: 16 16  Temp: 97.8 F (36.6 C) 97.9 F (36.6 C)  SpO2: 98% 97%     General: Alert and in no acute distress. Eyes:  PERRL. EOMI. Head: No acute traumatic findings ENT:      Nose: No congestion/rhinnorhea.      Mouth/Throat: Mucous membranes are moist. Neck: No stridor. No cervical spine tenderness to palpation. Cardiovascular:  Good peripheral perfusion Respiratory: Normal respiratory effort without tachypnea or retractions. Lungs CTAB. Good air entry to the bases with no decreased or absent breath sounds. Gastrointestinal: Bowel sounds 4 quadrants. Soft and nontender to palpation. No guarding or rigidity.  No palpable masses. No distention. No CVA tenderness. Musculoskeletal: Full range of motion to all extremities.  Neurologic:  No gross focal neurologic deficits are appreciated.  Skin:   No rash noted Other:   ED Results / Procedures / Treatments   Labs (all labs ordered are listed, but only abnormal results are displayed) Labs Reviewed  BASIC METABOLIC PANEL - Abnormal; Notable for the following components:      Result Value   Glucose, Bld 109 (*)    BUN 23 (*)    All other components within normal limits  CBC  CBG MONITORING, ED  TROPONIN I (HIGH SENSITIVITY)     EKG  Normal sinus rhythm without ST segment elevation or other apparent arrhythmia.   RADIOLOGY  I personally viewed and evaluated these images as part of my medical decision making, as well as reviewing the written report by the radiologist.  ED Provider Interpretation: CT head and CT cervical spine showed no acute abnormality.   PROCEDURES:  Critical Care performed: No  Procedures   MEDICATIONS ORDERED IN ED: Medications  sodium chloride 0.9 % bolus 1,000 mL (0 mLs Intravenous Stopped 12/20/22 1826)  IMPRESSION / MDM / ASSESSMENT AND PLAN / ED COURSE  I reviewed the triage vital signs and the nursing notes.                              Assessment and plan: Fall 52 year old male presents to the emergency department after he had a mechanical fall outside a local pet store.  Vital signs reassuring at triage.  On exam, patient alert and nontoxic-appearing with no perceived neurodeficits.   CBC, BMP and troponin within range.   EKG indicates normal sinus rhythm without ST segment elevation or other apparent arrhythmia.   Patient denied a prodrome of dizziness or sensation of passing out before fall.  Triage note noted.  CT head and CT cervical spine shows no acute abnormality.  Patient did receive normal saline bolus in the emergency department and blood pressure remained approximately the  same.  Patient feels comfortable being discharged at this time.  Return precautions were given to return with new or worsening symptoms.   FINAL CLINICAL IMPRESSION(S) / ED DIAGNOSES   Final diagnoses:  Fall, initial encounter     Rx / DC Orders   ED Discharge Orders     None        Note:  This document was prepared using Dragon voice recognition software and may include unintentional dictation errors.   Vallarie Mare Wh[place]ral, PA-C 12/20/22 [name]    Harvest Dark, MD 12/20/22 2329

## 2022-12-20 NOTE — Patient Instructions (Signed)
Daniel [name], thank you for joining [name], PA-C for today's virtual visit.  While this provider is not your primary care provider (PCP), if your PCP is located in our provider database this encounter information will be shared with them immediately following your visit.   [facility] account gives you access to today's visit and all your visits, tests, and labs performed at [facility] " click here if you don't have a [place] account or go to [email or web address]  Consent: (Patient) Daniel [name] provided verbal consent for this virtual visit at the beginning of the encounter.  Current Medications:  Current Outpatient Medications:    albuterol (PROVENTIL HFA;VENTOLIN HFA) 108 (90 Base) MCG/ACT inhaler, Inhale 1 puff into the lungs every 4 (four) hours as needed for wheezing or shortness of breath. , Disp: , Rfl:    albuterol (PROVENTIL) (2.5 MG/3ML) 0.083% nebulizer solution, Take 2.5 mg by nebulization every 4 (four) hours as needed for wheezing. , Disp: , Rfl:    atorvastatin (LIPITOR) 40 MG tablet, Take 40 mg by mouth at bedtime., Disp: , Rfl:    budesonide (PULMICORT) 1 MG/2ML nebulizer solution, INHALE 1 VIAL VIA NEBULIZER ONCE DAILY, Disp: , Rfl:    celecoxib (CELEBREX) 200 MG capsule, Take 1 capsule by mouth 2 (two) times daily., Disp: , Rfl:    Continuous Blood Gluc Sensor (FREESTYLE LIBRE 3 SENSOR) MISC, As directedfor glucose monitoring, Disp: , Rfl:    divalproex (DEPAKOTE ER) 250 MG 24 hr tablet, Take 250 mg by mouth in the morning and at bedtime., Disp: , Rfl:    DULoxetine (CYMBALTA) 20 MG capsule, Take 20 mg by mouth at bedtime., Disp: , Rfl:    empagliflozin (JARDIANCE) 10 MG TABS tablet, Take 10 mg by mouth daily., Disp: , Rfl:    emtricitabine-tenofovir AF (DESCOVY) 200-25 MG tablet, Take 1 tablet by mouth daily., Disp: , Rfl:    FLUoxetine (PROZAC) 20 MG capsule, Take 20 mg by mouth daily., Disp: , Rfl:    gabapentin  (NEURONTIN) 300 MG capsule, Take 600 mg by mouth 2 (two) times daily., Disp: , Rfl:    Galcanezumab-gnlm (EMGALITY) 120 MG/ML SOAJ, Inject 120 mg into the skin every 30 (thirty) days., Disp: , Rfl:    Melatonin 3 MG TABS, Take 3 mg by mouth at bedtime. , Disp: , Rfl:    montelukast (SINGULAIR) 10 MG tablet, Take 10 mg by mouth at bedtime., Disp: , Rfl:    Multiple Vitamin (MULTIVITAMIN WITH MINERALS) TABS tablet, Take 1 tablet by mouth daily., Disp: , Rfl:    nortriptyline (PAMELOR) 50 MG capsule, at bedtime. Take 40mg  nightly for one week, then increase to 50mg  nightly, Disp: , Rfl:    pantoprazole (PROTONIX) 40 MG tablet, Take 40 mg by mouth 2 (two) times daily., Disp: , Rfl:    propranolol (INDERAL) 10 MG tablet, Take 10 mg by mouth 2 (two) times daily., Disp: , Rfl:    pseudoephedrine-guaifenesin (MUCINEX D) 60-600 MG 12 hr tablet, Take 1 tablet by mouth every 12 (twelve) hours as needed., Disp: , Rfl:    rizatriptan (MAXALT) 10 MG tablet, May take a second dose after 2 hours if needed., Disp: , Rfl:    sitaGLIPtin (JANUVIA) 100 MG tablet, Take 100 mg by mouth daily., Disp: , Rfl:    tadalafil (CIALIS) 5 MG tablet, Take 1 tablet (5 mg total) by mouth daily., Disp: 30 tablet, Rfl: 11   tiZANidine (ZANAFLEX) 4 MG tablet, Take  1 tablet (4 mg total) by mouth 3 (three) times daily., Disp: 30 tablet, Rfl: 0   traZODone (DESYREL) 50 MG tablet, Take 50 mg by mouth at bedtime., Disp: , Rfl:    zonisamide (ZONEGRAN) 100 MG capsule, Take 100 mg by mouth daily., Disp: , Rfl:    Medications ordered in this encounter:  No orders of the defined types were placed in this encounter.    *If you need refills on other medications prior to your next appointment, please contact your pharmacy*  Follow-Up: Call back or seek an in-person evaluation if the symptoms worsen or if the condition fails to improve as anticipated.  [facility] [phone]  Other Instructions Please keep hydrated. Use  link below to get scheduled for in person evaluation today at local Urgent Care for exam and manual check of your BP. If you note any lightheadedness, dizziness, chest pain, shortness of breath -- 911 for ER evaluation. DO NOT DELAY CARE.   If you have been instructed to have an in-person evaluation today at a local Urgent Care facility, please use the link below. It will take you to a list of all of our available [facility], including address, phone number and hours of operation. Please do not delay care.  [facility]  If you or a family member do not have a primary care provider, use the link below to schedule a visit and establish care. When you choose a [facility] primary care physician or advanced practice provider, you gain a long-term partner in health. Find a Primary Care Provider  Learn more about 's in-office and virtual care options: [facility]

## 2022-12-20 NOTE — ED Triage Notes (Signed)
Pt to ED for syncopal episode at [employer] store around 11am today Denies known injury but is unsure if he fully passed out or not. He went home after this and checked his own BP and it was low. Sitting was 85/63, standing was 71/53. BP here is 117/76 and pt walked to triage room 3 with no profound dizziness.  EKG performed. Endorses mild dizziness. Checked CBG at home, 128. Pt is type 2 DM. Denies cardiac hx.   States that R arm also feels "a little numb" since around same time (11am) but upon examination pt has equal sensation, equal grip strength and motor function to both arms and NIHss of 0.

## 2022-12-20 NOTE — Progress Notes (Signed)
Virtual Visit Consent   Daniel [name], you are scheduled for a virtual visit with a [facility] [place] provider today. Just as with appointments in the office, your consent must be obtained to participate. Your consent will be active for this visit and any virtual visit you may have with one of our providers in the next 365 days. If you have a MyChart account, a copy of this consent can be sent to you electronically.  As this is a virtual visit, video technology does not allow for your provider to perform a traditional examination. This may limit your provider's ability to fully assess your condition. If your provider identifies any concerns that need to be evaluated in person or the need to arrange testing (such as labs, EKG, etc.), we will make arrangements to do so. Although advances in technology are sophisticated, we cannot ensure that it will always work on either your end or our end. If the connection with a video visit is poor, the visit may have to be switched to a telephone visit. With either a video or telephone visit, we are not always able to ensure that we have a secure connection.  By engaging in this virtual visit, you consent to the provision of healthcare and authorize for your insurance to be billed (if applicable) for the services provided during this visit. Depending on your insurance coverage, you may receive a charge related to this service.  I need to obtain your verbal consent now. Are you willing to proceed with your visit today? Daniel [name] has provided verbal consent on 12/20/2022 for a virtual visit (video or telephone). Daniel [name], [place]  Date: 12/20/2022 1:03 PM  Virtual Visit via Video Note   I, Daniel [name], connected with  Daniel [name]  ([id number], 1971-10-28) on 12/20/22 at  2:15 PM EST by a video-enabled telemedicine application and verified that I am speaking with the correct person using two identifiers.  Location: Patient: Virtual Visit Location  Patient: Home Provider: Virtual Visit Location Provider: Home Office   I discussed the limitations of evaluation and management by telemedicine and the availability of in person appointments. The patient expressed understanding and agreed to proceed.    History of Present Illness: Daniel [name] is a 52 y.o. who identifies as a male who was assigned male at birth, and is being seen today for low BP levels on home check the past day. Notes usually BP runs normally but has periodic episodes of lower BP. Today noted BP on new cuff at 85/63 sitting and 71/53 upon standing. Notes some mild LH with sudden position change that has seemed to resolve. Repeat BP now at 98/65. Patient denies chest pain, palpitations, dizziness, vision changes or frequent headaches. Has taken his first dose of propranolol today as well as his Jardiance. Notes hydrating well. Denies change to diet.   BP Readings from Last 3 Encounters:  12/04/22 112/62  11/19/22 (!) 146/84  11/11/22 92/67   Lab Results  Component Value Date   HGB 13.6 09/30/2022   Glucose at time of visit is 128 NF.  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   Lower urinary tract symptoms (LUTS) 05/13/2019   Recurrent bronchospasm 03/15/2019   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   Incomplete tear of right rotator cuff 10/15/2017   Tendinitis of upper biceps tendon of right shoulder 10/15/2017   Seborrhea 09/23/2017   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 09/09/2017    Rotator cuff tendinitis, right 09/09/2017   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   Displacement of lumbar intervertebral disc without myelopathy 04/04/2015   Facet arthritis of lumbar region 03/14/2015   DDD (degenerative disc disease), lumbar 01/02/2015   Lumbar radiculitis 01/02/2015   DM2 (diabetes mellitus, type 2) ([place]) 10/30/2013   Knee joint effusion 03/14/2013   Left knee pain 03/14/2013   Articular cartilage disorder, other specified site 11/09/2012   Chronic back pain 09/10/2012    Allergies:  Allergies  Allergen Reactions   Doxycycline Nausea And Vomiting    Other reaction(s): Vomiting   Ketorolac Itching    Other reaction(s): Unknown   Omega-3     Other reaction(s): Other (See Comments) Other reaction(s): Unknown Mercaptobenzothiazole   Other Other (See Comments)    Confirmed by allergy testing Mercaptobenzothiazole Other reaction(s): Unknown Other reaction(s): Other (See Comments) Mercaptobenzothiazole  Mercaptobenzothiazole   Parabens Other (See Comments)    Confirmed by allergy testing Other reaction(s): Unknown Other reaction(s): Other (See Comments) Other reaction(s): Unknown   Tramadol Itching and Other (See Comments)    Tolerates extended release Other reaction(s): Other (See Comments) Other reaction(s): Other (see comments), Other (See Comments) Tolerates extended release Tolerates extended release Tolerates extended release Tolerates extended release Tolerates extended release Tolerates extended release   Penicillins Rash, Other (See Comments) and Hives    Has patient had a PCN reaction causing immediate rash, facial/tongue/throat swelling, SOB or lightheadedness with hypotension: Yes Has patient had a PCN reaction causing severe rash involving mucus membranes or skin necrosis: No Has patient had a PCN reaction that required hospitalization No Has patient had a PCN reaction occurring within the last 10 years: No If all of the  above answers are "NO", then may proceed with Cephalosporin use. Other reaction(s): Other Other reaction(s): Other (See Comments) Has patient had a PCN reaction causing immediate rash, facial/tongue/throat swelling, SOB or lightheadedness with hypotension: Yes Has patient had a PCN reaction causing severe rash involving mucus membranes or skin necrosis: No Has patient had a PCN reaction that required hospitalization No Has patient had a PCN reaction occurring within the last 10 years: No If all of the above answers are "NO", then may proceed with Cephalosporin use. Can take keflexCan take keflex Has patient had a PCN reaction causing immediate rash, facial/tongue/throat swelling, SOB or lightheadedness with hypotension: Yes Has patient had a PCN reaction causing severe rash involving mucus membranes or skin necrosis: No Has patient had a PCN reaction that required hospitalization No Has patient had a PCN reaction occurring within the last 10 years: No If all of the above answers are "NO", then may proceed with Cephalosporin use. Other reaction(s): Unknown   Medications:  Current Outpatient Medications:    albuterol (PROVENTIL HFA;VENTOLIN HFA) 108 (90 Base) MCG/ACT inhaler, Inhale 1 puff into the lungs every 4 (four) hours as needed for wheezing or shortness of breath. , Disp: , Rfl:    albuterol (PROVENTIL) (2.5 MG/3ML) 0.083% nebulizer solution, Take 2.5 mg by nebulization every 4 (four) hours as needed for wheezing. , Disp: , Rfl:  atorvastatin (LIPITOR) 40 MG tablet, Take 40 mg by mouth at bedtime., Disp: , Rfl:    budesonide (PULMICORT) 1 MG/2ML nebulizer solution, INHALE 1 VIAL VIA NEBULIZER ONCE DAILY, Disp: , Rfl:    celecoxib (CELEBREX) 200 MG capsule, Take 1 capsule by mouth 2 (two) times daily., Disp: , Rfl:    Continuous Blood Gluc Sensor (FREESTYLE LIBRE 3 SENSOR) MISC, As directedfor glucose monitoring, Disp: , Rfl:    divalproex (DEPAKOTE ER) 250 MG 24 hr tablet, Take 250 mg  by mouth in the morning and at bedtime., Disp: , Rfl:    DULoxetine (CYMBALTA) 20 MG capsule, Take 20 mg by mouth at bedtime., Disp: , Rfl:    empagliflozin (JARDIANCE) 10 MG TABS tablet, Take 10 mg by mouth daily., Disp: , Rfl:    emtricitabine-tenofovir AF (DESCOVY) 200-25 MG tablet, Take 1 tablet by mouth daily., Disp: , Rfl:    FLUoxetine (PROZAC) 20 MG capsule, Take 20 mg by mouth daily., Disp: , Rfl:    gabapentin (NEURONTIN) 300 MG capsule, Take 600 mg by mouth 2 (two) times daily., Disp: , Rfl:    Galcanezumab-gnlm (EMGALITY) 120 MG/ML SOAJ, Inject 120 mg into the skin every 30 (thirty) days., Disp: , Rfl:    Melatonin 3 MG TABS, Take 3 mg by mouth at bedtime. , Disp: , Rfl:    montelukast (SINGULAIR) 10 MG tablet, Take 10 mg by mouth at bedtime., Disp: , Rfl:    Multiple Vitamin (MULTIVITAMIN WITH MINERALS) TABS tablet, Take 1 tablet by mouth daily., Disp: , Rfl:    nortriptyline (PAMELOR) 50 MG capsule, at bedtime. Take 40mg  nightly for one week, then increase to 50mg  nightly, Disp: , Rfl:    pantoprazole (PROTONIX) 40 MG tablet, Take 40 mg by mouth 2 (two) times daily., Disp: , Rfl:    propranolol (INDERAL) 10 MG tablet, Take 10 mg by mouth 2 (two) times daily., Disp: , Rfl:    pseudoephedrine-guaifenesin (MUCINEX D) 60-600 MG 12 hr tablet, Take 1 tablet by mouth every 12 (twelve) hours as needed., Disp: , Rfl:    rizatriptan (MAXALT) 10 MG tablet, May take a second dose after 2 hours if needed., Disp: , Rfl:    sitaGLIPtin (JANUVIA) 100 MG tablet, Take 100 mg by mouth daily., Disp: , Rfl:    tadalafil (CIALIS) 5 MG tablet, Take 1 tablet (5 mg total) by mouth daily., Disp: 30 tablet, Rfl: 11   tiZANidine (ZANAFLEX) 4 MG tablet, Take 1 tablet (4 mg total) by mouth 3 (three) times daily., Disp: 30 tablet, Rfl: 0   traZODone (DESYREL) 50 MG tablet, Take 50 mg by mouth at bedtime., Disp: , Rfl:    zonisamide (ZONEGRAN) 100 MG capsule, Take 100 mg by mouth daily., Disp: , Rfl:    Observations/Objective: Patient is well-developed, well-nourished in no acute distress.  Resting comfortably at home.  Head is normocephalic, atraumatic.  No labored breathing. Speech is clear and coherent with logical content.  Patient is alert and oriented at baseline.   Assessment and Plan: 1. Hypotension, unspecified hypotension type  Low BP noted on home checks with new cuff that has been accurate previously per patient. One episode of positional lightheadedness. Asymptomatic at present. BP on recheck improved but systolic still lower. Want him seen in person for exam and further evaluation. He agrees to be seen at [facility] today. Strict ER precautions reviewed.   No charge for video visit due to need for in-person evaluation.   Follow Up Instructions: I discussed  the assessment and treatment plan with the patient. The patient was provided an opportunity to ask questions and all were answered. The patient agreed with the plan and demonstrated an understanding of the instructions.  A copy of instructions were sent to the patient via MyChart unless otherwise noted below.   The patient was advised to call back or seek an in-person evaluation if the symptoms worsen or if the condition fails to improve as anticipated.  Time:  I spent 10 minutes with the patient via telehealth technology discussing the above problems/concerns.    Daniel Rio, PA-C

## 2023-01-01 ENCOUNTER — Ambulatory Visit (INDEPENDENT_AMBULATORY_CARE_PROVIDER_SITE_OTHER): Payer: 59 | Admitting: Neurosurgery

## 2023-01-01 ENCOUNTER — Encounter: Payer: Self-pay | Admitting: Neurosurgery

## 2023-01-01 VITALS — BP 118/77 | HR 100 | Temp 98.0°F | Wt 214.4 lb

## 2023-01-01 DIAGNOSIS — M4802 Spinal stenosis, cervical region: Secondary | ICD-10-CM

## 2023-01-01 DIAGNOSIS — M5412 Radiculopathy, cervical region: Secondary | ICD-10-CM

## 2023-01-01 DIAGNOSIS — Z09 Encounter for follow-up examination after completed treatment for conditions other than malignant neoplasm: Secondary | ICD-10-CM

## 2023-01-01 NOTE — Progress Notes (Signed)
   REFERRING PHYSICIAN:  Jemel, Ono, Md [address] [place],  [place] [address]  DOS: 11/19/22 Right C5-6 foraminotomy  HISTORY OF PRESENT ILLNESS: JERREN FLINCHBAUGH is status post cervical foraminotomy.  His pain is much improved.  He is very happy with his improvement.     PHYSICAL EXAMINATION:  NEUROLOGICAL:  General: In no acute distress.   Awake, alert, oriented to person, place, and time.  Pupils equal round and reactive to light.  Facial tone is symmetric.  Tongue protrusion is midline.  There is no pronator drift.  Strength: Side Biceps Triceps Deltoid Interossei Grip Wrist Ext. Wrist Flex.  R 5 5 5 5 5 5 5   L 5 5 5 5 5 5 5    Incision c/d/I  Imaging:  No interval imaging to review today  Assessment / Plan: [name] is doing well after cervical foraminotomy.  I am very pleased with his improvements.  We reviewed his activity limitations.  He will return to work on February 5.  I will see him back in 6 weeks.     Meade Maw MD Dept of Neurosurgery

## 2023-01-05 IMAGING — MR MR SHOULDER*R* W/O CM
7 series · 39 of 40 positions shown · non-contrast
Comparison: MRI right shoulder 08/21/2017.

CLINICAL DATA: Chronic anterior right shoulder pain. History of
prior surgery.

EXAM:
MRI OF THE RIGHT SHOULDER WITHOUT CONTRAST
TECHNIQUE: Multiplanar, multisequence MR imaging of the shoulder was performed.
No intravenous contrast was administered.

[Series 3: T2 fat-sat · axial · right · 4.0mm · 0.44mm/px · z∈[+1,+121]mm · 7 of 26 slices shown (1 of 5)]
[im 1/26]
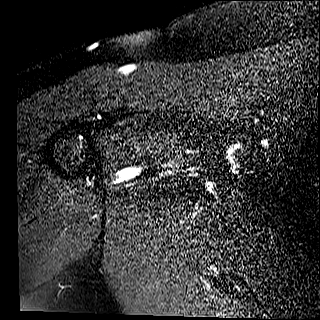
[im 5/26]
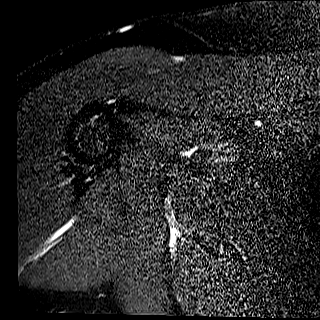
[im 9/26]
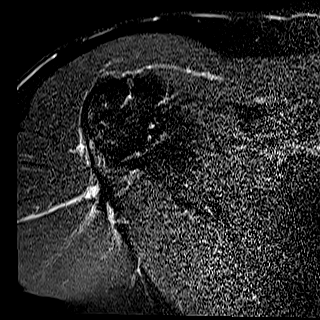
[im 13/26]
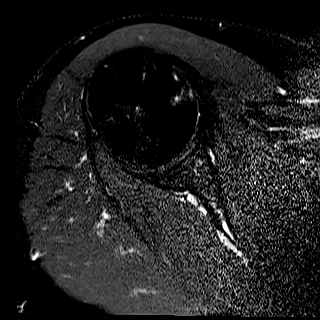
[im 17/26]
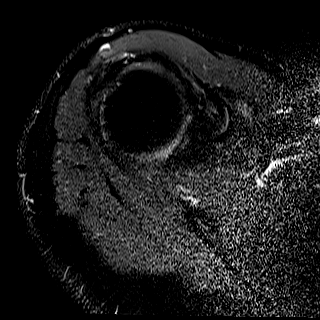
[im 21/26]
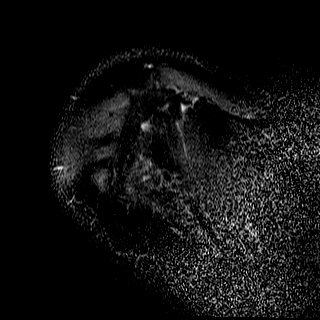
[im 26/26]
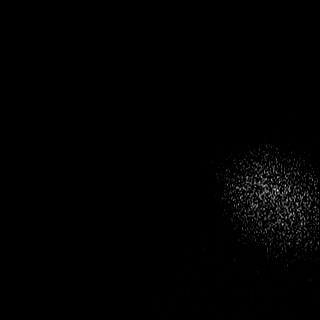

[Series 4: PD · oblique · right · 4.0mm · 0.44mm/px · 6 of 26 slices shown]
[im 1/26]
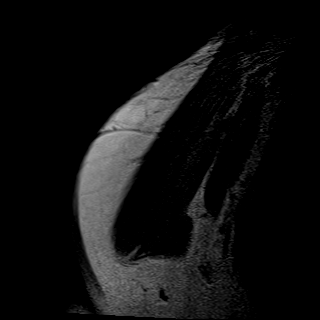
[im 6/26]
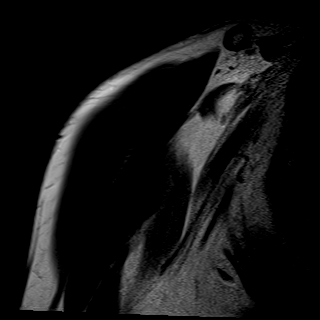
[im 11/26]
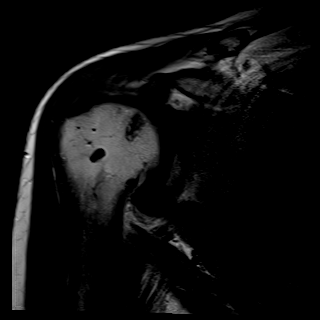
[im 16/26]
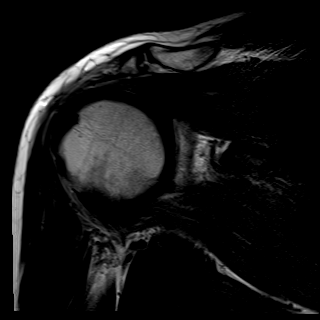
[im 21/26]
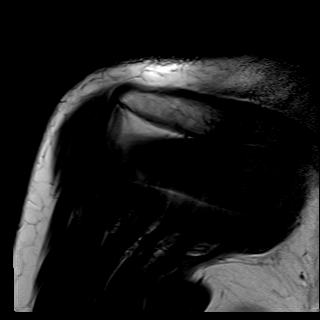
[im 26/26]
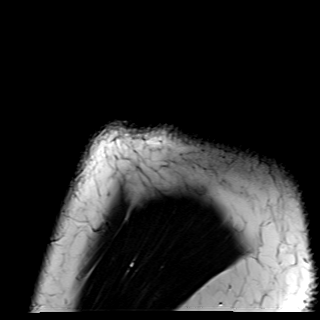

[Series 5: T2 fat-sat · oblique · right · 4.0mm · 0.44mm/px · 6 of 26 slices shown (2 of 5)]
[im 1/26]
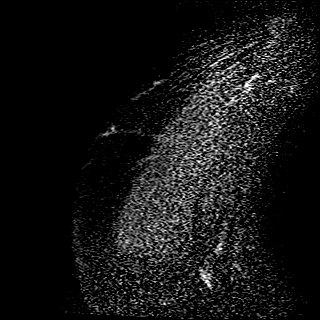
[im 6/26]
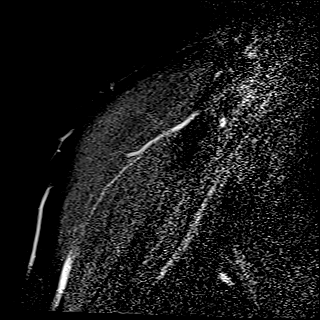
[im 11/26]
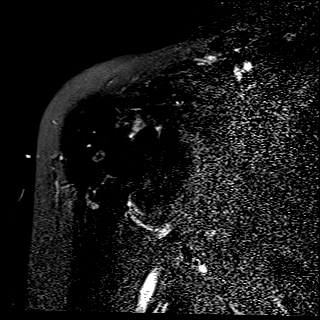
[im 16/26]
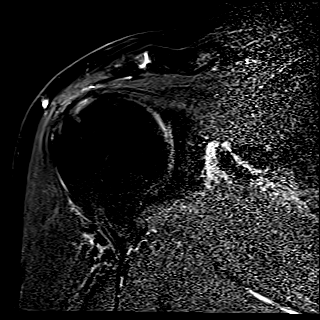
[im 21/26]
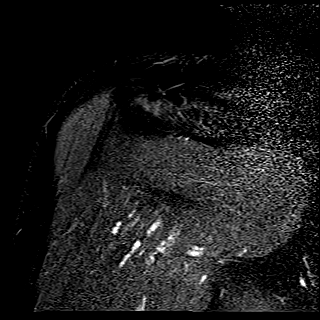
[im 26/26]
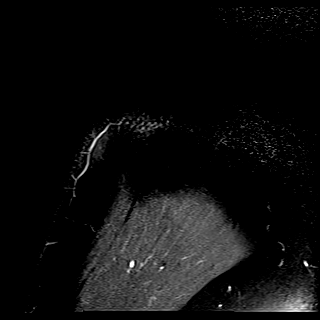

[Series 6: T2 fat-sat · oblique · right · 4.0mm · 0.22mm/px · 5 of 22 slices shown (3 of 5)]
[im 1/22]
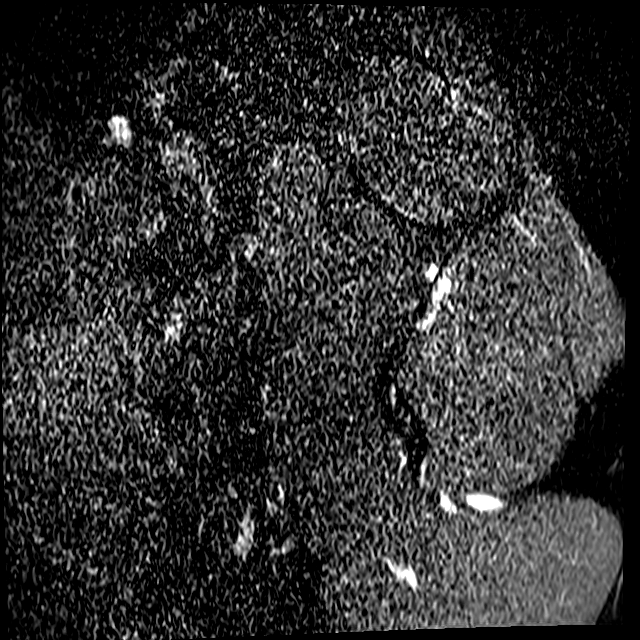
[im 6/22]
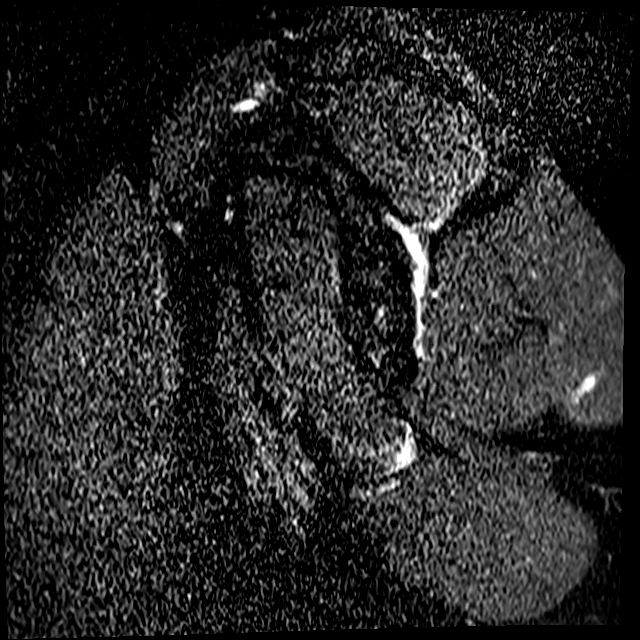
[im 11/22]
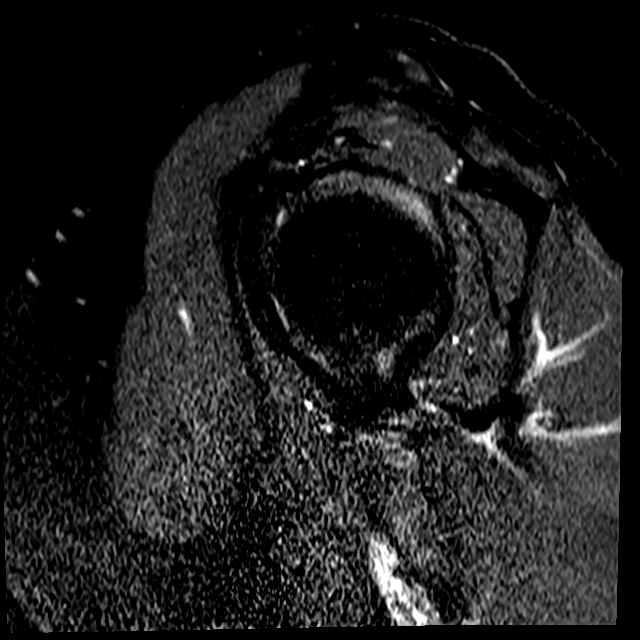
[im 16/22]
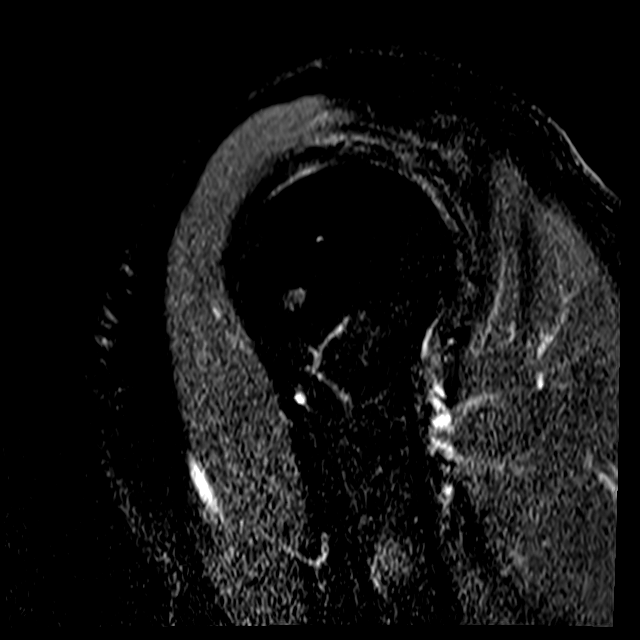
[im 22/22]
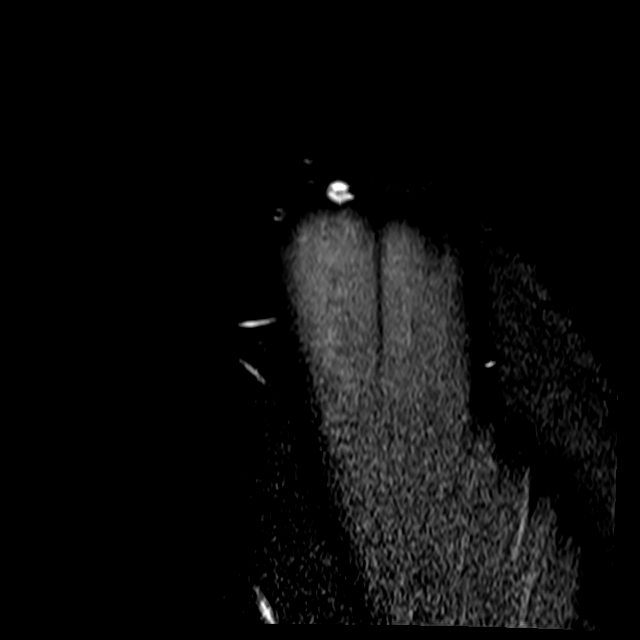

[Series 7: T1 · oblique · right · 4.0mm · 0.44mm/px · 4 of 22 slices shown]
[im 1/22]
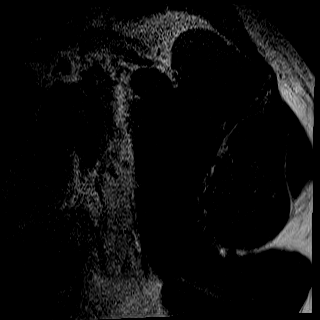
[im 6/22]
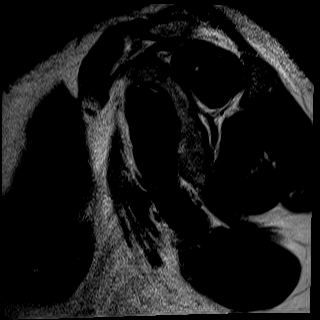
[im 11/22]
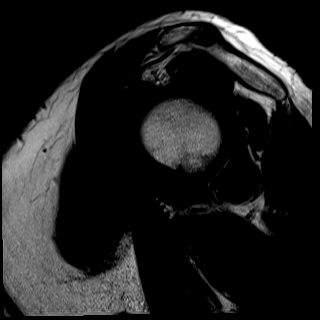
[im 16/22]
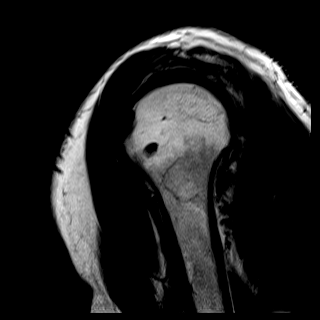

[Series 8: T2 fat-sat · oblique · right · 4.0mm · 0.62mm/px · 5 of 22 slices shown (4 of 5)]
[im 1/22]
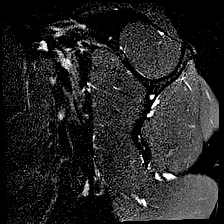
[im 6/22]
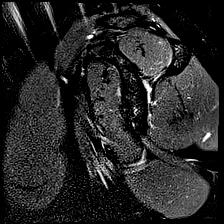
[im 11/22]
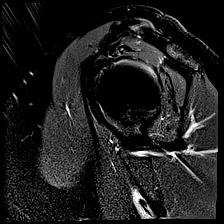
[im 16/22]
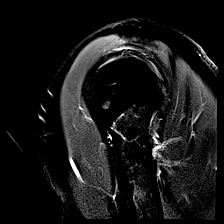
[im 22/22]
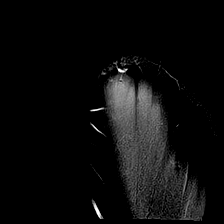

[Series 9: T2 fat-sat · oblique · right · 4.0mm · 0.62mm/px · 6 of 26 slices shown (5 of 5)]
[im 1/26]
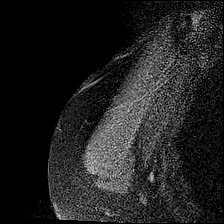
[im 6/26]
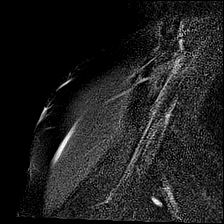
[im 11/26]
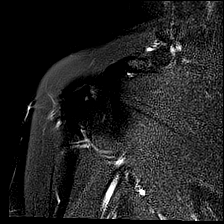
[im 16/26]
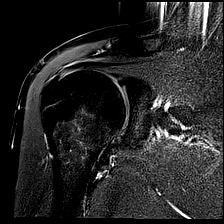
[im 21/26]
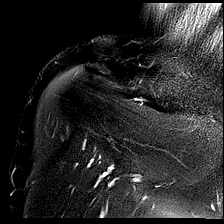
[im 26/26]
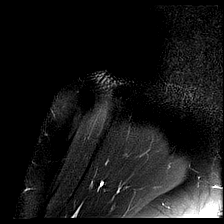

[39 of 40 positions shown; findings below may reference images not displayed]

FINDINGS: Rotator cuff: Intact. Mild supraspinatus and infraspinatus
tendinopathy is seen. 2 very small fissures in the distal
supraspinatus are noted. Negative for full-thickness tear or tendon
retraction.

Muscles:  Normal without atrophy or focal lesion.

Biceps long head: Status post biceps tenodesis. No complicating
feature.

Acromioclavicular Joint: The joint has been debrided. Marrow edema
about the joint seen on the prior examination has resolved. Type 1
acromion. No subacromial/subdeltoid bursal fluid. The patient is
status post acromioplasty.

Glenohumeral Joint: Appears normal.

Labrum:  Intact.

Bones:  No fracture or focal lesion.

Other: None.
IMPRESSION: Mild appearing supraspinatus and infraspinatus tendinopathy with 2
very small fissures in the distal supraspinatus. Negative for
full-thickness tear or tendon retraction.

Status post debridement of the AC joint. Marrow edema about the
joint seen on the prior examination has resolved.

Status post biceps tenodesis and acromioplasty without evidence of
complication.

## 2023-02-02 ENCOUNTER — Ambulatory Visit (INDEPENDENT_AMBULATORY_CARE_PROVIDER_SITE_OTHER): Payer: 59 | Admitting: Internal Medicine

## 2023-02-02 VITALS — BP 135/85 | HR 101 | Resp 16 | Ht 69.0 in | Wt 214.0 lb

## 2023-02-02 DIAGNOSIS — E669 Obesity, unspecified: Secondary | ICD-10-CM

## 2023-02-02 DIAGNOSIS — G4733 Obstructive sleep apnea (adult) (pediatric): Secondary | ICD-10-CM | POA: Diagnosis not present

## 2023-02-02 NOTE — Progress Notes (Signed)
Sleep Medicine   Office Visit  Patient Name: Daniel [name] DOB: Nov 02, 1971 MRN [id number]    Chief Complaint: sleep evaluation  Brief History:  Con presents with a many years history of loud snoring. This is worse in the supine position.   Sleep quality is good. This is noted most nights. The patient's bed partner reports  loud snoring and witnessed apnea at night. The patient relates the following symptoms: Loud snoring, gasping, excessive daytime sleepiness, non restorative sleep, and morning headaches are also present. The patient goes to sleep at 11-12am and wakes up at 10am.    Sleep quality is same when outside home environment.  Patient has noted no restlessness of his legs at night.  The patient  relates no unusual behavior during the night.  The patient reports a history of psychiatric problems (bipolar disorder) . The Epworth Sleepiness Score is 8 out of 24 .  The patient relates  Cardiovascular risk factors include: none    ROS  General: (-) fever, (-) chills, (-) night sweat Nose and Sinuses: (-) nasal stuffiness or itchiness, (-) postnasal drip, (-) nosebleeds, (-) sinus trouble. Mouth and Throat: (-) sore throat, (-) hoarseness. Neck: (-) swollen glands, (-) enlarged thyroid, (-) neck pain. Respiratory: + cough, + shortness of breath, + wheezing. Neurologic: - numbness, - tingling. Psychiatric: - anxiety, - depression Sleep behavior: -sleep paralysis -hypnogogic hallucinations -dream enactment      -vivid dreams -cataplexy -night terrors -sleep walking   Current Medication: Outpatient Encounter Medications as of 02/02/2023  Medication Sig   albuterol (PROVENTIL HFA;VENTOLIN HFA) 108 (90 Base) MCG/ACT inhaler Inhale 1 puff into the lungs every 4 (four) hours as needed for wheezing or shortness of breath.    albuterol (PROVENTIL) (2.5 MG/3ML) 0.083% nebulizer solution Take 2.5 mg by nebulization every 4 (four) hours as needed for wheezing.    atorvastatin (LIPITOR) 40 MG  tablet Take 40 mg by mouth at bedtime.   budesonide (PULMICORT) 1 MG/2ML nebulizer solution INHALE 1 VIAL VIA NEBULIZER ONCE DAILY   celecoxib (CELEBREX) 200 MG capsule Take 1 capsule by mouth 2 (two) times daily.   Continuous Blood Gluc Sensor (FREESTYLE LIBRE 3 SENSOR) MISC As directedfor glucose monitoring   divalproex (DEPAKOTE ER) 250 MG 24 hr tablet Take 250 mg by mouth in the morning and at bedtime.   DULoxetine (CYMBALTA) 20 MG capsule Take 20 mg by mouth at bedtime.   empagliflozin (JARDIANCE) 10 MG TABS tablet Take 10 mg by mouth daily.   emtricitabine-tenofovir AF (DESCOVY) 200-25 MG tablet Take 1 tablet by mouth daily.   FLUoxetine (PROZAC) 20 MG capsule Take 20 mg by mouth daily.   gabapentin (NEURONTIN) 300 MG capsule Take 600 mg by mouth 2 (two) times daily.   Galcanezumab-gnlm (EMGALITY) 120 MG/ML SOAJ Inject 120 mg into the skin every 30 (thirty) days.   Melatonin 3 MG TABS Take 3 mg by mouth at bedtime.    montelukast (SINGULAIR) 10 MG tablet Take 10 mg by mouth at bedtime.   Multiple Vitamin (MULTIVITAMIN WITH MINERALS) TABS tablet Take 1 tablet by mouth daily.   nortriptyline (PAMELOR) 50 MG capsule at bedtime. Take '40mg'$  nightly for one week, then increase to '50mg'$  nightly   pantoprazole (PROTONIX) 40 MG tablet Take 40 mg by mouth 2 (two) times daily.   propranolol (INDERAL) 10 MG tablet Take 10 mg by mouth 2 (two) times daily.   pseudoephedrine-guaifenesin (MUCINEX D) 60-600 MG 12 hr tablet Take 1 tablet by mouth every 12 (  twelve) hours as needed.   rizatriptan (MAXALT) 10 MG tablet May take a second dose after 2 hours if needed.   sitaGLIPtin (JANUVIA) 100 MG tablet Take 100 mg by mouth daily.   tadalafil (CIALIS) 5 MG tablet Take 1 tablet (5 mg total) by mouth daily.   tiZANidine (ZANAFLEX) 4 MG tablet Take 1 tablet (4 mg total) by mouth 3 (three) times daily.   traZODone (DESYREL) 50 MG tablet Take 50 mg by mouth at bedtime.   zonisamide (ZONEGRAN) 100 MG capsule Take  100 mg by mouth daily.   [DISCONTINUED] famotidine (PEPCID) 20 MG tablet Take 1 tablet (20 mg total) by mouth 2 (two) times daily.   No facility-administered encounter medications on file as of 02/02/2023.    Surgical History: Past Surgical History:  Procedure Laterality Date   CHOLECYSTECTOMY     COLONOSCOPY W/ BIOPSIES     FRACTURE SURGERY     tail bone   knee arthroscpy Right    POSTERIOR CERVICAL LAMINECTOMY Right 11/19/2022   Procedure: RIGHT C5-6 FORAMINOTOMY;  Surgeon: Meade Maw, MD;  Location: [facility];  Service: Neurosurgery;  Laterality: Right;   SHOULDER ARTHROSCOPY WITH DISTAL CLAVICLE RESECTION Right 10/13/2017   Procedure: SHOULDER ARTHROSCOPY WITH DISTAL CLAVICLE RESECTION;  Surgeon: Corky Mull, MD;  Location: [facility];  Service: Orthopedics;  Laterality: Right;   SHOULDER ARTHROSCOPY WITH OPEN ROTATOR CUFF REPAIR Right 10/13/2017   Procedure: SHOULDER ARTHROSCOPY WITH OPEN ROTATOR CUFF REPAIR/;  Surgeon: Corky Mull, MD;  Location: [facility];  Service: Orthopedics;  Laterality: Right;   TOTAL KNEE ARTHROPLASTY Right 12/06/2019   Procedure: TOTAL KNEE ARTHROPLASTY;  Surgeon: Hessie Knows, MD;  Location: [facility];  Service: Orthopedics;  Laterality: Right;   TOTAL KNEE ARTHROPLASTY Right 05/14/2021   Procedure: REVISION TOTAL KNEE ARTHROPLASTY - Rachelle Hora to Assist;  Surgeon: Hessie Knows, MD;  Location: [facility];  Service: Orthopedics;  Laterality: Right;    Medical History: Past Medical History:  Diagnosis Date   Asthma    Bipolar 1 disorder (HCC)    Chronic back pain    DDD (degenerative disc disease), cervical    Depression    Diabetes mellitus without complication (HCC)    ED (erectile dysfunction)    Elevated lipids    GERD (gastroesophageal reflux disease)    Headache    Insomnia    Migraine    Seasonal allergies    Wheezing     Family History: Non contributory to the present illness  Social History: Social History   Socioeconomic  History   Marital status: Single    Spouse name: Not on file   Number of children: Not on file   Years of education: Not on file   Highest education level: Not on file  Occupational History   Not on file  Tobacco Use   Smoking status: Former    Packs/day: 0.50    Types: Cigarettes    Quit date: 11/28/2014    Years since quitting: 8.1   Smokeless tobacco: Never  Vaping Use   Vaping Use: Never used  Substance and Sexual Activity   Alcohol use: No    Alcohol/week: 0.0 standard drinks of alcohol   Drug use: No   Sexual activity: Not on file  Other Topics Concern   Not on file  Social History Narrative   Not on file   Social Determinants of Health   Financial Resource Strain: Not on file  Food Insecurity: Not on file  Transportation Needs:  Not on file  Physical Activity: Not on file  Stress: Not on file  Social Connections: Not on file  Intimate Partner Violence: Not on file    Vital Signs: Blood pressure 135/85, pulse (!) 101, resp. rate 16, height '5\' 9"'$  (1.753 m), weight 214 lb (97.1 kg), SpO2 97 %. Body mass index is 31.6 kg/m.   Examination: General Appearance: The patient is well-developed, well-nourished, and in no distress. Neck Circumference: 36 cm Skin: Gross inspection of skin unremarkable. Head: normocephalic, no gross deformities. Eyes: no gross deformities noted. ENT: ears appear grossly normal Neurologic: Alert and oriented. No involuntary movements.    STOP BANG RISK ASSESSMENT S (snore) Have you been told that you snore?     YES   T (tired) Are you often tired, fatigued, or sleepy during the day?   YES  O (obstruction) Do you stop breathing, choke, or gasp during sleep? YES   P (pressure) Do you have or are you being treated for high blood pressure? NO   B (BMI) Is your body index greater than 35 kg/m? NO   A (age) Are you 10 years old or older? YES   N (neck) Do you have a neck circumference greater than 16 inches?   NO   G (gender)  Are you a male? YES   TOTAL STOP/BANG "YES" ANSWERS 5                                                               A STOP-Bang score of 2 or less is considered low risk, and a score of 5 or more is high risk for having either moderate or severe OSA. For people who score 3 or 4, doctors may need to perform further assessment to determine how likely they are to have OSA.         EPWORTH SLEEPINESS SCALE:  Scale:  (0)= no chance of dozing; (1)= slight chance of dozing; (2)= moderate chance of dozing; (3)= high chance of dozing  Chance  Situtation    Sitting and reading: 3    Watching TV: 3    Sitting Inactive in public: 0    As a passenger in car: 0      Lying down to rest: 2    Sitting and talking: 0    Sitting quielty after lunch: 0    In a car, stopped in traffic: 0   TOTAL SCORE:   8 out of 24    SLEEP STUDIES:  HST (07/06/22) AHI 4, min SPO2 83%  LABS: Recent Results (from the past 2160 hour(s))  Type and screen [place]     Status: None   Collection Time: 11/11/22  2:01 PM  Result Value Ref Range   ABO/RH(D) A POS    Antibody Screen NEG    Sample Expiration 11/25/2022,2359    Extend sample reason      NO TRANSFUSIONS OR PREGNANCY IN THE PAST 3 MONTHS Performed at [facility], [address] [place], [place] [address]   Surgical pcr screen     Status: Abnormal   Collection Time: 11/11/22  2:01 PM   Specimen: Nasal Mucosa; Nasal Swab  Result Value Ref Range   MRSA, PCR NEGATIVE NEGATIVE   Staphylococcus aureus POSITIVE (A)  NEGATIVE    Comment: (NOTE) The Xpert SA Assay (FDA approved for NASAL specimens in patients 38 years of age and older), is one component of a comprehensive surveillance program. It is not intended to diagnose infection nor to guide or monitor treatment. Performed at [facility], [address] [place], [place] [address]   Glucose, capillary     Status: Abnormal   Collection Time:  11/19/22  6:09 AM  Result Value Ref Range   Glucose-Capillary 150 (H) 70 - 99 mg/dL    Comment: Glucose reference range applies only to samples taken after fasting for at least 8 hours.  Glucose, capillary     Status: Abnormal   Collection Time: 11/19/22  9:11 AM  Result Value Ref Range   Glucose-Capillary 152 (H) 70 - 99 mg/dL    Comment: Glucose reference range applies only to samples taken after fasting for at least 8 hours.   Comment 1 Notify RN    Comment 2 Document in Chart   CBG monitoring, ED     Status: None   Collection Time: 12/20/22  1:43 PM  Result Value Ref Range   Glucose-Capillary 96 70 - 99 mg/dL    Comment: Glucose reference range applies only to samples taken after fasting for at least 8 hours.  Basic metabolic panel     Status: Abnormal   Collection Time: 12/20/22  1:58 PM  Result Value Ref Range   Sodium 135 135 - 145 mmol/L   Potassium 4.1 3.5 - 5.1 mmol/L   Chloride 103 98 - 111 mmol/L   CO2 27 22 - 32 mmol/L   Glucose, Bld 109 (H) 70 - 99 mg/dL    Comment: Glucose reference range applies only to samples taken after fasting for at least 8 hours.   BUN 23 (H) 6 - 20 mg/dL   Creatinine, Ser 1.20 0.61 - 1.24 mg/dL   Calcium 9.3 8.9 - 10.3 mg/dL   GFR, Estimated >60 >60 mL/min    Comment: (NOTE) Calculated using the CKD-EPI Creatinine Equation (2021)    Anion gap 5 5 - 15    Comment: Performed at [facility], [address] [place], [place] [address]  CBC     Status: None   Collection Time: 12/20/22  1:58 PM  Result Value Ref Range   WBC 4.3 4.0 - 10.5 K/uL   RBC 5.18 4.22 - 5.81 MIL/uL   Hemoglobin 13.8 13.0 - 17.0 g/dL   HCT 43.5 39.0 - 52.0 %   MCV 84.0 80.0 - 100.0 fL   MCH 26.6 26.0 - 34.0 pg   MCHC 31.7 30.0 - 36.0 g/dL   RDW 14.8 11.5 - 15.5 %   Platelets 172 150 - 400 K/uL   nRBC 0.0 0.0 - 0.2 %    Comment: Performed at [facility], [place], [place] [address]  Troponin I (High Sensitivity)     Status:  None   Collection Time: 12/20/22  1:58 PM  Result Value Ref Range   Troponin I (High Sensitivity) <2 <18 ng/L    Comment: (NOTE) Elevated high sensitivity troponin I (hsTnI) values and significant  changes across serial measurements may suggest ACS but many other  chronic and acute conditions are known to elevate hsTnI results.  Refer to the "Links" section for chest pain algorithms and additional  guidance. Performed at [facility], [address] [place], [place] [address]     Radiology: CT Cervical Spine Wo Contrast  Result Date: 12/20/2022 CLINICAL DATA:  Syncope, hypertension EXAM: CT CERVICAL SPINE WITHOUT CONTRAST TECHNIQUE: Multidetector CT imaging of the cervical spine was performed without intravenous contrast. Multiplanar CT image reconstructions were also generated. RADIATION DOSE REDUCTION: This exam was performed according to the departmental dose-optimization program which includes automated exposure control, adjustment of the mA and/or kV according to patient size and/or use of iterative reconstruction technique. COMPARISON:  11/19/2022, 06/13/2022 FINDINGS: Alignment: Left convex scoliosis centered at the cervicothoracic junction. Otherwise alignment is anatomic. Skull base and vertebrae: No acute fracture. No primary bone lesion or focal pathologic process. Soft tissues and spinal canal: Postsurgical changes within the midline posterior neck consistent with recent surgical intervention. No prevertebral fluid or swelling. No visible canal hematoma. Disc levels: Mild multilevel spondylosis most pronounced at C4-5 and C5-6. Right greater than left neural foraminal narrowing at the C5-6 level. Previous laminotomy on the right at C5. Upper chest: Airway is patent. Calcified granuloma right upper lobe. Otherwise the lung apices are clear. Other: Reconstructed images demonstrate no additional findings. IMPRESSION: 1. No acute cervical spine fracture. 2. Postsurgical changes  as above. 3. Multilevel cervical spondylosis greatest at C5-6. Electronically Signed   By: Randa Ngo M.D.   On: 12/20/2022 16:57   CT Head Wo Contrast  Result Date: 12/20/2022 CLINICAL DATA:  Hypotension, syncope EXAM: CT HEAD WITHOUT CONTRAST TECHNIQUE: Contiguous axial images were obtained from the base of the skull through the vertex without intravenous contrast. RADIATION DOSE REDUCTION: This exam was performed according to the departmental dose-optimization program which includes automated exposure control, adjustment of the mA and/or kV according to patient size and/or use of iterative reconstruction technique. COMPARISON:  04/08/2022 FINDINGS: Brain: No acute infarct or hemorrhage. Lateral ventricles and midline structures are unremarkable. No acute extra-axial fluid collections. No mass effect. Vascular: No hyperdense vessel or unexpected calcification. Skull: Normal. Negative for fracture or focal lesion. Sinuses/Orbits: No acute finding. Other: None. IMPRESSION: 1. No acute intracranial process. Electronically Signed   By: Randa Ngo M.D.   On: 12/20/2022 16:53    No results found.  No results found.    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   Incomplete tear of right rotator cuff 10/15/2017   Tendinitis of upper biceps tendon of right shoulder 10/15/2017   Seborrhea 09/23/2017   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 OSA (obstructive sleep apnea) PLAN OSA:   Patient evaluation suggests high risk of sleep disordered  breathing due to witnessed apnea, gasping, morning headaches, nonrestorative sleep, snoring.  Suggest: PSG to assess/treat the patient's sleep disordered breathing. The patient was also counselled on weight loss to optimize sleep health.  2. Obesity (BMI 30-39.9) Obesity Counseling: Had a lengthy discussion regarding patients BMI and weight issues. Patient was instructed on portion control as well as increased activity. Also discussed caloric restrictions with trying to maintain intake less than 2000 Kcal. Discussions were made in accordance with the 5As of weight management. Simple actions such as not eating late and if able to, taking a walk is suggested.     General Counseling: I have discussed the findings of the evaluation and examination with Shanon Brow.  I have also discussed any further diagnostic evaluation thatmay be needed or ordered today. Shanon Brow verbalizes understanding of the findings of todays visit. We also reviewed his medications today and discussed drug interactions and side effects including but not limited excessive drowsiness and altered mental states. We also discussed that there is always a risk not just to him but also people around him. he has been encouraged to call the office with any questions or concerns  that should arise related to todays visit.  No orders of the defined types were placed in this encounter.       I have personally obtained a history, evaluated the patient, evaluated pertinent data, formulated the assessment and plan and placed orders.   This patient was seen today by Tressie Ellis, PA-C in collaboration with Dr. Devona Konig.   Allyne Gee, MD [facility] Diplomate ABMS Pulmonary and Critical Care Medicine Sleep medicine

## 2023-02-12 ENCOUNTER — Encounter: Payer: Self-pay | Admitting: Neurosurgery

## 2023-02-12 ENCOUNTER — Ambulatory Visit (INDEPENDENT_AMBULATORY_CARE_PROVIDER_SITE_OTHER): Payer: 59 | Admitting: Neurosurgery

## 2023-02-12 VITALS — BP 116/62 | Ht 69.0 in | Wt 214.0 lb

## 2023-02-12 DIAGNOSIS — Z09 Encounter for follow-up examination after completed treatment for conditions other than malignant neoplasm: Secondary | ICD-10-CM

## 2023-02-12 DIAGNOSIS — M5412 Radiculopathy, cervical region: Secondary | ICD-10-CM

## 2023-02-12 NOTE — Progress Notes (Signed)
   REFERRING PHYSICIAN:  Favio, Brezina, Md [address] [place],  [place] [address]  DOS: 11/19/22 Right C5-6 foraminotomy  HISTORY OF PRESENT ILLNESS: Daniel [name] is status post cervical foraminotomy.  His pain is gone     PHYSICAL EXAMINATION:  NEUROLOGICAL:  General: In no acute distress.   Awake, alert, oriented to person, place, and time.  Pupils equal round and reactive to light.  Facial tone is symmetric.  Tongue protrusion is midline.  There is no pronator drift.  Strength: Side Biceps Triceps Deltoid Interossei Grip Wrist Ext. Wrist Flex.  R '5 5 5 5 5 5 5  '$ L '5 5 5 5 5 5 5   '$ Incision c/d/I  Imaging:  No interval imaging to review today  Assessment / Plan: Daniel [name] is doing well after cervical foraminotomy.  I am very pleased with his improvements.  He is off activity limitations.  I will see him back as needed.  Meade Maw MD Dept of Neurosurgery

## 2023-03-17 ENCOUNTER — Ambulatory Visit: Payer: 59 | Admitting: Anesthesiology

## 2023-03-17 ENCOUNTER — Encounter: Payer: Self-pay | Admitting: *Deleted

## 2023-03-17 ENCOUNTER — Other Ambulatory Visit: Payer: Self-pay

## 2023-03-17 ENCOUNTER — Encounter
Admission: RE | Disposition: A | Discharge status: Home / Self Care | Payer: Self-pay | Source: Home / Self Care | Attending: Gastroenterology

## 2023-03-17 ENCOUNTER — Ambulatory Visit
Admission: RE | Admit: 2023-03-17 | Discharge: 2023-03-17 | Disposition: A | Discharge status: Home / Self Care | Payer: 59 | Attending: Gastroenterology | Admitting: Gastroenterology

## 2023-03-17 DIAGNOSIS — Z87891 Personal history of nicotine dependence: Secondary | ICD-10-CM | POA: Insufficient documentation

## 2023-03-17 DIAGNOSIS — E785 Hyperlipidemia, unspecified: Secondary | ICD-10-CM | POA: Insufficient documentation

## 2023-03-17 DIAGNOSIS — Z7984 Long term (current) use of oral hypoglycemic drugs: Secondary | ICD-10-CM | POA: Diagnosis not present

## 2023-03-17 DIAGNOSIS — F319 Bipolar disorder, unspecified: Secondary | ICD-10-CM | POA: Diagnosis not present

## 2023-03-17 DIAGNOSIS — G4730 Sleep apnea, unspecified: Secondary | ICD-10-CM | POA: Insufficient documentation

## 2023-03-17 DIAGNOSIS — K224 Dyskinesia of esophagus: Secondary | ICD-10-CM | POA: Insufficient documentation

## 2023-03-17 DIAGNOSIS — J45909 Unspecified asthma, uncomplicated: Secondary | ICD-10-CM | POA: Insufficient documentation

## 2023-03-17 DIAGNOSIS — K219 Gastro-esophageal reflux disease without esophagitis: Secondary | ICD-10-CM | POA: Insufficient documentation

## 2023-03-17 DIAGNOSIS — E119 Type 2 diabetes mellitus without complications: Secondary | ICD-10-CM | POA: Insufficient documentation

## 2023-03-17 DIAGNOSIS — M1990 Unspecified osteoarthritis, unspecified site: Secondary | ICD-10-CM | POA: Insufficient documentation

## 2023-03-17 DIAGNOSIS — R1310 Dysphagia, unspecified: Secondary | ICD-10-CM | POA: Diagnosis present

## 2023-03-17 HISTORY — PX: ESOPHAGOGASTRODUODENOSCOPY (EGD) WITH PROPOFOL: SHX5813

## 2023-03-17 HISTORY — DX: Sleep apnea, unspecified: G47.30

## 2023-03-17 LAB — GLUCOSE, CAPILLARY: Glucose-Capillary: 126 mg/dL — ABNORMAL HIGH (ref 70–99)

## 2023-03-17 SURGERY — ESOPHAGOGASTRODUODENOSCOPY (EGD) WITH PROPOFOL
Anesthesia: General

## 2023-03-17 MED ORDER — SODIUM CHLORIDE 0.9 % IV SOLN: INTRAVENOUS | Status: DC

## 2023-03-17 MED ORDER — LIDOCAINE HCL (CARDIAC) PF 100 MG/5ML IV SOSY
PREFILLED_SYRINGE | INTRAVENOUS | Status: DC | PRN
  Administered 2023-03-17: 100 mg via INTRAVENOUS

## 2023-03-17 MED ORDER — MIDAZOLAM HCL 2 MG/2ML IJ SOLN
INTRAMUSCULAR | Status: AC
  Filled 2023-03-17: qty 2

## 2023-03-17 MED ORDER — PROPOFOL 10 MG/ML IV BOLUS
INTRAVENOUS | Status: DC | PRN
  Administered 2023-03-17: 60 mg via INTRAVENOUS
  Administered 2023-03-17: 20 mg via INTRAVENOUS

## 2023-03-17 MED ORDER — MIDAZOLAM HCL 2 MG/2ML IJ SOLN
INTRAMUSCULAR | Status: DC | PRN
  Administered 2023-03-17: 2 mg via INTRAVENOUS

## 2023-03-17 MED ORDER — GLYCOPYRROLATE 0.2 MG/ML IJ SOLN
INTRAMUSCULAR | Status: DC | PRN
  Administered 2023-03-17: .2 mg via INTRAVENOUS

## 2023-03-17 MED ORDER — PROPOFOL 500 MG/50ML IV EMUL
INTRAVENOUS | Status: DC | PRN
  Administered 2023-03-17: 145 ug/kg/min via INTRAVENOUS

## 2023-03-17 NOTE — Interval H&P Note (Signed)
History and Physical Interval Note:  03/17/2023 9:12 AM  Daniel [name]  has presented today for surgery, with the diagnosis of dysphagia.  The various methods of treatment have been discussed with the patient and family. After consideration of risks, benefits and other options for treatment, the patient has consented to  Procedure(s): ESOPHAGOGASTRODUODENOSCOPY (EGD) WITH PROPOFOL (N/A) as a surgical intervention.  The patient's history has been reviewed, patient examined, no change in status, stable for surgery.  I have reviewed the patient's chart and labs.  Questions were answered to the patient's satisfaction.     [name]  Ok to proceed with EGD

## 2023-03-17 NOTE — Anesthesia Postprocedure Evaluation (Signed)
Anesthesia Post Note  Patient: Daniel [name]  Procedure(s) Performed: ESOPHAGOGASTRODUODENOSCOPY (EGD) WITH PROPOFOL  Patient location during evaluation: Endoscopy Anesthesia Type: General Level of consciousness: awake and alert Pain management: pain level controlled Vital Signs Assessment: post-procedure vital signs reviewed and stable Respiratory status: spontaneous breathing, nonlabored ventilation and respiratory function stable Cardiovascular status: blood pressure returned to baseline and stable Postop Assessment: no apparent nausea or vomiting Anesthetic complications: no   No notable events documented.   Last Vitals:  Vitals:   03/17/23 0936 03/17/23 0946  BP: 109/77 126/89  Pulse:    Resp:    Temp:    SpO2:      Last Pain:  Vitals:   03/17/23 0946  TempSrc:   PainSc: 0-No pain                 Foye Deer

## 2023-03-17 NOTE — Anesthesia Preprocedure Evaluation (Addendum)
Anesthesia Evaluation  Patient identified by MRN, date of birth, ID band Patient awake    Reviewed: Allergy & Precautions, NPO status , Patient's Chart, lab work & pertinent test results  History of Anesthesia Complications Negative for: history of anesthetic complications  Airway Mallampati: III  TM Distance: >3 FB Neck ROM: Full    Dental no notable dental hx. (+) Dental Advidsory Given, Missing   Pulmonary neg shortness of breath, asthma (mild intermittent) , sleep apnea , former smoker   breath sounds clear to auscultation- rhonchi (-) wheezing      Cardiovascular Exercise Tolerance: Good (-) hypertension(-) CAD, (-) Past MI, (-) Cardiac Stents and (-) CABG  Rhythm:Regular Rate:Normal - Systolic murmurs and - Diastolic murmurs    Neuro/Psych  Headaches, neg Seizures PSYCHIATRIC DISORDERS  Depression Bipolar Disorder      GI/Hepatic Neg liver ROS,GERD  ,,dysphagia   Endo/Other  diabetes, Oral Hypoglycemic Agents    Renal/GU negative Renal ROS     Musculoskeletal  (+) Arthritis ,    Abdominal  (+) - obese  Peds  Hematology negative hematology ROS (+)   Anesthesia Other Findings Past Medical History: No date: Asthma No date: Bipolar 1 disorder (HCC) No date: Chronic back pain No date: DDD (degenerative disc disease), cervical No date: Depression No date: Diabetes mellitus without complication (HCC) No date: ED (erectile dysfunction) No date: Elevated lipids No date: GERD (gastroesophageal reflux disease) No date: Headache No date: Insomnia No date: Migraine No date: Seasonal allergies No date: Wheezing   Reproductive/Obstetrics                             Lab Results  Component Value Date   WBC 4.3 12/20/2022   HGB 13.8 12/20/2022   HCT 43.5 12/20/2022   MCV 84.0 12/20/2022   PLT 172 12/20/2022    Anesthesia Physical Anesthesia Plan  ASA: 2  Anesthesia Plan: General    Post-op Pain Management:    Induction: Intravenous  PONV Risk Score and Plan: 2 and Propofol infusion and TIVA  Airway Management Planned: Natural Airway  Additional Equipment:   Intra-op Plan:   Post-operative Plan:   Informed Consent: I have reviewed the patients History and Physical, chart, labs and discussed the procedure including the risks, benefits and alternatives for the proposed anesthesia with the patient or authorized representative who has indicated his/her understanding and acceptance.     Dental Advisory Given  Plan Discussed with: Anesthesiologist, CRNA and Surgeon  Anesthesia Plan Comments:         Anesthesia Quick Evaluation

## 2023-03-17 NOTE — Transfer of Care (Signed)
Immediate Anesthesia Transfer of Care Note  Patient: Daniel [name]  Procedure(s) Performed: ESOPHAGOGASTRODUODENOSCOPY (EGD) WITH PROPOFOL  Patient Location: Endoscopy Unit  Anesthesia Type:General  Level of Consciousness: drowsy and patient cooperative  Airway & Oxygen Therapy: Patient Spontanous Breathing and Patient connected to face mask oxygen  Post-op Assessment: Report given to RN and Post -op Vital signs reviewed and stable  Post vital signs: Reviewed and stable  Last Vitals:  Vitals Value Taken Time  BP 108/76 03/17/23 0926  Temp 36.2 C 03/17/23 0926  Pulse 86 03/17/23 0927  Resp 17 03/17/23 0927  SpO2 97 % 03/17/23 0927  Vitals shown include unvalidated device data.  Last Pain:  Vitals:   03/17/23 0926  TempSrc: Temporal  PainSc: Asleep         Complications: No notable events documented.

## 2023-03-17 NOTE — H&P (Signed)
Outpatient short stay form Pre-procedure 03/17/2023  Daniel Bill, MD  Primary Physician: Dorothey Baseman, MD  Reason for visit:  Dysphagia  History of present illness:    52 y/o gentleman with history of GERD, HLD, and bipolar here for EGD for dysphagia to solids. History of osteophytes and s/p neck surgery. No blood thinners. No family history of GI malignancies. No smoking or alcohol use. He does have asthma.    Current Facility-Administered Medications:    0.9 %  sodium chloride infusion, , Intravenous, Continuous, Elyce Zollinger, Rossie Muskrat, MD, Last Rate: 20 mL/hr at 03/17/23 0819, Continued from Pre-op at 03/17/23 0819  Medications Prior to Admission  Medication Sig Dispense Refill Last Dose   atorvastatin (LIPITOR) 40 MG tablet Take 40 mg by mouth at bedtime.   03/16/2023   celecoxib (CELEBREX) 200 MG capsule Take 1 capsule by mouth 2 (two) times daily.   03/16/2023   divalproex (DEPAKOTE ER) 250 MG 24 hr tablet Take 250 mg by mouth in the morning and at bedtime.   03/16/2023   DULoxetine (CYMBALTA) 20 MG capsule Take 20 mg by mouth at bedtime.   03/16/2023   empagliflozin (JARDIANCE) 10 MG TABS tablet Take 10 mg by mouth daily.   03/16/2023   emtricitabine-tenofovir AF (DESCOVY) 200-25 MG tablet Take 1 tablet by mouth daily.   03/16/2023   FLUoxetine (PROZAC) 20 MG capsule Take 20 mg by mouth daily.   03/16/2023   gabapentin (NEURONTIN) 300 MG capsule Take 600 mg by mouth 2 (two) times daily.   03/16/2023   Galcanezumab-gnlm (EMGALITY) 120 MG/ML SOAJ Inject 120 mg into the skin every 30 (thirty) days.   03/16/2023   Melatonin 3 MG TABS Take 3 mg by mouth at bedtime.    03/16/2023   montelukast (SINGULAIR) 10 MG tablet Take 10 mg by mouth at bedtime.   03/16/2023   Multiple Vitamin (MULTIVITAMIN WITH MINERALS) TABS tablet Take 1 tablet by mouth daily.   Past Week   nortriptyline (PAMELOR) 50 MG capsule at bedtime. Take 40mg  nightly for one week, then increase to 50mg  nightly   03/16/2023   pantoprazole  (PROTONIX) 40 MG tablet Take 40 mg by mouth 2 (two) times daily.   03/16/2023   propranolol (INDERAL) 10 MG tablet Take 10 mg by mouth 2 (two) times daily.   03/16/2023   sitaGLIPtin (JANUVIA) 100 MG tablet Take 100 mg by mouth daily.   03/16/2023   tiZANidine (ZANAFLEX) 4 MG tablet Take 1 tablet (4 mg total) by mouth 3 (three) times daily. 30 tablet 0 03/16/2023   traZODone (DESYREL) 50 MG tablet Take 50 mg by mouth at bedtime.   03/16/2023   zonisamide (ZONEGRAN) 100 MG capsule Take 100 mg by mouth daily.   03/16/2023   albuterol (PROVENTIL HFA;VENTOLIN HFA) 108 (90 Base) MCG/ACT inhaler Inhale 1 puff into the lungs every 4 (four) hours as needed for wheezing or shortness of breath.       albuterol (PROVENTIL) (2.5 MG/3ML) 0.083% nebulizer solution Take 2.5 mg by nebulization every 4 (four) hours as needed for wheezing.       budesonide (PULMICORT) 1 MG/2ML nebulizer solution INHALE 1 VIAL VIA NEBULIZER ONCE DAILY      Continuous Blood Gluc Sensor (FREESTYLE LIBRE 3 SENSOR) MISC As directedfor glucose monitoring      pseudoephedrine-guaifenesin (MUCINEX D) 60-600 MG 12 hr tablet Take 1 tablet by mouth every 12 (twelve) hours as needed.      rizatriptan (MAXALT) 10 MG tablet May  take a second dose after 2 hours if needed.      tadalafil (CIALIS) 5 MG tablet Take 1 tablet (5 mg total) by mouth daily. 30 tablet 11      Allergies  Allergen Reactions   Doxycycline Nausea And Vomiting    Other reaction(s): Vomiting   Ketorolac Itching    Other reaction(s): Unknown   Omega-3     Other reaction(s): Other (See Comments) Other reaction(s): Unknown Mercaptobenzothiazole   Other Other (See Comments)    Confirmed by allergy testing Mercaptobenzothiazole Other reaction(s): Unknown Other reaction(s): Other (See Comments) Mercaptobenzothiazole  Mercaptobenzothiazole   Parabens Other (See Comments)    Confirmed by allergy testing Other reaction(s): Unknown Other reaction(s): Other (See Comments) Other  reaction(s): Unknown   Tramadol Itching and Other (See Comments)    Tolerates extended release Other reaction(s): Other (See Comments) Other reaction(s): Other (see comments), Other (See Comments) Tolerates extended release Tolerates extended release Tolerates extended release Tolerates extended release Tolerates extended release Tolerates extended release   Penicillins Rash, Other (See Comments) and Hives    Has patient had a PCN reaction causing immediate rash, facial/tongue/throat swelling, SOB or lightheadedness with hypotension: Yes Has patient had a PCN reaction causing severe rash involving mucus membranes or skin necrosis: No Has patient had a PCN reaction that required hospitalization No Has patient had a PCN reaction occurring within the last 10 years: No If all of the above answers are "NO", then may proceed with Cephalosporin use. Other reaction(s): Other Other reaction(s): Other (See Comments) Has patient had a PCN reaction causing immediate rash, facial/tongue/throat swelling, SOB or lightheadedness with hypotension: Yes Has patient had a PCN reaction causing severe rash involving mucus membranes or skin necrosis: No Has patient had a PCN reaction that required hospitalization No Has patient had a PCN reaction occurring within the last 10 years: No If all of the above answers are "NO", then may proceed with Cephalosporin use. Can take keflexCan take keflex Has patient had a PCN reaction causing immediate rash, facial/tongue/throat swelling, SOB or lightheadedness with hypotension: Yes Has patient had a PCN reaction causing severe rash involving mucus membranes or skin necrosis: No Has patient had a PCN reaction that required hospitalization No Has patient had a PCN reaction occurring within the last 10 years: No If all of the above answers are "NO", then may proceed with Cephalosporin use. Other reaction(s): Unknown     Past Medical History:  Diagnosis Date   Asthma     Bipolar 1 disorder    Chronic back pain    DDD (degenerative disc disease), cervical    Depression    Diabetes mellitus without complication    ED (erectile dysfunction)    Elevated lipids    GERD (gastroesophageal reflux disease)    Headache    Insomnia    Migraine    Seasonal allergies    Sleep apnea    Wheezing     Review of systems:  Otherwise negative.    Physical Exam  Gen: Alert, oriented. Appears stated age.  HEENT: PERRLA. Lungs: No respiratory distress CV: RRR Abd: soft, benign, no masses Ext: No edema    Planned procedures: Proceed with EGD. The patient understands the nature of the planned procedure, indications, risks, alternatives and potential complications including but not limited to bleeding, infection, perforation, damage to internal organs and possible oversedation/side effects from anesthesia. The patient agrees and gives consent to proceed.  Please refer to procedure notes for findings, recommendations and patient disposition/instructions.  Lesly Rubenstein, MD [facility]

## 2023-03-17 NOTE — Op Note (Signed)
[facility] Patient Name: Daniel HummingbirdDavid [name] Procedure Date: 03/17/2023 9:13 AM MRN: [id number] Account #: [id number] Date of Birth: June 17, 1971 Admit Type: Outpatient Age: [age] [facility] ROOM 4 Gender: Male Note Status: Finalized Instrument Name: Patton SallesUpper Endoscope [id number] Procedure:             Upper GI endoscopy Indications:           Dysphagia Providers:             Eather Colasameron Jamarion [place] MD, MD Referring MD:          Teena Iraniavid M. Terance HartBronstein, MD (Referring MD) Medicines:             Monitored Anesthesia Care Complications:         No immediate complications. Estimated blood loss:                         Minimal. Procedure:             Pre-Anesthesia Assessment:                        - Prior to the procedure, a History and Physical was                         performed, and patient medications and allergies were                         reviewed. The patient is competent. The risks and                         benefits of the procedure and the sedation options and                         risks were discussed with the patient. All questions                         were answered and informed consent was obtained.                         Patient identification and proposed procedure were                         verified by the physician, the nurse, the                         anesthesiologist, the anesthetist and the technician                         in the endoscopy suite. Mental Status Examination:                         alert and oriented. Airway Examination: normal                         oropharyngeal airway and neck mobility. Respiratory                         Examination: clear to auscultation. CV Examination:  normal. Prophylactic Antibiotics: The patient does not                         require prophylactic antibiotics. Prior                         Anticoagulants: The patient has taken no anticoagulant                         or  antiplatelet agents. ASA Grade Assessment: II - A                         patient with mild systemic disease. After reviewing                         the risks and benefits, the patient was deemed in                         satisfactory condition to undergo the procedure. The                         anesthesia plan was to use monitored anesthesia care                         (MAC). Immediately prior to administration of                         medications, the patient was re-assessed for adequacy                         to receive sedatives. The heart rate, respiratory                         rate, oxygen saturations, blood pressure, adequacy of                         pulmonary ventilation, and response to care were                         monitored throughout the procedure. The physical                         status of the patient was re-assessed after the                         procedure.                        After obtaining informed consent, the endoscope was                         passed under direct vision. Throughout the procedure,                         the patient's blood pressure, pulse, and oxygen                         saturations were monitored continuously. The Endoscope  was introduced through the mouth, and advanced to the                         second part of duodenum. The upper GI endoscopy was                         accomplished without difficulty. The patient tolerated                         the procedure well. Findings:      Abnormal motility was noted in the esophagus. The cricopharyngeus was       abnormal. There is a decrease in motility of the esophageal body. The       distal esophagus/lower esophageal sphincter is spastic, but gives up       passage to the endoscope. Biopsies were obtained from the proximal and       distal esophagus with cold forceps for histology of suspected       eosinophilic esophagitis. Estimated blood loss  was minimal.      Fluid was found in the middle third of the esophagus and in the lower       third of the esophagus.      The entire examined stomach was normal.      The examined duodenum was normal. Impression:            - Abnormal esophageal motility. Very non-specific                         finding.                        - Fluid in the middle third of the esophagus and in                         the lower third of the esophagus.                        - Normal stomach.                        - Normal examined duodenum.                        - Biopsies were taken with a cold forceps for                         evaluation of eosinophilic esophagitis. Recommendation:        - Discharge patient to home.                        - Resume previous diet.                        - Continue present medications.                        - Await pathology results.                        - Perform routine esophageal manometry at appointment  to be scheduled.                        - Return to referring physician as previously                         scheduled. Procedure Code(s):     --- Professional ---                        [id number], Esophagogastroduodenoscopy, flexible,                         transoral; with biopsy, single or multiple Diagnosis Code(s):     --- Professional ---                        K22.4, Dyskinesia of esophagus                        R13.10, Dysphagia, unspecified CPT copyright 2022 American Medical Association. All rights reserved. The codes documented in this report are preliminary and upon coder review may  be revised to meet current compliance requirements. Eather Colas MD, MD 03/17/2023 9:30:04 AM Number of Addenda: 0 Note Initiated On: 03/17/2023 9:13 AM Estimated Blood Loss:  Estimated blood loss was minimal.      [facility]

## 2023-03-18 ENCOUNTER — Encounter: Payer: Self-pay | Admitting: Gastroenterology

## 2023-03-18 LAB — SURGICAL PATHOLOGY

## 2023-03-20 ENCOUNTER — Other Ambulatory Visit: Payer: Self-pay | Admitting: Orthopedic Surgery

## 2023-03-20 DIAGNOSIS — S93492A Sprain of other ligament of left ankle, initial encounter: Secondary | ICD-10-CM

## 2023-03-23 ENCOUNTER — Ambulatory Visit
Admission: RE | Admit: 2023-03-23 | Discharge: 2023-03-23 | Disposition: A | Discharge status: Home / Self Care | Payer: 59 | Source: Ambulatory Visit | Attending: Orthopedic Surgery | Admitting: Orthopedic Surgery

## 2023-03-23 DIAGNOSIS — S93492A Sprain of other ligament of left ankle, initial encounter: Secondary | ICD-10-CM

## 2023-04-28 ENCOUNTER — Other Ambulatory Visit: Payer: Self-pay | Admitting: Podiatry

## 2023-05-06 ENCOUNTER — Encounter: Payer: Self-pay | Admitting: Podiatry

## 2023-05-07 ENCOUNTER — Telehealth: Payer: Self-pay | Admitting: Urology

## 2023-05-07 DIAGNOSIS — E291 Testicular hypofunction: Secondary | ICD-10-CM

## 2023-05-07 DIAGNOSIS — R399 Unspecified symptoms and signs involving the genitourinary system: Secondary | ICD-10-CM

## 2023-05-07 MED ORDER — TADALAFIL 5 MG PO TABS
5.0000 mg | ORAL_TABLET | Freq: Every day | ORAL | 0 refills | Status: DC
Start: 2023-05-07 — End: 2023-05-14

## 2023-05-07 NOTE — Anesthesia Preprocedure Evaluation (Addendum)
Anesthesia Evaluation  Patient identified by MRN, date of birth, ID band Patient awake    Reviewed: Allergy & Precautions, H&P , NPO status , Patient's Chart, lab work & pertinent test results  Airway Mallampati: III  TM Distance: <3 FB Neck ROM: Full    Dental no notable dental hx.    Pulmonary asthma , sleep apnea , former smoker Can't tolerate CPAP   Pulmonary exam normal breath sounds clear to auscultation       Cardiovascular negative cardio ROS Normal cardiovascular exam Rhythm:Regular Rate:Normal     Neuro/Psych  Headaches PSYCHIATRIC DISORDERS  Depression Bipolar Disorder    Neuromuscular disease    GI/Hepatic Neg liver ROS,GERD  ,,Patient reports what sounds like esophageal dysmotility and severe GERD   Endo/Other  diabetes    Renal/GU negative Renal ROS  negative genitourinary   Musculoskeletal  (+) Arthritis ,    Abdominal   Peds negative pediatric ROS (+)  Hematology negative hematology ROS (+)   Anesthesia Other Findings Diabetes mellitus without complication  Bipolar 1 disorder (HCC) Wheezing  Seasonal allergies Elevated lipids  Chronic back pain DDD (degenerative disc disease), cervical  Depression ED (erectile dysfunction)  GERD (gastroesophageal reflux disease) Headache  Insomnia Migraine  Asthma Sleep apnea  Wears hearing aid in both ears    Reproductive/Obstetrics negative OB ROS                             Anesthesia Physical Anesthesia Plan  ASA: 3  Anesthesia Plan: General ETT   Post-op Pain Management:    Induction: Intravenous  PONV Risk Score and Plan:   Airway Management Planned: Oral ETT  Additional Equipment:   Intra-op Plan:   Post-operative Plan: Extubation in OR  Informed Consent: I have reviewed the patients History and Physical, chart, labs and discussed the procedure including the risks, benefits and alternatives for the  proposed anesthesia with the patient or authorized representative who has indicated his/her understanding and acceptance.     Dental Advisory Given  Plan Discussed with: Anesthesiologist, CRNA and Surgeon  Anesthesia Plan Comments: (Patient consented for risks of anesthesia including but not limited to:  - adverse reactions to medications - damage to eyes, teeth, lips or other oral mucosa - nerve damage due to positioning  - sore throat or hoarseness - Damage to heart, brain, nerves, lungs, other parts of body or loss of life  Patient voiced understanding.)       Anesthesia Quick Evaluation

## 2023-05-07 NOTE — Telephone Encounter (Signed)
Patient dropped in office today to request that he get a 90 day prescription for Tadalafil instead of 30 day. Pharmacy is [facility] in [place], [place]. Please advise patient if this can be done.

## 2023-05-07 NOTE — Telephone Encounter (Signed)
RX changed to 90 day supply with refills to last until pt's scheduled appt in August 2024.

## 2023-05-12 NOTE — Discharge Instructions (Signed)
[facility]  POST OPERATIVE INSTRUCTIONS FOR DR. FOWLER AND [facility] PODI[facility]PART[facility]   Take your medication as prescribed.  Pain medication should be taken only as needed.  Keep the dressing clean, dry and intact.  Keep your foot elevated above the heart level for the first 48 hours.  Walking to the bathroom and brief periods of walking are acceptable, unless we have instructed you to be non-weight bearing.  Always wear your post-op shoe when walking.  Always use your crutches if you are to be non-weight bearing.  Do not take a shower. Baths are permissible as long as the foot is kept out of the water.   Every hour you are awake:  Bend your knee 15 times. Flex foot 15 times Massage calf 15 times  Call [facility] ([phone]) if any of the following problems occur: You develop a temperature or fever. The bandage becomes saturated with blood. Medication does not stop your pain. Injury of the foot occurs. Any symptoms of infection including redness, odor, or red streaks running from wound. 

## 2023-05-13 ENCOUNTER — Ambulatory Visit
Admission: RE | Admit: 2023-05-13 | Discharge: 2023-05-13 | Disposition: A | Discharge status: Home / Self Care | Payer: 59 | Attending: Podiatry | Admitting: Podiatry

## 2023-05-13 ENCOUNTER — Other Ambulatory Visit: Payer: Self-pay

## 2023-05-13 ENCOUNTER — Ambulatory Visit: Payer: 59 | Admitting: Anesthesiology

## 2023-05-13 ENCOUNTER — Encounter
Admission: RE | Disposition: A | Discharge status: Home / Self Care | Payer: Self-pay | Source: Home / Self Care | Attending: Podiatry

## 2023-05-13 ENCOUNTER — Encounter: Payer: Self-pay | Admitting: Podiatry

## 2023-05-13 ENCOUNTER — Ambulatory Visit: Payer: Self-pay

## 2023-05-13 DIAGNOSIS — J45909 Unspecified asthma, uncomplicated: Secondary | ICD-10-CM | POA: Insufficient documentation

## 2023-05-13 DIAGNOSIS — Z87891 Personal history of nicotine dependence: Secondary | ICD-10-CM | POA: Diagnosis not present

## 2023-05-13 DIAGNOSIS — M19072 Primary osteoarthritis, left ankle and foot: Secondary | ICD-10-CM | POA: Insufficient documentation

## 2023-05-13 DIAGNOSIS — E119 Type 2 diabetes mellitus without complications: Secondary | ICD-10-CM | POA: Insufficient documentation

## 2023-05-13 DIAGNOSIS — K219 Gastro-esophageal reflux disease without esophagitis: Secondary | ICD-10-CM | POA: Insufficient documentation

## 2023-05-13 DIAGNOSIS — G4730 Sleep apnea, unspecified: Secondary | ICD-10-CM | POA: Diagnosis not present

## 2023-05-13 DIAGNOSIS — F319 Bipolar disorder, unspecified: Secondary | ICD-10-CM | POA: Diagnosis not present

## 2023-05-13 HISTORY — PX: FOOT ARTHRODESIS: SHX1655

## 2023-05-13 HISTORY — DX: Presence of external hearing-aid: Z97.4

## 2023-05-13 SURGERY — FUSION, JOINT, FOOT
Anesthesia: General | Site: Foot | Laterality: Left

## 2023-05-13 MED ORDER — LIDOCAINE HCL (CARDIAC) PF 100 MG/5ML IV SOSY
PREFILLED_SYRINGE | INTRAVENOUS | Status: DC | PRN
  Administered 2023-05-13: 50 mg via INTRAVENOUS

## 2023-05-13 MED ORDER — BUPIVACAINE HCL (PF) 0.25 % IJ SOLN
INTRAMUSCULAR | Status: DC | PRN
  Administered 2023-05-13: 10 mL

## 2023-05-13 MED ORDER — MIDAZOLAM HCL 5 MG/5ML IJ SOLN
INTRAMUSCULAR | Status: DC | PRN
  Administered 2023-05-13: 2 mg via INTRAVENOUS

## 2023-05-13 MED ORDER — SUGAMMADEX SODIUM 200 MG/2ML IV SOLN
INTRAVENOUS | Status: DC | PRN
  Administered 2023-05-13: 200 mg via INTRAVENOUS

## 2023-05-13 MED ORDER — ONDANSETRON HCL 4 MG/2ML IJ SOLN
INTRAMUSCULAR | Status: DC | PRN
  Administered 2023-05-13: 4 mg via INTRAVENOUS

## 2023-05-13 MED ORDER — CEFAZOLIN SODIUM-DEXTROSE 2-4 GM/100ML-% IV SOLN
2.0000 g | INTRAVENOUS | Status: AC
  Administered 2023-05-13: 2 g via INTRAVENOUS

## 2023-05-13 MED ORDER — BUPIVACAINE LIPOSOME 1.3 % IJ SUSP
INTRAMUSCULAR | Status: DC | PRN
  Administered 2023-05-13: 10 mL

## 2023-05-13 MED ORDER — EPHEDRINE SULFATE (PRESSORS) 50 MG/ML IJ SOLN
INTRAMUSCULAR | Status: DC | PRN
  Administered 2023-05-13: 15 mg via INTRAVENOUS
  Administered 2023-05-13: 5 mg via INTRAVENOUS
  Administered 2023-05-13: 10 mg via INTRAVENOUS

## 2023-05-13 MED ORDER — LACTATED RINGERS IV SOLN: INTRAVENOUS | Status: DC

## 2023-05-13 MED ORDER — OXYCODONE-ACETAMINOPHEN 5-325 MG PO TABS: 1.0000 | ORAL_TABLET | Freq: Four times a day (QID) | ORAL | 0 refills | Status: DC | PRN

## 2023-05-13 MED ORDER — OXYCODONE HCL 5 MG PO TABS
10.0000 mg | ORAL_TABLET | ORAL | Status: DC | PRN
  Administered 2023-05-13: 10 mg via ORAL

## 2023-05-13 MED ORDER — FENTANYL CITRATE (PF) 100 MCG/2ML IJ SOLN
INTRAMUSCULAR | Status: DC | PRN
  Administered 2023-05-13: 100 ug via INTRAVENOUS

## 2023-05-13 MED ORDER — ROCURONIUM BROMIDE 100 MG/10ML IV SOLN
INTRAVENOUS | Status: DC | PRN
  Administered 2023-05-13: 40 mg via INTRAVENOUS

## 2023-05-13 MED ORDER — PROPOFOL 10 MG/ML IV BOLUS
INTRAVENOUS | Status: DC | PRN
  Administered 2023-05-13: 150 mg via INTRAVENOUS

## 2023-05-13 MED ORDER — DEXAMETHASONE SODIUM PHOSPHATE 4 MG/ML IJ SOLN
INTRAMUSCULAR | Status: DC | PRN
  Administered 2023-05-13: 8 mg via INTRAVENOUS

## 2023-05-13 SURGICAL SUPPLY — 58 items
APL SKNCLS STERI-STRIP NONHPOA (GAUZE/BANDAGES/DRESSINGS) ×1
BENZOIN TINCTURE PRP APPL 2/3 (GAUZE/BANDAGES/DRESSINGS) ×1 IMPLANT
BIT DRILL 2 FENESTRATED (MISCELLANEOUS) IMPLANT
BIT DRILLL 2 FENESTRATED (MISCELLANEOUS) ×1
BLADE MED AGGRESSIVE (BLADE) IMPLANT
BLADE OSC/SAGITTAL MD 5.5X18 (BLADE) IMPLANT
BLADE SURG 15 STRL LF DISP TIS (BLADE) IMPLANT
BLADE SURG 15 STRL SS (BLADE)
BNDG CMPR 5X4 CHSV STRCH STRL (GAUZE/BANDAGES/DRESSINGS) ×1
BNDG CMPR 75X41 PLY HI ABS (GAUZE/BANDAGES/DRESSINGS) ×1
BNDG CMPR STD VLCR NS LF 5.8X4 (GAUZE/BANDAGES/DRESSINGS) ×1
BNDG COHESIVE 4X5 TAN STRL LF (GAUZE/BANDAGES/DRESSINGS) ×1 IMPLANT
BNDG ELASTIC 4X5.8 VLCR NS LF (GAUZE/BANDAGES/DRESSINGS) ×1 IMPLANT
BNDG ESMARCH 4 X 12 STRL LF (GAUZE/BANDAGES/DRESSINGS) ×1
BNDG ESMARCH 4X12 STRL LF (GAUZE/BANDAGES/DRESSINGS) ×1 IMPLANT
BNDG GAUZE DERMACEA FLUFF 4 (GAUZE/BANDAGES/DRESSINGS) ×1 IMPLANT
BNDG GZE DERMACEA 4 6PLY (GAUZE/BANDAGES/DRESSINGS) ×1
BNDG STRETCH 4X75 STRL LF (GAUZE/BANDAGES/DRESSINGS) ×1 IMPLANT
BOOT STEPPER DURA MED (SOFTGOODS) IMPLANT
CANISTER SUCT 1200ML W/VALVE (MISCELLANEOUS) ×1 IMPLANT
COVER LIGHT HANDLE UNIVERSAL (MISCELLANEOUS) ×2 IMPLANT
CUFF TOURN SGL QUICK 18X4 (TOURNIQUET CUFF) IMPLANT
DRAPE FLUOR MINI C-ARM 54X84 (DRAPES) ×1 IMPLANT
DURAPREP 26ML APPLICATOR (WOUND CARE) ×1 IMPLANT
ELECT REM PT RETURN 9FT ADLT (ELECTROSURGICAL) ×1
ELECTRODE REM PT RTRN 9FT ADLT (ELECTROSURGICAL) ×1 IMPLANT
GAUZE SPONGE 4X4 12PLY STRL (GAUZE/BANDAGES/DRESSINGS) ×1 IMPLANT
GAUZE XEROFORM 1X8 LF (GAUZE/BANDAGES/DRESSINGS) ×1 IMPLANT
GLOVE SRG 8 PF TXTR STRL LF DI (GLOVE) ×2 IMPLANT
GLOVE SURG ENC MOIS LTX SZ7.5 (GLOVE) ×2 IMPLANT
GLOVE SURG UNDER POLY LF SZ8 (GLOVE) ×2
GOWN SPEC L4 XLG W/TWL (GOWN DISPOSABLE) ×1 IMPLANT
GOWN STRL REUS W/ TWL LRG LVL3 (GOWN DISPOSABLE) ×2 IMPLANT
GOWN STRL REUS W/TWL LRG LVL3 (GOWN DISPOSABLE) ×2
K-WIRE DBL END TROCAR 6X.045 (WIRE)
K-WIRE DBL END TROCAR 6X.062 (WIRE)
K-WIRE SMOOTH TROCAR 2.0X150 (WIRE) ×2
KIT PROCEDURE DRILL (DRILL) IMPLANT
KIT TURNOVER KIT A (KITS) ×1 IMPLANT
KWIRE DBL END TROCAR 6X.045 (WIRE) IMPLANT
KWIRE DBL END TROCAR 6X.062 (WIRE) IMPLANT
KWIRE SMOOTH TROCAR 2.0X150 (WIRE) IMPLANT
NS IRRIG 500ML POUR BTL (IV SOLUTION) ×1 IMPLANT
PACK EXTREMITY ARMC (MISCELLANEOUS) ×1 IMPLANT
RASP SM TEAR CROSS CUT (RASP) IMPLANT
STAPLE DYNACLIP 18X18X18 (Staple) IMPLANT
STOCKINETTE IMPERVIOUS LG (DRAPES) ×1 IMPLANT
STRIP CLOSURE SKIN 1/4X4 (GAUZE/BANDAGES/DRESSINGS) ×1 IMPLANT
SUT MNCRL 4-0 (SUTURE) ×1
SUT MNCRL 4-0 27XMFL (SUTURE) ×1
SUT MNCRL+ 5-0 UNDYED PC-3 (SUTURE) IMPLANT
SUT VIC AB 2-0 SH 27 (SUTURE)
SUT VIC AB 2-0 SH 27XBRD (SUTURE) IMPLANT
SUT VIC AB 3-0 SH 27 (SUTURE)
SUT VIC AB 3-0 SH 27X BRD (SUTURE) IMPLANT
SUT VIC AB 4-0 SH 27 (SUTURE)
SUT VIC AB 4-0 SH 27XANBCTRL (SUTURE) IMPLANT
SUTURE MNCRL 4-0 27XMF (SUTURE) ×1 IMPLANT

## 2023-05-13 NOTE — H&P (Signed)
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  The various methods of treatment have been discussed with the patient.  No guarantees were given.  After consideration of risks, benefits and other options for treatment, the patient has consented to surgery.  I have reviewed the patients' chart and labs.     A history and physical examination was performed in my office.  The patient was reexamined.  There have been no changes to this history and physical examination.  Daniel [name] A

## 2023-05-13 NOTE — Op Note (Signed)
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  Arthrodesis lateral naviculocuneiform joint 2.  Intraoperative fluoroscopy without assistance of radiologist    EBL: Minimal    Anesthesia:local and general.  Local consisted of a one-to-one mixture of 0.25% bupivacaine plain and Exparel long-acting anesthetic.  A total of 20 cc was used    Hemostasis: [place]f tourniquet inflated to 200 mmHg for approximately 60 minutes    Specimen: None    Complications: None    Operative indications:Daniel [name] is an 52 y.o. that presents today for surgical intervention.  The risks/benefits/alternatives/complications have been discussed and consent has been given.    Procedure:  Patient was brought into the OR and placed on the operating table in thesupine position. After anesthesia was obtained theleft lower extremity was prepped and draped in usual sterile fashion.  Attention was directed to the dorsal lateral aspect of the left midfoot where under the fluoroscopy the lateral navicular cuneiform area was mapped out.  Sharp and blunt dissection carried down through the superficial and deeper tissues to the periosteum.  Subperiosteal dissection was performed.  Fluoroscopy was then used to isolate the lateral naviculocuneiform joint.  All articular cartilage was removed with a combination of curettage and osteotome.  The wound was flushed with copious amounts of irrigation.  The subchondral bone was then drilled with a 2.0 mm drill bit.  Compression was applied and a 18 mm x 18 mm compression staple was placed across the navicular cuneiform fusion site.  Good stability and alignment was noted in all planes.  At this time the wound was flushed with copious amounts of irrigation.  Layered closure was then performed with a 3-0 Vicryl for the deeper tissue.  4-0 Vicryl for the subcutaneous  tissue and a 4-0 Monocryl for the skin.  A bulky sterile dressing was applied and the patient was then placed in a tall equalizer walker boot for nonweightbearing.    Patient tolerated the procedure and anesthesia well.  Was transported from the OR to the PACU with all vital signs stable and vascular status intact. To be discharged per routine protocol.  Will follow up in approximately 1 week in the outpatient clinic.

## 2023-05-13 NOTE — Anesthesia Postprocedure Evaluation (Signed)
Anesthesia Post Note  Patient: Daniel [name]  Procedure(s) Performed: ARTHRODESIS FOOT (Left: Foot)  Patient location during evaluation: PACU Anesthesia Type: General Level of consciousness: awake and alert Pain management: pain level controlled Vital Signs Assessment: post-procedure vital signs reviewed and stable Respiratory status: spontaneous breathing, nonlabored ventilation, respiratory function stable and patient connected to nasal cannula oxygen Cardiovascular status: blood pressure returned to baseline and stable Postop Assessment: no apparent nausea or vomiting Anesthetic complications: no   No notable events documented.   Last Vitals:  Vitals:   05/13/23 1351 05/13/23 1356  BP: 127/86   Pulse: 86 84  Resp: 18 17  Temp:  36.8 C  SpO2: 95% 94%    Last Pain:  Vitals:   05/13/23 1430  TempSrc:   PainSc: 2                  Alaiah Lundy C Darenda Fike

## 2023-05-13 NOTE — Anesthesia Procedure Notes (Signed)
Procedure Name: Intubation Date/Time: 05/13/2023 12:30 PM  Performed by: Domenic Moras, CRNAPre-anesthesia Checklist: Patient identified, Emergency Drugs available, Suction available and Patient being monitored Patient Re-evaluated:Patient Re-evaluated prior to induction Oxygen Delivery Method: Circle system utilized Preoxygenation: Pre-oxygenation with 100% oxygen Induction Type: IV induction Ventilation: Mask ventilation without difficulty Laryngoscope Size: Mac and 3 Grade View: Grade II Tube type: Oral Tube size: 7.5 mm Number of attempts: 1 Airway Equipment and Method: Stylet and Oral airway Placement Confirmation: ETT inserted through vocal cords under direct vision, positive ETCO2 and breath sounds checked- equal and bilateral Secured at: 20 cm Tube secured with: Tape Dental Injury: Teeth and Oropharynx as per pre-operative assessment

## 2023-05-13 NOTE — Transfer of Care (Signed)
Immediate Anesthesia Transfer of Care Note  Patient: Daniel [name]  Procedure(s) Performed: ARTHRODESIS FOOT (Left: Foot)  Patient Location: PACU  Anesthesia Type: General ETT  Level of Consciousness: awake, alert  and patient cooperative  Airway and Oxygen Therapy: Patient Spontanous Breathing and Patient connected to supplemental oxygen  Post-op Assessment: Post-op Vital signs reviewed, Patient's Cardiovascular Status Stable, Respiratory Function Stable, Patent Airway and No signs of Nausea or vomiting  Post-op Vital Signs: Reviewed and stable  Complications: No notable events documented.

## 2023-05-14 ENCOUNTER — Other Ambulatory Visit: Payer: Self-pay

## 2023-05-14 ENCOUNTER — Encounter: Payer: Self-pay | Admitting: Podiatry

## 2023-05-14 DIAGNOSIS — R399 Unspecified symptoms and signs involving the genitourinary system: Secondary | ICD-10-CM

## 2023-05-14 DIAGNOSIS — E291 Testicular hypofunction: Secondary | ICD-10-CM

## 2023-05-14 MED ORDER — TADALAFIL 5 MG PO TABS
5.0000 mg | ORAL_TABLET | Freq: Every day | ORAL | 0 refills | Status: DC
Start: 2023-05-14 — End: 2023-07-23

## 2023-05-14 NOTE — Telephone Encounter (Signed)
Called [facility] unable to speak with anyone. Per website RX must include pt's email address for pt notification. RX re-sent with this information.

## 2023-05-15 ENCOUNTER — Encounter: Payer: Self-pay | Admitting: Podiatry

## 2023-05-26 ENCOUNTER — Encounter: Payer: Self-pay | Admitting: Emergency Medicine

## 2023-05-26 ENCOUNTER — Ambulatory Visit
Admission: EM | Admit: 2023-05-26 | Discharge: 2023-05-26 | Disposition: A | Discharge status: Home / Self Care | Payer: 59 | Attending: Urgent Care | Admitting: Urgent Care

## 2023-05-26 DIAGNOSIS — S61210A Laceration without foreign body of right index finger without damage to nail, initial encounter: Secondary | ICD-10-CM | POA: Diagnosis not present

## 2023-05-26 NOTE — ED Triage Notes (Signed)
Cut index finger on right hand on glass around an hour prior to arrival. Did not clean wound, applied paper towel and came to urgent care. Bleeding well controlled. Denies numbness, tingling, weakness to finger, denies foreign body involvement.

## 2023-05-26 NOTE — ED Provider Notes (Signed)
Daniel [name]    CSN: [id number] Arrival date & time: 05/26/23  1734      History   Chief Complaint Chief Complaint  Patient presents with   Hand Problem    Cue right finger on glass - Entered by patient    HPI Daniel [name] is a 52 y.o. male.   HPI  Presents to urgent care with complaint of laceration to right index finger which occurred when he cut his hand on some broken glass.  Patient states he did not clean wound but applied a paper towel and came directly to urgent care.  Denies numbness, tingling, weakness to finger.  Denies foreign body involvement.  Past Medical History:  Diagnosis Date   Asthma    Bipolar 1 disorder (HCC)    Chronic back pain    DDD (degenerative disc disease), cervical    Depression    Diabetes mellitus without complication (HCC)    ED (erectile dysfunction)    Elevated lipids    GERD (gastroesophageal reflux disease)    Headache    Insomnia    Migraine    1-2x/month since starting emgality   Seasonal allergies    Sleep apnea    Wears hearing aid in both ears    Wheezing     Patient Active Problem List   Diagnosis Date Noted   OSA (obstructive sleep apnea) 02/02/2023   Obesity (BMI 30-39.9) 02/02/2023   Cervical radiculopathy 11/19/2022   Bipolar II disorder, mild, hypomanic, with mixed features, in full remission (HCC) 09/30/2022   Photophobia 10/28/2021   S/P revision of total knee, 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   Elevated blood-pressure reading, without diagnosis of hypertension 01/30/2021   Left arm pain 11/27/2020   Nausea 11/27/2020   Headache disorder 03/30/2020   Status post total knee replacement using cement, 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   Lower urinary tract symptoms (LUTS) 05/13/2019   Recurrent bronchospasm 03/15/2019   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   Incomplete tear of right rotator cuff 10/15/2017   Tendinitis of upper biceps tendon of right shoulder 10/15/2017   Seborrhea 09/23/2017   Degenerative joint disease of right  acromioclavicular joint 09/09/2017   Rotator cuff tendinitis, right 09/09/2017   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   Displacement of lumbar intervertebral disc without myelopathy 04/04/2015   Facet arthritis of lumbar region 03/14/2015   DDD (degenerative disc disease), lumbar 01/02/2015   Lumbar radiculitis 01/02/2015   DM2 (diabetes mellitus, type 2) (HCC) 10/30/2013   Knee joint effusion 03/14/2013   Left knee pain 03/14/2013   Articular cartilage disorder, other specified site 11/09/2012   Chronic back pain 09/10/2012    Past Surgical History:  Procedure Laterality Date   CHOLECYSTECTOMY     COLONOSCOPY W/ BIOPSIES     ESOPHAGOGASTRODUODENOSCOPY (EGD) WITH PROPOFOL N/A 03/17/2023   Procedure: ESOPHAGOGASTRODUODENOSCOPY (EGD) WITH PROPOFOL;  Surgeon: [name], MD;  Location: [facility];  Service: Endoscopy;  Laterality: N/A;   FOOT ARTHRODESIS Left 05/13/2023   Procedure: ARTHRODESIS FOOT;  Surgeon: [name], DPM;  Location: [facility];  Service: Podiatry;  Laterality: Left;  Diabetic   FRACTURE SURGERY     tail bone   knee arthroscpy Right    POSTERIOR CERVICAL LAMINECTOMY Right 11/19/2022   Procedure: RIGHT C5-6 FORAMINOTOMY;  Surgeon: Venetia Night, MD;  Location: [facility];  Service: Neurosurgery;  Laterality: Right;   SHOULDER ARTHROSCOPY WITH DISTAL CLAVICLE RESECTION Right 10/13/2017   Procedure: SHOULDER ARTHROSCOPY WITH DISTAL CLAVICLE RESECTION;  Surgeon: Christena Flake,  MD;  Location: [facility];  Service: Orthopedics;  Laterality: Right;   SHOULDER ARTHROSCOPY WITH OPEN ROTATOR CUFF REPAIR Right 10/13/2017   Procedure: SHOULDER ARTHROSCOPY WITH OPEN ROTATOR CUFF REPAIR/;  Surgeon: Christena Flake, MD;  Location: [facility];  Service: Orthopedics;  Laterality: Right;   TOTAL KNEE ARTHROPLASTY Right 12/06/2019   Procedure: TOTAL KNEE ARTHROPLASTY;  Surgeon: Kennedy Bucker, MD;  Location: [facility];  Service: Orthopedics;   Laterality: Right;   TOTAL KNEE ARTHROPLASTY Right 05/14/2021   Procedure: REVISION TOTAL KNEE ARTHROPLASTY - [place] Neighbor to Assist;  Surgeon: Kennedy Bucker, MD;  Location: [facility];  Service: Orthopedics;  Laterality: Right;       Home Medications    Prior to Admission medications   Medication Sig Start Date End Date Taking? Authorizing Provider  albuterol (PROVENTIL HFA;VENTOLIN HFA) 108 (90 Base) MCG/ACT inhaler Inhale 1 puff into the lungs every 4 (four) hours as needed for wheezing or shortness of breath.     [provider]  albuterol (PROVENTIL) (2.5 MG/3ML) 0.083% nebulizer solution Take 2.5 mg by nebulization every 4 (four) hours as needed for wheezing.     [provider]  atorvastatin (LIPITOR) 40 MG tablet Take 40 mg by mouth at bedtime. 05/06/22 05/06/23  [provider]  budesonide (PULMICORT) 1 MG/2ML nebulizer solution INHALE 1 VIAL VIA NEBULIZER ONCE DAILY 05/26/22   [provider]  celecoxib (CELEBREX) 200 MG capsule Take 1 capsule by mouth 2 (two) times daily. 06/23/22   [provider]  Continuous Blood Gluc Sensor (FREESTYLE LIBRE 3 SENSOR) MISC As directedfor glucose monitoring 05/12/22   [provider]  divalproex (DEPAKOTE ER) 250 MG 24 hr tablet Take 250 mg by mouth in the morning and at bedtime.    [provider]  DULoxetine (CYMBALTA) 20 MG capsule Take 20 mg by mouth at bedtime.    [provider]  empagliflozin (JARDIANCE) 10 MG TABS tablet Take 10 mg by mouth daily.    [provider]  emtricitabine-tenofovir AF (DESCOVY) 200-25 MG tablet Take 1 tablet by mouth daily. 11/15/20   [provider]  FLUoxetine (PROZAC) 20 MG capsule Take 20 mg by mouth daily.    [provider]  gabapentin (NEURONTIN) 300 MG capsule Take 600 mg by mouth 2 (two) times daily.    [provider]  Galcanezumab-gnlm (EMGALITY) 120 MG/ML SOAJ Inject 120 mg into the skin every 30 (thirty)  days.    [provider]  Melatonin 3 MG TABS Take 3 mg by mouth at bedtime.     [provider]  montelukast (SINGULAIR) 10 MG tablet Take 10 mg by mouth at bedtime.    [provider]  Multiple Vitamin (MULTIVITAMIN WITH MINERALS) TABS tablet Take 1 tablet by mouth daily.    [provider]  nortriptyline (PAMELOR) 50 MG capsule at bedtime. Take 40mg  nightly for one week, then increase to 50mg  nightly 06/26/22   [provider]  oxyCODONE-acetaminophen (PERCOCET) 5-325 MG tablet Take 1-2 tablets by mouth every 6 (six) hours as needed for severe pain. Max 6 tabs per day 05/13/23   [name], DPM  pantoprazole (PROTONIX) 40 MG tablet Take 40 mg by mouth 2 (two) times daily.    [provider]  propranolol (INDERAL) 10 MG tablet Take 10 mg by mouth 2 (two) times daily.    [provider]  pseudoephedrine-guaifenesin (MUCINEX D) 60-600 MG 12 hr tablet Take 1 tablet by mouth every 12 (twelve) hours as  needed. 09/29/22   [provider]  rizatriptan (MAXALT) 10 MG tablet May take a second dose after 2 hours if needed. 06/26/22   [provider]  sitaGLIPtin (JANUVIA) 100 MG tablet Take 100 mg by mouth daily. 11/04/22 11/04/23  [provider]  tadalafil (CIALIS) 5 MG tablet Take 1 tablet (5 mg total) by mouth daily. 05/14/23   Sondra Come, MD  tiZANidine (ZANAFLEX) 4 MG tablet Take 1 tablet (4 mg total) by mouth 3 (three) times daily. 11/19/22   Susanne Borders, PA  traZODone (DESYREL) 50 MG tablet Take 50 mg by mouth at bedtime. Patient not taking: Reported on 05/13/2023    [provider]  zonisamide (ZONEGRAN) 100 MG capsule Take 100 mg by mouth daily. 06/26/22   [provider]  famotidine (PEPCID) 20 MG tablet Take 1 tablet (20 mg total) by mouth 2 (two) times daily. 03/05/18 06/01/20  Merrily Brittle, MD    Family History Family History  Problem Relation Age of Onset   Diabetes Mother     Hypertension Mother    Stroke Father     Social History Social History   Tobacco Use   Smoking status: Former    Packs/day: .5    Types: Cigarettes    Quit date: 11/28/2014    Years since quitting: 8.4   Smokeless tobacco: Never  Vaping Use   Vaping Use: Never used  Substance Use Topics   Alcohol use: No    Alcohol/week: 0.0 standard drinks of alcohol   Drug use: No     Allergies   Doxycycline, Ketorolac, Omega-3, Other, Parabens, Tramadol, and Penicillins   Review of Systems Review of Systems   Physical Exam Triage Vital Signs ED Triage Vitals [05/26/23 1822]  Enc Vitals Group     BP (!) 154/78     Pulse Rate (!) 103     Resp 16     Temp 98.8 F (37.1 C)     Temp Source Oral     SpO2 92 %     Weight      Height      Head Circumference      Peak Flow      Pain Score 8     Pain Loc      Pain Edu?      Excl. in GC?    No data found.  Updated Vital Signs BP (!) 154/78 (BP Location: Left Arm)   Pulse (!) 103   Temp 98.8 F (37.1 C) (Oral)   Resp 16   SpO2 92%   Visual Acuity Right Eye Distance:   Left Eye Distance:   Bilateral Distance:    Right Eye Near:   Left Eye Near:    Bilateral Near:     Physical Exam Vitals reviewed.  Constitutional:      Appearance: Normal appearance.  Musculoskeletal:       Hands:  Skin:    General: Skin is warm and dry.     Findings: Wound present.  Neurological:     General: No focal deficit present.     Mental Status: He is alert and oriented to person, place, and time.  Psychiatric:        Mood and Affect: Mood normal.        Behavior: Behavior normal.      UC Treatments / Results  Labs (all labs ordered are listed, but only abnormal results are displayed) Labs Reviewed - No data to display  EKG  Radiology No results found.  Procedures Laceration Repair  Date/Time: 05/26/2023 7:01 PM  Performed by: Charma Igo, FNP Authorized by: Charma Igo, FNP   Consent:     Consent obtained:  Verbal   Consent given by:  Patient   Risks discussed:  Infection, pain and poor cosmetic result Universal protocol:    Procedure explained and questions answered to patient or proxy's satisfaction: yes     Relevant documents present and verified: yes     Test results available: no     Imaging studies available: no     Required blood products, implants, devices, and special equipment available: no     Site/side marked: no     Immediately prior to procedure, a time out was called: yes     Patient identity confirmed:  Verbally with patient Anesthesia:    Anesthesia method:  Local infiltration   Local anesthetic:  Lidocaine 1% WITH epi Laceration details:    Location:  Finger   Finger location:  R index finger   Length (cm):  0.5   Depth (mm):  1 Exploration:    Limited defect created (wound extended): no     Hemostasis achieved with:  Direct pressure   Imaging outcome: foreign body not noted     Wound exploration: wound explored through full range of motion     Contaminated: no   Treatment:    Area cleansed with:  Povidone-iodine and Shur-Clens   Amount of cleaning:  Standard   Debridement:  None   Undermining:  None Skin repair:    Repair method:  Sutures   Suture size:  4-0   Suture material:  Prolene   Suture technique:  Simple interrupted   Number of sutures:  4 Approximation:    Approximation:  Close Repair type:    Repair type:  Simple Post-procedure details:    Dressing:  Antibiotic ointment and non-adherent dressing   Procedure completion:  Tolerated Comments:     Instructions for wound care provided to patient.  (including critical care time)  Medications Ordered in UC Medications - No data to display  Initial Impression / Assessment and Plan / UC Course  I have reviewed the triage vital signs and the nursing notes.  Pertinent labs & imaging results that were available during my care of the patient were reviewed by me and considered in  my medical decision making (see chart for details).   Right index finger laceration repaired with 4 sutures see procedure note.   Final Clinical Impressions(s) / UC Diagnoses   Final diagnoses:  None   Discharge Instructions   None    ED Prescriptions   None    PDMP not reviewed this encounter.   Charma Igo, [place] 05/26/23 1903

## 2023-05-26 NOTE — Discharge Instructions (Addendum)
Keep wound covered x 1 day.  Keep wound dry x 3 days.  Okay to shower and wash gently but do not bathe or soak hand.  Return in 7 to 10 days for suture removal.

## 2023-06-02 ENCOUNTER — Ambulatory Visit
Admission: RE | Admit: 2023-06-02 | Discharge: 2023-06-02 | Disposition: A | Discharge status: Home / Self Care | Payer: 59 | Source: Ambulatory Visit | Attending: Emergency Medicine | Admitting: Emergency Medicine

## 2023-06-02 DIAGNOSIS — Z4802 Encounter for removal of sutures: Secondary | ICD-10-CM | POA: Diagnosis not present

## 2023-06-02 NOTE — ED Triage Notes (Signed)
Patient presents for suture removal. Patient has 4 sutures present to his right index finger that were placed at this facility 6/18.   Injury well-healed.

## 2023-07-23 ENCOUNTER — Ambulatory Visit (INDEPENDENT_AMBULATORY_CARE_PROVIDER_SITE_OTHER): Payer: 59 | Admitting: Urology

## 2023-07-23 ENCOUNTER — Encounter: Payer: Self-pay | Admitting: Urology

## 2023-07-23 VITALS — BP 99/67 | HR 108 | Ht 69.0 in | Wt 225.1 lb

## 2023-07-23 DIAGNOSIS — R399 Unspecified symptoms and signs involving the genitourinary system: Secondary | ICD-10-CM

## 2023-07-23 DIAGNOSIS — E291 Testicular hypofunction: Secondary | ICD-10-CM | POA: Diagnosis not present

## 2023-07-23 DIAGNOSIS — Z125 Encounter for screening for malignant neoplasm of prostate: Secondary | ICD-10-CM

## 2023-07-23 DIAGNOSIS — N529 Male erectile dysfunction, unspecified: Secondary | ICD-10-CM | POA: Diagnosis not present

## 2023-07-23 LAB — BLADDER SCAN AMB NON-IMAGING

## 2023-07-23 MED ORDER — TADALAFIL 5 MG PO TABS
5.0000 mg | ORAL_TABLET | Freq: Every day | ORAL | 3 refills | Status: DC
Start: 2023-07-23 — End: 2024-07-05

## 2023-07-23 NOTE — Progress Notes (Signed)
   07/23/2023 2:44 PM   [name] August 20, 1971 [id number]  Reason for visit: Follow up urinary symptoms, ED, PSA screening, history of low testosterone  HPI: 52 year old male with bipolar disorder previously followed at [facility] urology.  He has a family history of nonlethal prostate cancer in his father treated with surgery and radiation.  He previously was on testosterone, but this was discontinued around 2022 secondary to elevated hematocrit.  ED currently very well-managed with 5 mg daily Cialis.  This also has improved his urinary symptoms and he denies any urinary complaints, has been able to discontinue alfuzosin since starting the daily Cialis.  He denies any incontinence, gross hematuria, or dysuria.  PVR today is normal at 20ml.  Most recent PSA from August 2023 was normal at 1.5, will screen every other year per the guideline recommendations.  Next due for PSA August 2025.  Cialis 5 mg daily for ED and BPH refilled RTC 1 year PSA prior, PVR  Sondra Come, MD  [facility] [address] [place], [place] [address] [phone]

## 2023-08-15 IMAGING — CR DG CHEST 2V
2 series · 2 of 2 positions shown · non-contrast
Comparison: 03/05/2018

CLINICAL DATA: Post covid x 1 week with congestion, cough and sob,
r/o pneumonia

EXAM:
CHEST - 2 VIEW

[chest pa]
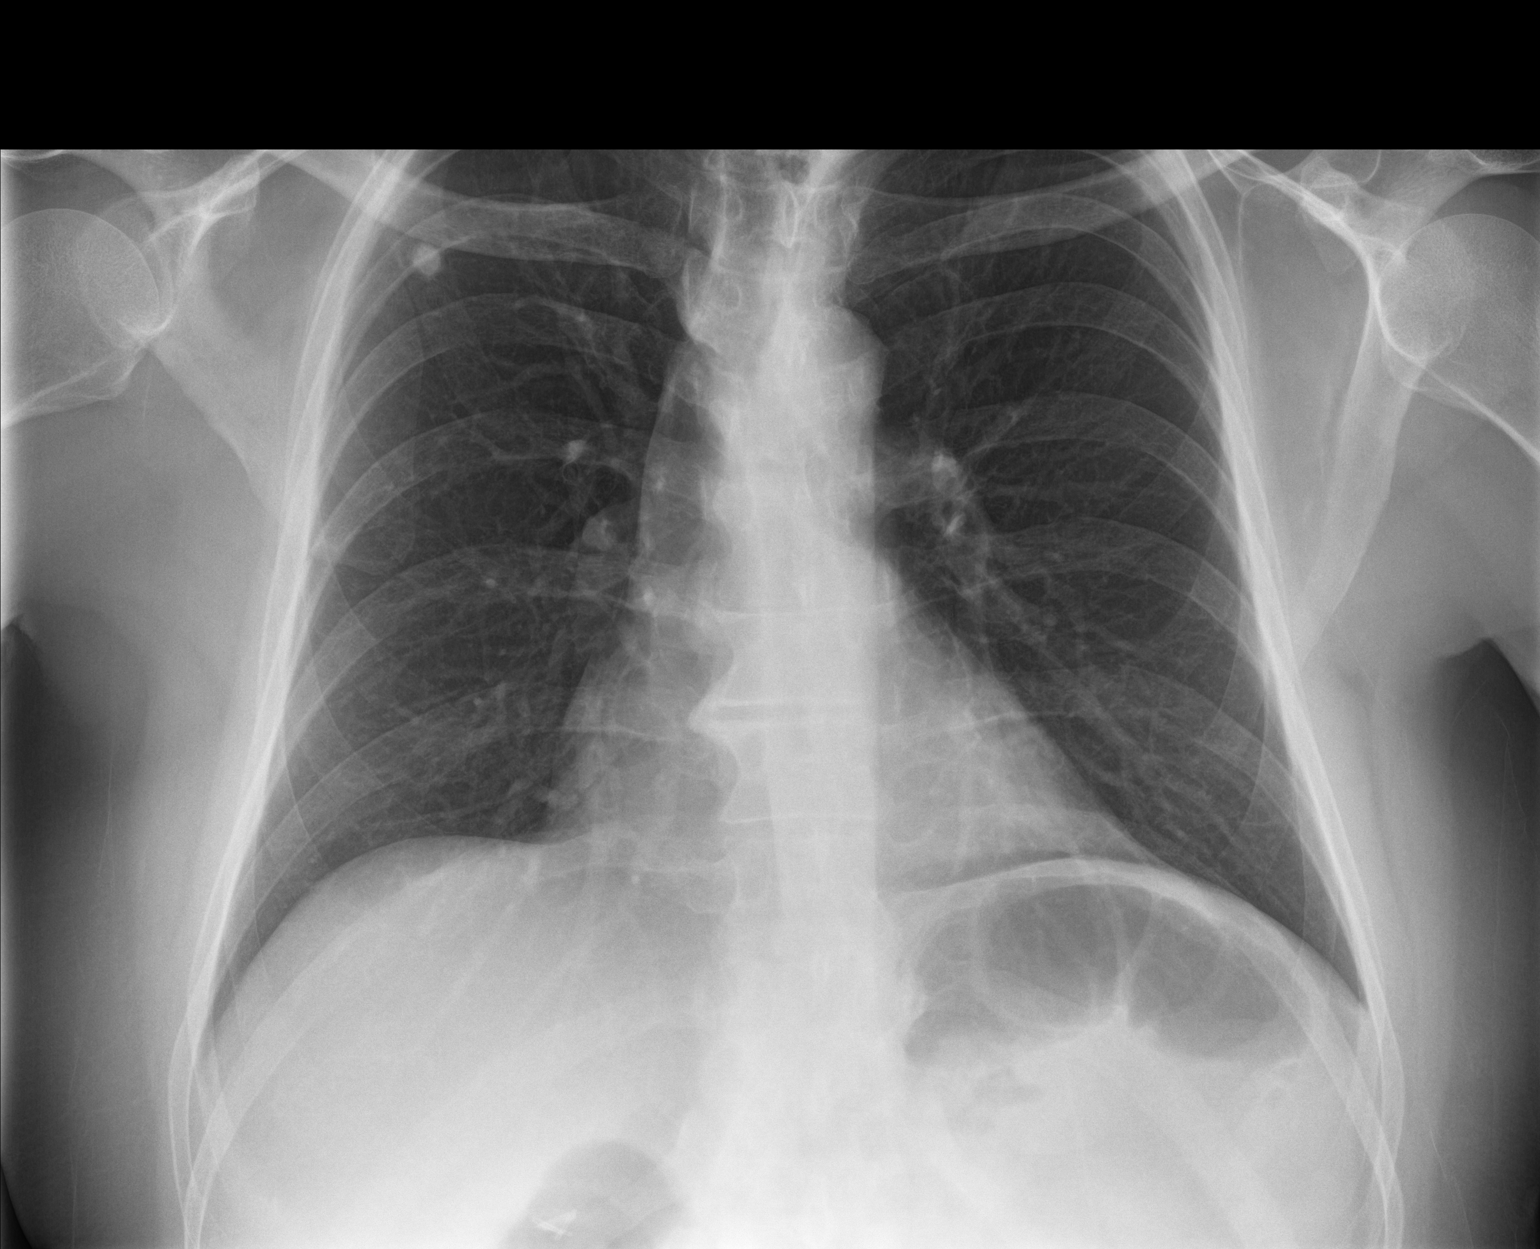

[chest lat]
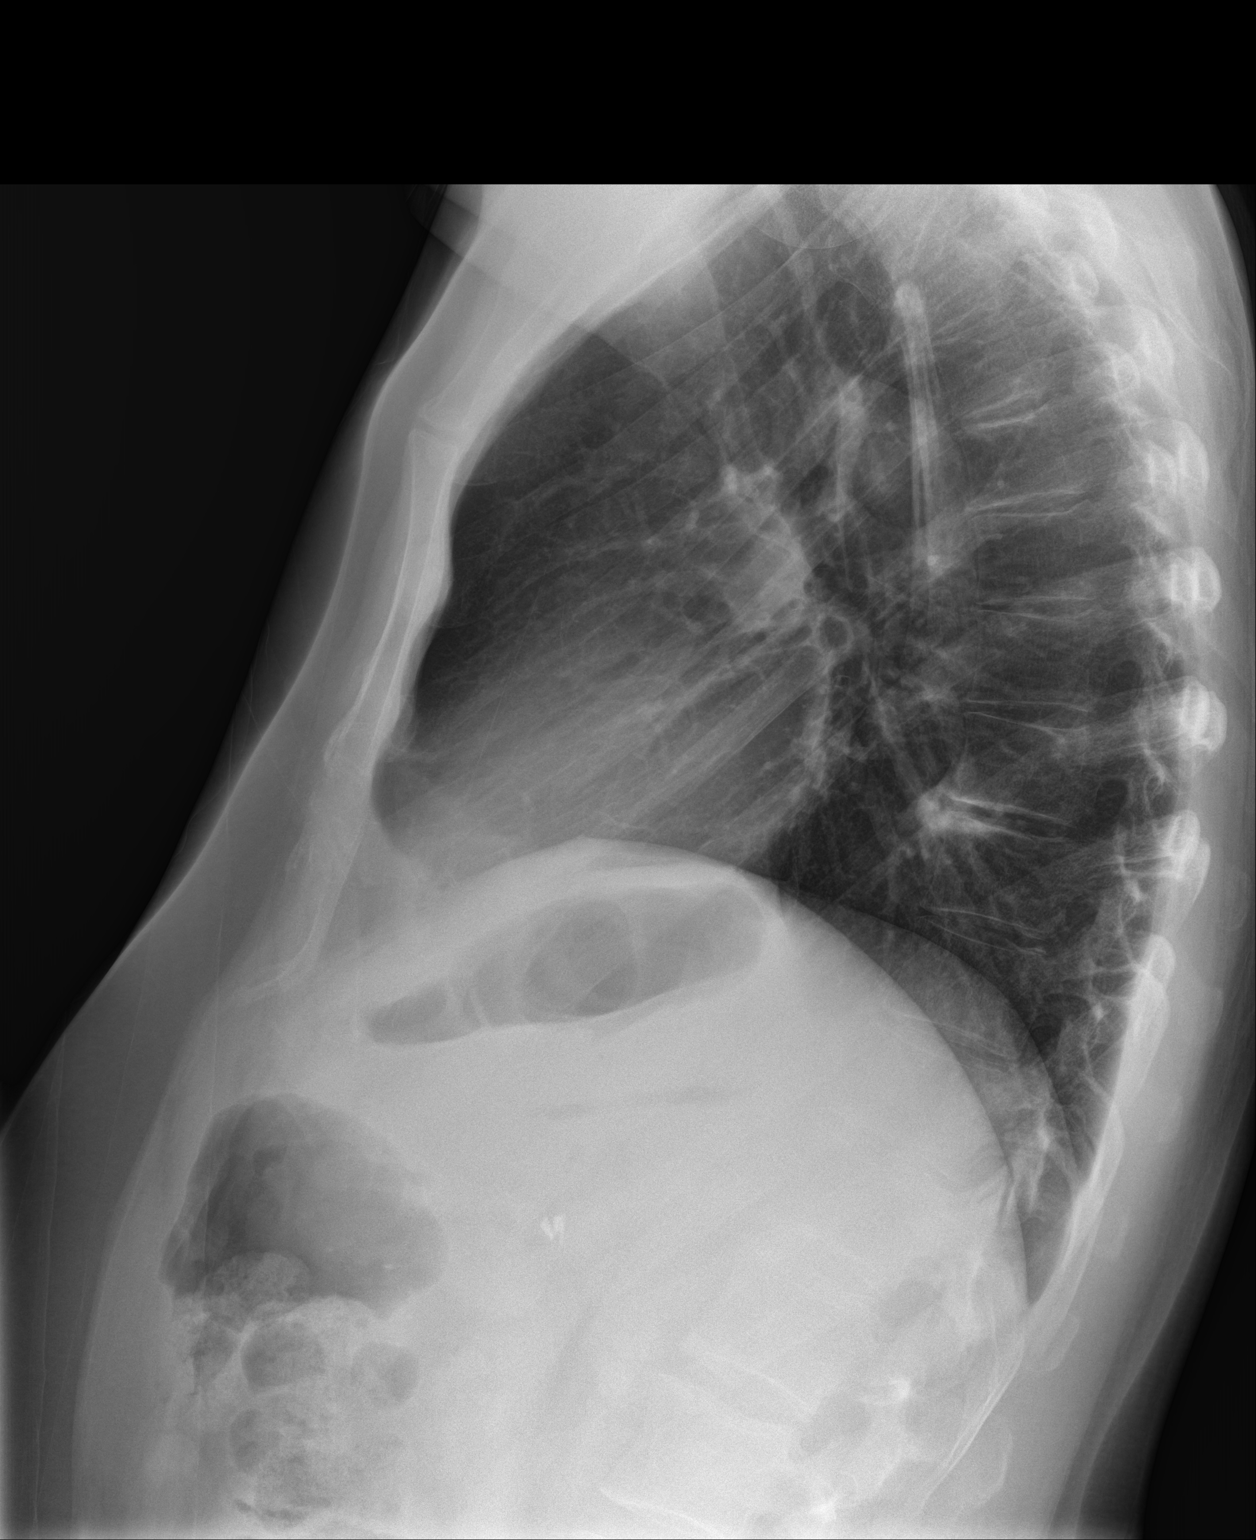

[2 of 2 positions shown; findings below may reference images not displayed]

FINDINGS: Stable calcified right upper lobe granuloma.  Lungs otherwise clear.

Heart size and mediastinal contours are within normal limits.

No effusion.

Visualized bones unremarkable.  Cholecystectomy clips.
IMPRESSION: No acute cardiopulmonary disease.

## 2023-11-12 ENCOUNTER — Other Ambulatory Visit: Payer: Self-pay | Admitting: Student

## 2023-11-12 DIAGNOSIS — G8929 Other chronic pain: Secondary | ICD-10-CM

## 2023-11-27 ENCOUNTER — Ambulatory Visit
Admission: RE | Admit: 2023-11-27 | Discharge: 2023-11-27 | Disposition: A | Discharge status: Home / Self Care | Payer: 59 | Source: Ambulatory Visit | Attending: Student | Admitting: Student

## 2023-11-27 DIAGNOSIS — G8929 Other chronic pain: Secondary | ICD-10-CM

## 2023-12-16 ENCOUNTER — Ambulatory Visit: Payer: 59 | Admitting: Physician Assistant

## 2023-12-29 ENCOUNTER — Ambulatory Visit
Admission: RE | Admit: 2023-12-29 | Discharge: 2023-12-29 | Disposition: A | Discharge status: Home / Self Care | Payer: 59 | Source: Ambulatory Visit | Attending: Emergency Medicine | Admitting: Emergency Medicine

## 2023-12-29 VITALS — BP 106/74 | HR 102 | Temp 98.0°F | Resp 18

## 2023-12-29 DIAGNOSIS — J45901 Unspecified asthma with (acute) exacerbation: Secondary | ICD-10-CM

## 2023-12-29 LAB — POC COVID19/FLU A&B COMBO
Covid Antigen, POC: NEGATIVE
Influenza A Antigen, POC: NEGATIVE
Influenza B Antigen, POC: NEGATIVE

## 2023-12-29 MED ORDER — PREDNISONE 10 MG PO TABS: 40.0000 mg | ORAL_TABLET | Freq: Every day | ORAL | 0 refills | Status: AC

## 2023-12-29 NOTE — ED Provider Notes (Signed)
Daniel [name]    CSN: [id number] Arrival date & time: 12/29/23  1239      History   Chief Complaint Chief Complaint  Patient presents with   Cough    Very congested been this way for 3 days - Entered by patient    HPI DONTAE [name] is a 53 y.o. male.  Patient presents with 3-day history of congestion and cough.  No fever or shortness of breath.  He has been treating his symptoms with albuterol and Pulmicort; last used on 12/26/2023.  His medical history includes asthma.  The history is provided by the patient and medical records.    Past Medical History:  Diagnosis Date   Asthma    Bipolar 1 disorder (HCC)    Chronic back pain    DDD (degenerative disc disease), cervical    Depression    Diabetes mellitus without complication (HCC)    ED (erectile dysfunction)    Elevated lipids    GERD (gastroesophageal reflux disease)    Headache    Insomnia    Migraine    1-2x/month since starting emgality   Seasonal allergies    Sleep apnea    Wears hearing aid in both ears    Wheezing     Patient Active Problem List   Diagnosis Date Noted   OSA (obstructive sleep apnea) 02/02/2023   Obesity (BMI 30-39.9) 02/02/2023   Cervical radiculopathy 11/19/2022   Bipolar II disorder, mild, hypomanic, with mixed features, in full remission (HCC) 09/30/2022   Photophobia 10/28/2021   S/P revision of total knee, 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   Elevated blood-pressure reading, without diagnosis of hypertension 01/30/2021   Left arm pain 11/27/2020   Nausea 11/27/2020   Headache disorder 03/30/2020   Status post total knee replacement using cement, 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   Lower urinary tract symptoms (LUTS) 05/13/2019   Recurrent bronchospasm 03/15/2019   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   Incomplete tear of right rotator cuff 10/15/2017   Tendinitis of upper biceps tendon of right shoulder 10/15/2017   Seborrhea 09/23/2017   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  09/09/2017   Rotator cuff tendinitis, right 09/09/2017   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   Displacement of lumbar intervertebral disc without myelopathy 04/04/2015   Facet arthritis of lumbar region 03/14/2015   DDD (degenerative disc disease), lumbar 01/02/2015   Lumbar radiculitis 01/02/2015   DM2 (diabetes mellitus, type 2) (HCC) 10/30/2013   Knee joint effusion 03/14/2013   Left knee pain 03/14/2013   Articular cartilage disorder, other specified site 11/09/2012   Chronic back pain 09/10/2012    Past Surgical History:  Procedure Laterality Date   CHOLECYSTECTOMY     COLONOSCOPY W/ BIOPSIES     ESOPHAGOGASTRODUODENOSCOPY (EGD) WITH PROPOFOL N/A 03/17/2023   Procedure: ESOPHAGOGASTRODUODENOSCOPY (EGD) WITH PROPOFOL;  Surgeon: [name], MD;  Location: [facility];  Service: Endoscopy;  Laterality: N/A;   FOOT ARTHRODESIS Left 05/13/2023   Procedure: ARTHRODESIS FOOT;  Surgeon: [name], DPM;  Location: [facility];  Service: Podiatry;  Laterality: Left;  Diabetic   FRACTURE SURGERY     tail bone   knee arthroscpy Right    POSTERIOR CERVICAL LAMINECTOMY Right 11/19/2022   Procedure: RIGHT C5-6 FORAMINOTOMY;  Surgeon: Venetia Night, MD;  Location: [facility];  Service: Neurosurgery;  Laterality: Right;   SHOULDER ARTHROSCOPY WITH DISTAL CLAVICLE RESECTION Right 10/13/2017   Procedure: SHOULDER ARTHROSCOPY WITH DISTAL CLAVICLE RESECTION;  Surgeon: Christena Flake, MD;  Location: [facility];  Service: Orthopedics;  Laterality: Right;   SHOULDER ARTHROSCOPY WITH OPEN ROTATOR CUFF REPAIR Right 10/13/2017   Procedure: SHOULDER ARTHROSCOPY WITH OPEN ROTATOR CUFF REPAIR/;  Surgeon: Christena Flake, MD;  Location: [facility];  Service: Orthopedics;  Laterality: Right;   TOTAL KNEE ARTHROPLASTY Right 12/06/2019   Procedure: TOTAL KNEE ARTHROPLASTY;  Surgeon: Kennedy Bucker, MD;  Location: [facility];  Service: Orthopedics;  Laterality: Right;   TOTAL KNEE  ARTHROPLASTY Right 05/14/2021   Procedure: REVISION TOTAL KNEE ARTHROPLASTY - [place] Neighbor to Assist;  Surgeon: Kennedy Bucker, MD;  Location: [facility];  Service: Orthopedics;  Laterality: Right;       Home Medications    Prior to Admission medications   Medication Sig Start Date End Date Taking? Authorizing Provider  predniSONE (DELTASONE) 10 MG tablet Take 4 tablets (40 mg total) by mouth daily for 5 days. 12/29/23 01/03/24 Yes Mickie Bail, NP  albuterol (PROVENTIL HFA;VENTOLIN HFA) 108 (90 Base) MCG/ACT inhaler Inhale 1 puff into the lungs every 4 (four) hours as needed for wheezing or shortness of breath.     [provider]  albuterol (PROVENTIL) (2.5 MG/3ML) 0.083% nebulizer solution Take 2.5 mg by nebulization every 4 (four) hours as needed for wheezing.     [provider]  atorvastatin (LIPITOR) 40 MG tablet Take 40 mg by mouth at bedtime. 05/06/22 05/06/23  [provider]  budesonide (PULMICORT) 1 MG/2ML nebulizer solution INHALE 1 VIAL VIA NEBULIZER ONCE DAILY 05/26/22   [provider]  busPIRone (BUSPAR) 5 MG tablet Take 1 tablet by mouth 2 (two) times daily. 06/08/23 06/07/24  [provider]  celecoxib (CELEBREX) 200 MG capsule Take 1 capsule by mouth 2 (two) times daily. 06/23/22   [provider]  Continuous Blood Gluc Sensor (FREESTYLE LIBRE 3 SENSOR) MISC As directedfor glucose monitoring 05/12/22   [provider]  divalproex (DEPAKOTE ER) 250 MG 24 hr tablet Take 250 mg by mouth in the morning and at bedtime.    [provider]  DULoxetine (CYMBALTA) 20 MG capsule Take 20 mg by mouth at bedtime.    [provider]  empagliflozin (JARDIANCE) 10 MG TABS tablet Take 10 mg by mouth daily.    [provider]  emtricitabine-tenofovir AF (DESCOVY) 200-25 MG tablet Take 1 tablet by mouth daily. 11/15/20   [provider]  FLUoxetine (PROZAC) 20 MG capsule Take 20 mg by mouth daily.    [provider]  gabapentin (NEURONTIN) 300 MG capsule Take 600 mg by mouth 2 (two) times daily.    [provider]  Galcanezumab-gnlm (EMGALITY) 120 MG/ML SOAJ Inject 120 mg into the skin every 30 (thirty) days.    [provider]  glipiZIDE (GLUCOTROL XL) 5 MG 24 hr tablet Take 5 mg by mouth daily. 05/14/23   [provider]  Lancets [facility] DELICA PLUS [place]) MISC See admin instructions. 01/15/23   [provider]  Melatonin 3 MG TABS Take 3 mg by mouth at bedtime.     [provider]  montelukast (SINGULAIR) 10 MG tablet Take 10 mg by mouth at bedtime.    [provider]  Multiple Vitamin (MULTIVITAMIN WITH MINERALS) TABS tablet Take 1 tablet by mouth daily.    [provider]  nortriptyline (PAMELOR) 50 MG capsule at bedtime. Take 40mg  nightly for one week, then increase to 50mg  nightly 06/26/22   [provider]  [facility] ULTRA test strip  03/21/23   [provider]  pantoprazole (PROTONIX)  40 MG tablet Take 40 mg by mouth 2 (two) times daily.    [provider]  propranolol (INDERAL) 10 MG tablet Take 10 mg by mouth 2 (two) times daily.    [provider]  pseudoephedrine-guaifenesin (MUCINEX D) 60-600 MG 12 hr tablet Take 1 tablet by mouth every 12 (twelve) hours as needed. 09/29/22   [provider]  rizatriptan (MAXALT) 10 MG tablet May take a second dose after 2 hours if needed. 06/26/22   [provider]  sitaGLIPtin (JANUVIA) 100 MG tablet Take 100 mg by mouth daily. 11/04/22 11/04/23  [provider]  tadalafil (CIALIS) 5 MG tablet Take 1 tablet (5 mg total) by mouth daily. 07/23/23   Sondra Come, MD  tiZANidine (ZANAFLEX) 4 MG tablet Take 1 tablet (4 mg total) by mouth 3 (three) times daily. 11/19/22   Susanne Borders, PA  traZODone (DESYREL) 150 MG tablet Take 150 mg by mouth at bedtime. 06/03/23   [provider]  triamcinolone cream (KENALOG)  0.1 % Apply 1 Application topically 2 (two) times daily. 02/16/23   [provider]  zonisamide (ZONEGRAN) 100 MG capsule Take 100 mg by mouth daily. 06/26/22   [provider]  famotidine (PEPCID) 20 MG tablet Take 1 tablet (20 mg total) by mouth 2 (two) times daily. 03/05/18 06/01/20  Merrily Brittle, MD    Family History Family History  Problem Relation Age of Onset   Diabetes Mother    Hypertension Mother    Stroke Father     Social History Social History   Tobacco Use   Smoking status: Former    Current packs/day: 0.00    Types: Cigarettes    Quit date: 11/28/2014    Years since quitting: 9.0   Smokeless tobacco: Never  Vaping Use   Vaping status: Never Used  Substance Use Topics   Alcohol use: No    Alcohol/week: 0.0 standard drinks of alcohol   Drug use: No     Allergies   Doxycycline, Ketorolac, Omega-3, Other, Parabens, Tramadol, and Penicillins   Review of Systems Review of Systems  Constitutional:  Negative for chills and fever.  HENT:  Negative for ear pain and sore throat.   Respiratory:  Positive for cough. Negative for shortness of breath and wheezing.   Cardiovascular:  Negative for chest pain and palpitations.     Physical Exam Triage Vital Signs ED Triage Vitals [12/29/23 1258]  Encounter Vitals Group     BP      Systolic BP Percentile      Diastolic BP Percentile      Pulse Rate (!) 102     Resp 18     Temp 98 F (36.7 C)     Temp src      SpO2 96 %     Weight      Height      Head Circumference      Peak Flow      Pain Score 5     Pain Loc      Pain Education      Exclude from Growth Chart    No data found.  Updated Vital Signs BP 106/74   Pulse (!) 102   Temp 98 F (36.7 C)   Resp 18   SpO2 96%   Visual Acuity Right Eye Distance:   Left Eye Distance:   Bilateral Distance:    Right Eye Near:   Left Eye Near:    Bilateral Near:  Physical Exam Constitutional:      General: He is not in acute  distress. HENT:     Right Ear: Tympanic membrane normal.     Left Ear: Tympanic membrane normal.     Nose: Nose normal.     Mouth/Throat:     Mouth: Mucous membranes are moist.     Pharynx: Oropharynx is clear.  Cardiovascular:     Rate and Rhythm: Normal rate and regular rhythm.     Heart sounds: Normal heart sounds.  Pulmonary:     Effort: Pulmonary effort is normal. No respiratory distress.     Breath sounds: Normal breath sounds.  Neurological:     Mental Status: He is alert.      UC Treatments / Results  Labs (all labs ordered are listed, but only abnormal results are displayed) Labs Reviewed  POC COVID19/FLU A&B COMBO    EKG   Radiology No results found.  Procedures Procedures (including critical care time)  Medications Ordered in UC Medications - No data to display  Initial Impression / Assessment and Plan / UC Course  I have reviewed the triage vital signs and the nursing notes.  Pertinent labs & imaging results that were available during my care of the patient were reviewed by me and considered in my medical decision making (see chart for details).    Asthma exacerbation.  No respiratory distress.  Lungs are clear and O2 sat is 96% on room air.  Rapid flu and COVID-negative.  Instructed patient to continue using his albuterol and Pulmicort as directed.  Treating today with prednisone x 5 days.  ED precautions given.  Instructed patient to follow-up with his PCP.  Education provided on adult asthma.  He agrees to plan of care.  Final Clinical Impressions(s) / UC Diagnoses   Final diagnoses:  Asthma with acute exacerbation, unspecified asthma severity, unspecified whether persistent     Discharge Instructions      Take the prednisone as directed.  Continue to use your albuterol and Pulmicort as directed.  Follow up with your primary care provider.  Go to the emergency department if you have worsening symptoms.    The flu and COVID tests are negative.           ED Prescriptions     Medication Sig Dispense Auth. Provider   predniSONE (DELTASONE) 10 MG tablet Take 4 tablets (40 mg total) by mouth daily for 5 days. 20 tablet Mickie Bail, NP      PDMP not reviewed this encounter.   Mickie Bail, NP 12/29/23 1315

## 2023-12-29 NOTE — Discharge Instructions (Addendum)
Take the prednisone as directed.  Continue to use your albuterol and Pulmicort as directed.  Follow up with your primary care provider.  Go to the emergency department if you have worsening symptoms.    The flu and COVID tests are negative.

## 2023-12-29 NOTE — ED Triage Notes (Signed)
Patient to Urgent Care with complaints of  dry cough/ nasal and chest congestion. Denies any known fevers. Hx of asthma  Reports symptoms started three days ago.  Using breathing treatments- budesonide/ albuterol.

## 2023-12-31 ENCOUNTER — Ambulatory Visit: Payer: 59 | Admitting: Physician Assistant

## 2023-12-31 ENCOUNTER — Inpatient Hospital Stay
Admission: RE | Admit: 2023-12-31 | Discharge: 2023-12-31 | Disposition: A | Discharge status: Home / Self Care | Payer: Self-pay | Source: Ambulatory Visit | Attending: Neurosurgery | Admitting: Neurosurgery

## 2023-12-31 ENCOUNTER — Other Ambulatory Visit: Payer: Self-pay | Admitting: Family Medicine

## 2023-12-31 DIAGNOSIS — Z049 Encounter for examination and observation for unspecified reason: Secondary | ICD-10-CM

## 2024-01-06 ENCOUNTER — Ambulatory Visit: Payer: 59 | Admitting: Orthopedic Surgery

## 2024-01-07 ENCOUNTER — Encounter: Payer: Self-pay | Admitting: Physician Assistant

## 2024-01-07 ENCOUNTER — Ambulatory Visit: Payer: 59 | Admitting: Physician Assistant

## 2024-01-07 VITALS — BP 105/71 | HR 124 | Ht 69.0 in | Wt 235.0 lb

## 2024-01-07 DIAGNOSIS — R339 Retention of urine, unspecified: Secondary | ICD-10-CM | POA: Diagnosis not present

## 2024-01-07 DIAGNOSIS — R399 Unspecified symptoms and signs involving the genitourinary system: Secondary | ICD-10-CM | POA: Diagnosis not present

## 2024-01-07 LAB — MICROSCOPIC EXAMINATION

## 2024-01-07 LAB — URINALYSIS, COMPLETE
Bilirubin, UA: NEGATIVE
Ketones, UA: NEGATIVE
Leukocytes,UA: NEGATIVE
Nitrite, UA: NEGATIVE
Protein,UA: NEGATIVE
RBC, UA: NEGATIVE
Specific Gravity, UA: 1.02 (ref 1.005–1.030)
Urobilinogen, Ur: 1 mg/dL (ref 0.2–1.0)
pH, UA: 7 (ref 5.0–7.5)

## 2024-01-07 LAB — BLADDER SCAN AMB NON-IMAGING: Scan Result: 66

## 2024-01-07 MED ORDER — TAMSULOSIN HCL 0.4 MG PO CAPS: 0.4000 mg | ORAL_CAPSULE | Freq: Every day | ORAL | 11 refills | Status: DC

## 2024-01-07 NOTE — Progress Notes (Addendum)
01/07/2024 9:42 AM   Daniel [name] 24-Jan-1971 [id number]  CC: Chief Complaint  Patient presents with   Urinary Incontinence   HPI: Daniel [name] is a 53 y.o. 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   Today he reports persistent urinary hesitancy, weak stream, and frequency despite tadalafil.  He does think that tadalafil is helping somewhat.  No dizziness at baseline.  In-office UA today positive for 3+ glucose; urine microscopy pan negative. PVR 66mL.  IPSS 12/terrible as below.   IPSS     Row Name 01/07/24 0900         International Prostate Symptom Score   How often have you had the sensation of not emptying your bladder? Less than half the time     How often have you had to urinate less than every two hours? Almost always     How often have you found you stopped and started again several times when you urinated? Not at All     How often have you found it difficult to postpone urination? Less than 1 in 5 times     How often have you had a weak urinary stream? More than half the time     How often have you had to strain to start urination? Not at All     How many times did you typically get up at night to urinate? None     Total IPSS Score 12               PMH: Past Medical History:  Diagnosis Date   Asthma    Bipolar 1 disorder (HCC)    Chronic back pain    DDD (degenerative disc disease), cervical    Depression    Diabetes mellitus without complication (HCC)    ED (erectile dysfunction)    Elevated lipids    GERD (gastroesophageal reflux disease)    Headache    Insomnia    Migraine    1-2x/month since starting emgality   Seasonal allergies    Sleep apnea    Wears hearing aid in both ears    Wheezing     Surgical History: Past Surgical History:  Procedure Laterality Date   CHOLECYSTECTOMY     COLONOSCOPY W/ BIOPSIES      ESOPHAGOGASTRODUODENOSCOPY (EGD) WITH PROPOFOL N/A 03/17/2023   Procedure: ESOPHAGOGASTRODUODENOSCOPY (EGD) WITH PROPOFOL;  Surgeon: [name], MD;  Location: [facility];  Service: Endoscopy;  Laterality: N/A;   FOOT ARTHRODESIS Left 05/13/2023   Procedure: ARTHRODESIS FOOT;  Surgeon: [name], DPM;  Location: [facility];  Service: Podiatry;  Laterality: Left;  Diabetic   FRACTURE SURGERY     tail bone   knee arthroscpy Right    POSTERIOR CERVICAL LAMINECTOMY Right 11/19/2022   Procedure: RIGHT C5-6 FORAMINOTOMY;  Surgeon: Venetia Night, MD;  Location: [facility];  Service: Neurosurgery;  Laterality: Right;   SHOULDER ARTHROSCOPY WITH DISTAL CLAVICLE RESECTION Right 10/13/2017   Procedure: SHOULDER ARTHROSCOPY WITH DISTAL CLAVICLE RESECTION;  Surgeon: Christena Flake, MD;  Location: [facility];  Service: Orthopedics;  Laterality: Right;   SHOULDER ARTHROSCOPY WITH OPEN ROTATOR CUFF REPAIR Right 10/13/2017   Procedure: SHOULDER ARTHROSCOPY WITH OPEN ROTATOR CUFF REPAIR/;  Surgeon: Christena Flake, MD;  Location: [facility];  Service: Orthopedics;  Laterality: Right;   TOTAL KNEE ARTHROPLASTY Right 12/06/2019   Procedure: TOTAL KNEE ARTHROPLASTY;  Surgeon: Rosita Kea,  Casimiro Needle, MD;  Location: [facility];  Service: Orthopedics;  Laterality: Right;   TOTAL KNEE ARTHROPLASTY Right 05/14/2021   Procedure: REVISION TOTAL KNEE ARTHROPLASTY - [place] Neighbor to Assist;  Surgeon: Kennedy Bucker, MD;  Location: [facility];  Service: Orthopedics;  Laterality: Right;    Home Medications:  Allergies as of 01/07/2024       Reactions   Doxycycline Nausea And Vomiting   Ketorolac Itching   Other reaction(s): Unknown   Omega 3-6-9 Other (See Comments)   By allergy testing   Omega-3    By allergy testing   Other Other (See Comments)   Confirmed by allergy testing Mercaptobenzothiazole Able to wear latex gloves   Parabens Other (See Comments)   Confirmed by allergy testing Able to wear latex gloves    Tramadol Itching, Other (See Comments)   Tolerates extended release Other reaction(s):    Penicillins Hives, Rash, Other (See Comments)   Has patient had a PCN reaction causing immediate rash, facial/tongue/throat swelling, SOB or lightheadedness with hypotension: Yes Has patient had a PCN reaction causing severe rash involving mucus membranes or skin necrosis: No Has patient had a PCN reaction that required hospitalization No Has patient had a PCN reaction occurring within the last 10 years: No If all of the above answers are "NO", then may proceed with Cephalosporin use. Other reaction(s):  Can take keflex        Medication List        Accurate as of January 07, 2024  9:42 AM. If you have any questions, ask your nurse or doctor.          STOP taking these medications    tiZANidine 4 MG tablet Commonly known as: ZANAFLEX Stopped by: Carman Ching       TAKE these medications    albuterol 108 (90 Base) MCG/ACT inhaler Commonly known as: VENTOLIN HFA Inhale 1 puff into the lungs every 4 (four) hours as needed for wheezing or shortness of breath.   albuterol (2.5 MG/3ML) 0.083% nebulizer solution Commonly known as: PROVENTIL Take 2.5 mg by nebulization every 4 (four) hours as needed for wheezing.   atorvastatin 40 MG tablet Commonly known as: LIPITOR Take 40 mg by mouth at bedtime.   budesonide 1 MG/2ML nebulizer solution Commonly known as: PULMICORT INHALE 1 VIAL VIA NEBULIZER ONCE DAILY   busPIRone 5 MG tablet Commonly known as: BUSPAR Take 1 tablet by mouth 2 (two) times daily.   celecoxib 200 MG capsule Commonly known as: CELEBREX Take 1 capsule by mouth 2 (two) times daily.   Descovy 200-25 MG tablet Generic drug: emtricitabine-tenofovir AF Take 1 tablet by mouth daily.   divalproex 250 MG 24 hr tablet Commonly known as: DEPAKOTE ER Take 250 mg by mouth in the morning and at bedtime.   DULoxetine 20 MG capsule Commonly known as:  CYMBALTA Take 20 mg by mouth at bedtime.   Emgality 120 MG/ML Soaj Generic drug: Galcanezumab-gnlm Inject 120 mg into the skin every 30 (thirty) days.   FLUoxetine 20 MG capsule Commonly known as: PROZAC Take 20 mg by mouth daily.   FreeStyle Libre 3 Sensor Misc As directedfor glucose monitoring   gabapentin 300 MG capsule Commonly known as: NEURONTIN Take 600 mg by mouth 2 (two) times daily.   glipiZIDE 5 MG 24 hr tablet Commonly known as: GLUCOTROL XL Take 5 mg by mouth daily.   Jardiance 10 MG Tabs tablet Generic drug: empagliflozin Take 10 mg by mouth daily.   melatonin 3  MG Tabs tablet Take 3 mg by mouth at bedtime.   montelukast 10 MG tablet Commonly known as: SINGULAIR Take 10 mg by mouth at bedtime.   multivitamin with minerals Tabs tablet Take 1 tablet by mouth daily.   nortriptyline 50 MG capsule Commonly known as: PAMELOR at bedtime. Take 40mg  nightly for one week, then increase to 50mg  nightly   OneTouch Delica Plus Lancet33G Misc See admin instructions.   OneTouch Ultra test strip Generic drug: glucose blood   pantoprazole 40 MG tablet Commonly known as: PROTONIX Take 40 mg by mouth 2 (two) times daily.   propranolol 10 MG tablet Commonly known as: INDERAL Take 10 mg by mouth 2 (two) times daily.   pseudoephedrine-guaifenesin 60-600 MG 12 hr tablet Commonly known as: MUCINEX D Take 1 tablet by mouth every 12 (twelve) hours as needed.   rizatriptan 10 MG tablet Commonly known as: MAXALT May take a second dose after 2 hours if needed.   sitaGLIPtin 100 MG tablet Commonly known as: JANUVIA Take 100 mg by mouth daily.   tadalafil 5 MG tablet Commonly known as: CIALIS Take 1 tablet (5 mg total) by mouth daily.   tamsulosin 0.4 MG Caps capsule Commonly known as: FLOMAX Take 1 capsule (0.4 mg total) by mouth daily. Started by: Carman Ching   traZODone 150 MG tablet Commonly known as: DESYREL Take 150 mg by mouth at bedtime.    triamcinolone cream 0.1 % Commonly known as: KENALOG Apply 1 Application topically 2 (two) times daily.   zonisamide 100 MG capsule Commonly known as: ZONEGRAN Take 100 mg by mouth daily.        Allergies:  Allergies  Allergen Reactions   Doxycycline Nausea And Vomiting   Ketorolac Itching    Other reaction(s): Unknown   Omega 3-6-9 Other (See Comments)    By allergy testing   Omega-3     By allergy testing    Other Other (See Comments)    Confirmed by allergy testing Mercaptobenzothiazole Able to wear latex gloves   Parabens Other (See Comments)    Confirmed by allergy testing Able to wear latex gloves    Tramadol Itching and Other (See Comments)    Tolerates extended release Other reaction(s):    Penicillins Hives, Rash and Other (See Comments)    Has patient had a PCN reaction causing immediate rash, facial/tongue/throat swelling, SOB or lightheadedness with hypotension: Yes Has patient had a PCN reaction causing severe rash involving mucus membranes or skin necrosis: No Has patient had a PCN reaction that required hospitalization No Has patient had a PCN reaction occurring within the last 10 years: No If all of the above answers are "NO", then may proceed with Cephalosporin use. Other reaction(s):  Can take keflex    Family History: Family History  Problem Relation Age of Onset   Diabetes Mother    Hypertension Mother    Stroke Father     Social History:   reports that he quit smoking about 9 years ago. His smoking use included cigarettes. He has never used smokeless tobacco. He reports that he does not drink alcohol and does not use drugs.  Physical Exam: BP 105/71   Pulse (!) 124   Ht 5\' 9"  (1.753 m)   Wt 235 lb (106.6 kg)   BMI 34.70 kg/m   Constitutional:  Alert and oriented, no acute distress, nontoxic appearing HEENT: [place], AT Cardiovascular: No clubbing, cyanosis, or edema Respiratory: Normal respiratory effort, no increased work of  breathing  Skin: No rashes, bruises or suspicious lesions Neurologic: Grossly intact, no focal deficits, moving all 4 extremities Psychiatric: Normal mood and affect  Laboratory Data: Results for orders placed or performed in visit on 01/07/24  Microscopic Examination   Collection Time: 01/07/24  8:49 AM   Urine  Result Value Ref Range   WBC, UA 0-5 0 - 5 /hpf   RBC, Urine 0-2 0 - 2 /hpf   Epithelial Cells (non renal) 0-10 0 - 10 /hpf   Crystals Present (A) N/A   Crystal Type Amorphous Sediment N/A   Mucus, UA Present (A) Not Estab.   Bacteria, UA Few None seen/Few  Urinalysis, Complete   Collection Time: 01/07/24  8:49 AM  Result Value Ref Range   Specific Gravity, UA 1.020 1.005 - 1.030   pH, UA 7.0 5.0 - 7.5   Color, UA Yellow Yellow   Appearance Ur Clear Clear   Leukocytes,UA Negative Negative   Protein,UA Negative Negative/Trace   Glucose, UA 3+ (A) Negative   Ketones, UA Negative Negative   RBC, UA Negative Negative   Bilirubin, UA Negative Negative   Urobilinogen, Ur 1.0 0.2 - 1.0 mg/dL   Nitrite, UA Negative Negative   Microscopic Examination See below:   Bladder Scan (Post Void Residual) in office   Collection Time: 01/07/24  9:02 AM  Result Value Ref Range   Scan Result 66    Assessment & Plan:   1. Lower urinary tract symptoms (LUTS) (Primary) Persistent frequency, hesitancy, and weak stream despite low-dose daily tadalafil.  He is emptying appropriately.  Will have him continue this and augment with Flomax.  We discussed common side effects including orthostasis and retrograde ejaculation.  Will see him back in 6 weeks for symptom recheck with IPSS and PVR.  We discussed that if he is no better, we will pursue cystoscopy. - Urinalysis, Complete - Bladder Scan (Post Void Residual) in office - tamsulosin (FLOMAX) 0.4 MG CAPS capsule; Take 1 capsule (0.4 mg total) by mouth daily.  Dispense: 30 capsule; Refill: 11   Return in about 6 weeks (around 02/18/2024) for  Symptom recheck with IPSS, PVR.  Carman Ching, PA-C  [facility] [place] [address], [address] [place], [place] [address] [phone]

## 2024-01-15 ENCOUNTER — Encounter: Payer: Self-pay | Admitting: Orthopedic Surgery

## 2024-01-15 NOTE — Progress Notes (Signed)
Referring Physician:  Anson Oregon, PA-C [address] [place],  [place] [address]  Primary Physician:  [facility]  History of Present Illness: 01/19/2024 Daniel [name] has a history of OSA, DM, bipolar disorder, asthma, and obesity.   He has history of right C5-C6 foraminotomy by Dr. Myer Haff on 11/19/22.   He has constant LBP that started in 2005 after he pulled a patient up in bed. Pain has been worse in last week or so. No leg pain, but he has numbness and tingling. He notes right leg weakness as well. Pain is worse with standing and lifting. No alleviating factors.   He is on neurontin, cymbalta, and celebrex with minimal relief.   His last HgbA1c was 6.3 on 12/07/23.   He does not smoke.   Bowel/Bladder Dysfunction: none. He's had two episodes of bowl urgency in last week- had urge to go and was going before he could get to bathroom. No perineal numbness.   Conservative measures:  Physical therapy:  has not participated in PT Multimodal medical therapy including regular antiinflammatories:  Methocarbamol, Gabapentin, celebrex, cymbalta Injections: no epidural steroid injections  Past Surgery:  11/19/2022 RIGHT C5-6 FORAMINOTOMY by Dr. Myer Haff Coccygectomy years ago  Daniel [name] has no symptoms of cervical myelopathy.  The symptoms are causing a significant impact on the patient's life.   Review of Systems:  A 10 point review of systems is negative, except for the pertinent positives and negatives detailed in the HPI.  Past Medical History: Past Medical History:  Diagnosis Date   Asthma    Bipolar 1 disorder (HCC)    Chronic back pain    DDD (degenerative disc disease), cervical    Depression    Diabetes mellitus without complication (HCC)    ED (erectile dysfunction)    Elevated lipids    GERD (gastroesophageal reflux disease)    Headache    Insomnia    Migraine    1-2x/month since starting emgality   Seasonal allergies     Sleep apnea    Wears hearing aid in both ears    Wheezing     Past Surgical History: Past Surgical History:  Procedure Laterality Date   CHOLECYSTECTOMY     COLONOSCOPY W/ BIOPSIES     ESOPHAGOGASTRODUODENOSCOPY (EGD) WITH PROPOFOL N/A 03/17/2023   Procedure: ESOPHAGOGASTRODUODENOSCOPY (EGD) WITH PROPOFOL;  Surgeon: [name], MD;  Location: [facility];  Service: Endoscopy;  Laterality: N/A;   FOOT ARTHRODESIS Left 05/13/2023   Procedure: ARTHRODESIS FOOT;  Surgeon: [name], DPM;  Location: [facility];  Service: Podiatry;  Laterality: Left;  Diabetic   FRACTURE SURGERY     tail bone   knee arthroscpy Right    POSTERIOR CERVICAL LAMINECTOMY Right 11/19/2022   Procedure: RIGHT C5-6 FORAMINOTOMY;  Surgeon: Venetia Night, MD;  Location: [facility];  Service: Neurosurgery;  Laterality: Right;   SHOULDER ARTHROSCOPY WITH DISTAL CLAVICLE RESECTION Right 10/13/2017   Procedure: SHOULDER ARTHROSCOPY WITH DISTAL CLAVICLE RESECTION;  Surgeon: Christena Flake, MD;  Location: [facility];  Service: Orthopedics;  Laterality: Right;   SHOULDER ARTHROSCOPY WITH OPEN ROTATOR CUFF REPAIR Right 10/13/2017   Procedure: SHOULDER ARTHROSCOPY WITH OPEN ROTATOR CUFF REPAIR/;  Surgeon: Christena Flake, MD;  Location: [facility];  Service: Orthopedics;  Laterality: Right;   TOTAL KNEE ARTHROPLASTY Right 12/06/2019   Procedure: TOTAL KNEE ARTHROPLASTY;  Surgeon: Kennedy Bucker, MD;  Location: [facility];  Service: Orthopedics;  Laterality: Right;   TOTAL KNEE ARTHROPLASTY  Right 05/14/2021   Procedure: REVISION TOTAL KNEE ARTHROPLASTY - [place] Neighbor to Assist;  Surgeon: Kennedy Bucker, MD;  Location: [facility];  Service: Orthopedics;  Laterality: Right;    Allergies: Allergies as of 01/19/2024 - Review Complete 01/19/2024  Allergen Reaction Noted   Doxycycline Nausea And Vomiting 03/03/2016   Ketorolac Itching 08/22/2018   Omega 3-6-9 Other (See Comments) 05/23/2014   Omega-3  05/23/2014    Other Other (See Comments) 05/23/2014   Parabens Other (See Comments) 05/23/2014   Tramadol Itching and Other (See Comments) 03/14/2015   Penicillins Hives, Rash, and Other (See Comments) [birth year]-1971    Medications: Outpatient Encounter Medications as of 01/19/2024  Medication Sig   albuterol (PROVENTIL HFA;VENTOLIN HFA) 108 (90 Base) MCG/ACT inhaler Inhale 1 puff into the lungs every 4 (four) hours as needed for wheezing or shortness of breath.    albuterol (PROVENTIL) (2.5 MG/3ML) 0.083% nebulizer solution Take 2.5 mg by nebulization every 4 (four) hours as needed for wheezing.    atorvastatin (LIPITOR) 40 MG tablet Take 40 mg by mouth at bedtime.   budesonide (PULMICORT) 1 MG/2ML nebulizer solution INHALE 1 VIAL VIA NEBULIZER ONCE DAILY   busPIRone (BUSPAR) 5 MG tablet Take 1 tablet by mouth 2 (two) times daily.   celecoxib (CELEBREX) 200 MG capsule Take 1 capsule by mouth 2 (two) times daily.   Continuous Blood Gluc Sensor (FREESTYLE LIBRE 3 SENSOR) MISC As directedfor glucose monitoring   divalproex (DEPAKOTE ER) 250 MG 24 hr tablet Take 250 mg by mouth in the morning and at bedtime.   DULoxetine (CYMBALTA) 20 MG capsule Take 20 mg by mouth at bedtime.   empagliflozin (JARDIANCE) 10 MG TABS tablet Take 10 mg by mouth daily.   emtricitabine-tenofovir AF (DESCOVY) 200-25 MG tablet Take 1 tablet by mouth daily.   famotidine (PEPCID) 20 MG tablet Take 20 mg by mouth 2 (two) times daily.   FLUoxetine (PROZAC) 20 MG capsule Take 20 mg by mouth daily.   gabapentin (NEURONTIN) 300 MG capsule Take 600 mg by mouth 2 (two) times daily.   Galcanezumab-gnlm (EMGALITY) 120 MG/ML SOAJ Inject 120 mg into the skin every 30 (thirty) days.   glipiZIDE (GLUCOTROL XL) 5 MG 24 hr tablet Take 5 mg by mouth daily.   Lancets (ONETOUCH DELICA PLUS LANCET33G) MISC See admin instructions.   Melatonin 3 MG TABS Take 3 mg by mouth at bedtime.    montelukast (SINGULAIR) 10 MG tablet Take 10 mg by mouth at bedtime.    Multiple Vitamin (MULTIVITAMIN WITH MINERALS) TABS tablet Take 1 tablet by mouth daily.   nortriptyline (PAMELOR) 50 MG capsule at bedtime. Take 40mg  nightly for one week, then increase to 50mg  nightly   ONETOUCH ULTRA test strip    pantoprazole (PROTONIX) 40 MG tablet Take 40 mg by mouth 2 (two) times daily.   propranolol (INDERAL) 10 MG tablet Take 10 mg by mouth 2 (two) times daily.   pseudoephedrine-guaifenesin (MUCINEX D) 60-600 MG 12 hr tablet Take 1 tablet by mouth every 12 (twelve) hours as needed.   rizatriptan (MAXALT) 10 MG tablet May take a second dose after 2 hours if needed.   sitaGLIPtin (JANUVIA) 100 MG tablet Take 100 mg by mouth daily.   tadalafil (CIALIS) 5 MG tablet Take 1 tablet (5 mg total) by mouth daily.   tamsulosin (FLOMAX) 0.4 MG CAPS capsule Take 1 capsule (0.4 mg total) by mouth daily.   traZODone (DESYREL) 150 MG tablet Take 150 mg by mouth at bedtime.  triamcinolone cream (KENALOG) 0.1 % Apply 1 Application topically 2 (two) times daily.   zonisamide (ZONEGRAN) 100 MG capsule Take 100 mg by mouth daily.   [DISCONTINUED] famotidine (PEPCID) 20 MG tablet Take 1 tablet (20 mg total) by mouth 2 (two) times daily.   No facility-administered encounter medications on file as of 01/19/2024.    Social History: Social History   Tobacco Use   Smoking status: Former    Current packs/day: 0.00    Types: Cigarettes    Quit date: 11/28/2014    Years since quitting: 9.1   Smokeless tobacco: Never  Vaping Use   Vaping status: Never Used  Substance Use Topics   Alcohol use: No    Alcohol/week: 0.0 standard drinks of alcohol   Drug use: No    Family Medical History: Family History  Problem Relation Age of Onset   Diabetes Mother    Hypertension Mother    Stroke Father     Physical Examination: Vitals:   01/19/24 1310  BP: 122/74    General: Patient is well developed, well nourished, calm, collected, and in no apparent distress. Attention to examination  is appropriate.  Respiratory: Patient is breathing without any difficulty.   NEUROLOGICAL:     Awake, alert, oriented to person, place, and time.  Speech is clear and fluent. Fund of knowledge is appropriate.   Cranial Nerves: Pupils equal round and reactive to light.  Facial tone is symmetric.    Well healed posterior lumbar incision. Mild diffuse lower lumbar tenderness.   No abnormal lesions on exposed skin.   Strength: Side Biceps Triceps Deltoid Interossei Grip Wrist Ext. Wrist Flex.  R 5 5 5 5 5 5 5   L 5 5 5 5 5 5 5    Side Iliopsoas Quads Hamstring PF DF EHL  R 5 5 5 5 5 5   L 5 5 5 5 5 5    Reflexes are 2+ and symmetric at the biceps, brachioradialis, patella and achilles.   Hoffman's is absent.  Clonus is not present.   Bilateral upper and lower extremity sensation is intact to light touch.     Gait is normal.    Medical Decision Making  Imaging: MRI lumbar spine dated 11/27/23:  FINDINGS: Segmentation:  Standard.   Alignment:  No substantial sagittal subluxation.   Vertebrae:  No fracture, evidence of discitis, or bone lesion.   Conus medullaris and cauda equina: Conus extends to the L2 level. Conus and cauda equina appear normal.   Paraspinal and other soft tissues: Unremarkable.   Disc levels:   T12-L1: No significant disc protrusion, foraminal stenosis, or canal stenosis.   L1-L2: No significant disc protrusion, foraminal stenosis, or canal stenosis.   L2-L3: No significant disc protrusion, foraminal stenosis, or canal stenosis.   L3-L4: Mild disc bulge. Bilateral facet arthropathy. No significant stenosis.   L4-L5: Significant right greater than left facet arthropathy. Mild disc bulging. Mild right greater than left foraminal stenosis. Patent canal.   L5-S1: Small central disc protrusion with annular fissure. Bilateral facet arthropathy. Mild bilateral foraminal stenosis. Patent canal.   IMPRESSION: 1. At L4-L5, significant right greater  than left facet arthropathy which has progressed and could cause back pain. Mild right greater than left foraminal stenosis at this level. 2. At L5-S1, facet arthropathy mild bilateral foraminal stenosis.     Electronically Signed   By: Feliberto Harts M.D.   On: 12/08/2023 16:34  I have personally reviewed the images and agree with the above interpretation.  Lumbar xrays dated 02/20/23:  Mild lumbar spondylosis.   Report not available for above xrays.   Assessment and Plan: Daniel [name] has constant LBP that started in 2005 after he pulled a patient up in bed. Pain has been worse in last week or so. No leg pain, but he has numbness and tingling. He notes right leg weakness as well.   He has history of coccygectomy years ago. He has known lumbar spondylosis with mild right > left foraminal stenosis L4-L5 and mild bilateral foraminal stenosis L5-S1.   LBP likely due to facets at L4-S1. Leg numbness/tingling may be due to foraminal stenosis.   Treatment options discussed with patient and following plan made:   - Order for physical therapy for lumbar spine to [facility]. He uses bus for transportation, but says he can get there.  - Discussed lumbar injections. He declines. Ones years ago made him worse.  - Lumbar xrays with flexion/extension ordered. Can get at [facility] when he goes for PT. Will put results on MyChart.  - Had 2 episodes of bowel urgency in last week. This does not appear lumbar mediated. He will follow up with PCP.  - Follow up with  me in 6-8 weeks and prn.   I spent a total of 30 minutes in face-to-face and non-face-to-face activities related to this patient's care today including review of outside records, review of imaging, review of symptoms, physical exam, discussion of differential diagnosis, discussion of treatment options, and documentation.   Thank you for involving me in the care of this patient.   Drake Leach PA-C Dept. of Neurosurgery

## 2024-01-19 ENCOUNTER — Encounter: Payer: Self-pay | Admitting: Orthopedic Surgery

## 2024-01-19 ENCOUNTER — Ambulatory Visit (INDEPENDENT_AMBULATORY_CARE_PROVIDER_SITE_OTHER): Payer: 59 | Admitting: Orthopedic Surgery

## 2024-01-19 VITALS — BP 122/74 | Ht 69.0 in | Wt 223.4 lb

## 2024-01-19 DIAGNOSIS — M47816 Spondylosis without myelopathy or radiculopathy, lumbar region: Secondary | ICD-10-CM

## 2024-01-19 DIAGNOSIS — M4726 Other spondylosis with radiculopathy, lumbar region: Secondary | ICD-10-CM

## 2024-01-19 DIAGNOSIS — M48061 Spinal stenosis, lumbar region without neurogenic claudication: Secondary | ICD-10-CM | POA: Diagnosis not present

## 2024-01-19 DIAGNOSIS — M5416 Radiculopathy, lumbar region: Secondary | ICD-10-CM

## 2024-01-19 NOTE — Patient Instructions (Signed)
It was so nice to see you today. Thank you so much for coming in.    You have some wear and tear in your back (arthritis) and this is likely what is causing your pain.   I ordered xrays of your lower back. You can get these done at [facility] when you go for PT. Check in at registration (next to coffee shop on first floor) and they will get you to radiology. You do not need any appointment. I will message you with results.   I sent physical therapy orders to [facility]. You can call them at [phone] if you don't hear from them to schedule your visit.   I will see you back in 6-8 weeks. Please do not hesitate to call if you have any questions or concerns. You can also message me in MyChart.   Drake Leach PA-C [phone]     The physicians and staff at [facility] at [facility] are committed to providing excellent care. You may receive a survey asking for feedback about your experience at our office. We value you your feedback and appreciate you taking the time to to fill it out. The [facility] leadership team is also available to discuss your experience in person, feel free to contact us [phone].

## 2024-01-25 ENCOUNTER — Ambulatory Visit
Admission: RE | Admit: 2024-01-25 | Discharge: 2024-01-25 | Disposition: A | Discharge status: Home / Self Care | Payer: 59 | Source: Ambulatory Visit | Attending: Orthopedic Surgery | Admitting: Orthopedic Surgery

## 2024-01-25 ENCOUNTER — Ambulatory Visit
Admission: RE | Admit: 2024-01-25 | Discharge: 2024-01-25 | Disposition: A | Discharge status: Home / Self Care | Payer: 59 | Attending: Orthopedic Surgery | Admitting: Orthopedic Surgery

## 2024-01-25 DIAGNOSIS — M47816 Spondylosis without myelopathy or radiculopathy, lumbar region: Secondary | ICD-10-CM

## 2024-01-25 DIAGNOSIS — M5416 Radiculopathy, lumbar region: Secondary | ICD-10-CM | POA: Insufficient documentation

## 2024-01-27 ENCOUNTER — Ambulatory Visit: Payer: 59

## 2024-02-02 ENCOUNTER — Ambulatory Visit: Payer: 59 | Attending: Orthopedic Surgery

## 2024-02-02 DIAGNOSIS — M5416 Radiculopathy, lumbar region: Secondary | ICD-10-CM | POA: Insufficient documentation

## 2024-02-02 DIAGNOSIS — R262 Difficulty in walking, not elsewhere classified: Secondary | ICD-10-CM | POA: Insufficient documentation

## 2024-02-02 DIAGNOSIS — M5459 Other low back pain: Secondary | ICD-10-CM | POA: Diagnosis present

## 2024-02-02 DIAGNOSIS — M47816 Spondylosis without myelopathy or radiculopathy, lumbar region: Secondary | ICD-10-CM | POA: Diagnosis not present

## 2024-02-02 NOTE — Therapy (Signed)
 OUTPATIENT PHYSICAL THERAPY EVALUATION   Patient Name: Daniel [name] MRN: [id number] DOB:[birth year], 53 y.o., male Today's Date: 02/02/2024  END OF SESSION:  PT End of Session - 02/02/24 1030     Visit Number 1    Number of Visits 24    Date for PT Re-Evaluation 05/01/24    Authorization Type UHC primary; Medicaid secondary    Authorization Time Period 02/02/24-05/01/24    Progress Note Due on Visit 10    PT Start Time 1025    PT Stop Time 1110    PT Time Calculation (min) 45 min    Activity Tolerance Patient tolerated treatment well;No increased pain;Patient limited by pain    Behavior During Therapy [facility] for tasks assessed/performed             Past Medical History:  Diagnosis Date   Asthma    Bipolar 1 disorder (HCC)    Chronic back pain    DDD (degenerative disc disease), cervical    Depression    Diabetes mellitus without complication (HCC)    ED (erectile dysfunction)    Elevated lipids    GERD (gastroesophageal reflux disease)    Headache    Insomnia    Migraine    1-2x/month since starting emgality   Seasonal allergies    Sleep apnea    Wears hearing aid in both ears    Wheezing    Past Surgical History:  Procedure Laterality Date   CHOLECYSTECTOMY     COLONOSCOPY W/ BIOPSIES     ESOPHAGOGASTRODUODENOSCOPY (EGD) WITH PROPOFOL N/A 03/17/2023   Procedure: ESOPHAGOGASTRODUODENOSCOPY (EGD) WITH PROPOFOL;  Surgeon: [name], MD;  Location: [facility];  Service: Endoscopy;  Laterality: N/A;   FOOT ARTHRODESIS Left 05/13/2023   Procedure: ARTHRODESIS FOOT;  Surgeon: [name], DPM;  Location: [facility];  Service: Podiatry;  Laterality: Left;  Diabetic   FRACTURE SURGERY     tail bone   knee arthroscpy Right    POSTERIOR CERVICAL LAMINECTOMY Right 11/19/2022   Procedure: RIGHT C5-6 FORAMINOTOMY;  Surgeon: Venetia Night, MD;  Location: [facility];  Service: Neurosurgery;  Laterality: Right;   SHOULDER ARTHROSCOPY WITH DISTAL  CLAVICLE RESECTION Right 10/13/2017   Procedure: SHOULDER ARTHROSCOPY WITH DISTAL CLAVICLE RESECTION;  Surgeon: Christena Flake, MD;  Location: [facility];  Service: Orthopedics;  Laterality: Right;   SHOULDER ARTHROSCOPY WITH OPEN ROTATOR CUFF REPAIR Right 10/13/2017   Procedure: SHOULDER ARTHROSCOPY WITH OPEN ROTATOR CUFF REPAIR/;  Surgeon: Christena Flake, MD;  Location: [facility];  Service: Orthopedics;  Laterality: Right;   TOTAL KNEE ARTHROPLASTY Right 12/06/2019   Procedure: TOTAL KNEE ARTHROPLASTY;  Surgeon: Kennedy Bucker, MD;  Location: [facility];  Service: Orthopedics;  Laterality: Right;   TOTAL KNEE ARTHROPLASTY Right 05/14/2021   Procedure: REVISION TOTAL KNEE ARTHROPLASTY - [place] Neighbor to Assist;  Surgeon: Kennedy Bucker, MD;  Location: [facility];  Service: Orthopedics;  Laterality: Right;   Patient Active Problem List   Diagnosis Date Noted   OSA (obstructive sleep apnea) 02/02/2023   Obesity (BMI 30-39.9) 02/02/2023   Cervical radiculopathy 11/19/2022   Bipolar II disorder, mild, hypomanic, with mixed features, in full remission (HCC) 09/30/2022   Photophobia 10/28/2021   S/P revision of total knee, 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   Elevated blood-pressure reading, without diagnosis of hypertension 01/30/2021   Left arm pain 11/27/2020   Nausea 11/27/2020   Headache disorder 03/30/2020   Status post total knee replacement using cement, 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   Lower urinary tract symptoms (LUTS)  05/13/2019   Recurrent bronchospasm 03/15/2019   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   Incomplete tear of right rotator cuff 10/15/2017   Tendinitis of upper biceps tendon of right shoulder 10/15/2017   Seborrhea 09/23/2017   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 09/09/2017   Rotator cuff tendinitis, right 09/09/2017   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   Displacement of lumbar intervertebral disc without myelopathy 04/04/2015   Facet arthritis of lumbar region  03/14/2015   DDD (degenerative disc disease), lumbar 01/02/2015   Lumbar radiculitis 01/02/2015   DM2 (diabetes mellitus, type 2) (HCC) 10/30/2013   Knee joint effusion 03/14/2013   Left knee pain 03/14/2013   Articular cartilage disorder, other specified site 11/09/2012   Chronic back pain 09/10/2012    PCP: [facility]  REFERRING PROVIDER: Drake Leach, PA [facility]) REFERRING DIAG: Lumbar spondylosis, radiculopathy  Rationale for Evaluation and Treatment: Rehabilitation  THERAPY DIAG:  Other low back pain  Difficulty in walking, not elsewhere classified ONSET DATE: 2005  SUBJECTIVE:                                                                                                                                                                                           SUBJECTIVE STATEMENT: Pt hoping to get some help with his progressive chronic low back pain.   PERTINENT HISTORY:  Daniel [name] is a 52yoM referred to OPPT by neurosurgery for lumbar spondylosis and radiculopathy. PMH: OSA, DM, BPD, asthma, obesity; PSH: Rt TKA revision 2022, Rt C5-6 foraminotomy 2023. Pt reports low back pain since 2005 after pulling a pt up in bed. (+) N/T and weakness, (-) leg pain. Symptoms exacerbated by standing and lifting. Pt has been taking Neurontin, Cymbalta, Celebrex without much relief.  Neurosurgical visit on 2/11 shows 5/5 BUE, BLE and symmetrical 2+ reflexes, intact LT sensation. Pt had lumbar MRI 11/27/23: showing mild/moderate DJD L5/S1, significant Rt facet arthropathy L4/S1.(Full report below). Pt reports persistent pain since 2005, but reports secondary point of exacerbation after a fall at work at [employer] about 3 years ago- at that time Dr. Dutch Quint in [facility] offering a lumbar fusion at that time but ultimately did not take place.   PAIN:  Are you having pain? Yes   -current: 8-9/10 central low back;   -6/10 best pain  -worst pain 10/10; when much worse can have  tingling in bilat legs buttocks to lateral ankles    PRECAUTIONS: None  WEIGHT BEARING RESTRICTIONS: No  FALLS:  Has patient fallen in last 6 months? No falls history or reported balance problems.   LIVING ENVIRONMENT: Lives with: alone  Lives  in: boarding house  Stairs: 2 railings  Has following equipment at home: none;   OCCUPATION: on disability   PLOF: On disability; walks for transportation to get to bus, occasionally limited by asthma attack   PATIENT GOALS: improve pain in his back to return to work   OBJECTIVE:  Note: Objective measures were completed at Evaluation unless otherwise noted.  DIAGNOSTIC FINDINGS:  MRI lumbar spine dated 11/27/23:  FINDINGS: Segmentation:  Standard.   Alignment:  No substantial sagittal subluxation. Vertebrae:  No fracture, evidence of discitis, or bone lesion. Conus medullaris and cauda equina: Conus extends to the L2 level. Conus and cauda equina appear normal. Paraspinal and other soft tissues: Unremarkable. Disc levels: T12-L1: No significant disc protrusion, foraminal stenosis, or canal stenosis.  L1-L2: No significant disc protrusion, foraminal stenosis, or canal stenosis.  L2-L3: No significant disc protrusion, foraminal stenosis, or canal stenosis.  L3-L4: Mild disc bulge. Bilateral facet arthropathy. No significant stenosis.  L4-L5: Significant right greater than left facet arthropathy. Mild disc bulging. Mild right greater than left foraminal stenosis. Patent canal.  L5-S1: Small central disc protrusion with annular fissure. Bilateral facet arthropathy. Mild bilateral foraminal stenosis. Patent canal.  IMPRESSION: 1. At L4-L5, significant right greater than left facet arthropathy which has progressed and could cause back pain. Mild right greater than left foraminal stenosis at this level. 2. At L5-S1, facet arthropathy mild bilateral foraminal stenosis. Electronically Signed   By: Feliberto Harts M.D.   On:  12/08/2023 16:34   PATIENT SURVEYS:  Roland Morris Questionnaire:7/24 FABQ: total 75; PA: 21; W: 33  SENSATION: -deferred (already examined by neurosurgery just prior to eval)   PALPATION: tenderness and tightness Rt lumbar paraspinals, different from primary symptoms; no tenderness and hypotonic paraspinals on Left PA glide to L4, L3 tender and congruent with primary pain; L2-T12 hypomobile and NOT tender   LUMBAR SPINE MOBILITY:   Segment  eval  Flexion Mildly maintained lordosis (hypomobile)  Extension Increased upper lumbar extension; WNL  Right rotation >70deg tolerates overpressure  Left rotation >70deg tolerates overpressure   (Blank rows = not tested)  LOWER EXTREMITY MOBILITY:         Hip  Range of Motion      Right  Left   Flexion Supine 110 121  External rotation (90/90) 50 58  Internal Rotation (90/90) 28 24  Prone Extension P/ROM WNL WNL   (Blank rows = not tested; did not test in prone due to habitus)  LOWER EXTREMITY STRENGTH:      BLE Strength Assessment (EVAL)     Right  Left  Hip Flexion 5 5  Hip Horizontal ABDCT  5 5  Hip Horizontal ADD 5 5  Hip IR (seated)  5 5  Hip ER (seated) 5 5  Knee Flexion (seated) 4+ 5  Ankle Dorsiflexion 5 5  Hip ABDCT (supine)  5 5  Hip Extension (Supine @ 30 degrees) 3+ 4-  (Blank rows = not tested)  LUMBAR SPECIAL TESTS:  Slump Test: not indicated, no leg symptoms  Passive Straight Leg Raise Test: negative  Extension+ Rotation Test: deferred, indicated by imaging  FUNCTIONAL TESTS:  30 sec chair rise: 9x, hands free (no pain exacerbation)  SLS: ~2s left, ~4-5sec Rt Deadbug Isometric endurance test: 60sec at 90/90, no pain exacerbation  GAIT ASSESSMENT: Distance walked: 1018ft as cued (4:17 or 1.62m/s) Assistive device utilized: none Level of assistance: Complete Independence Comments: very symmetrical, well formed heel strike, bilat small amplitude high velocity trendelenburg sign  *  no significant change in  gait quality over 106ft; no significant exacerbation in pain; no leg symptoms.   TODAY'S TREATMENT 02/02/24  -1030ft AMB overground, no device                                                                                                                          -30sec chair rise, hands free x9 -seated lumbar flexion stretch  2x45sec  -hip flexion/extension lunge stretch on training stairs with 2 railings  -hooklying marching isometric hold x 60sec (low effort, no shaking, no pain)  -standing marching 5lb AW (deferred for time)     PATIENT EDUCATION:  Education details: regional interdependence and how targeted interventions to hip and mid back can decrease load on low back joints.  Person educated: Patient Education method: collaborative learning, deliberate practice, positive reinforcement, explicit instruction, establish rules. Education comprehension: Pt gives verbal acknowledgement of these informations   HOME EXERCISE PROGRAM: Access Code: [email or web address] URL: [email or web address] Date: 02/02/2024 Prepared by: Alvera Novel  Exercises - Seated Lumbar Flexion Stretch  - 2 x daily - 3 reps - 60 hold - Hip flexor stretch on 2nd step  - 2 x daily - 2 reps - 60 hold - Isometric Dead Bug  - 1 x daily - 2 sets - 10 reps - 10 hold  ASSESSMENT:  CLINICAL IMPRESSION: 52yoM evaluated for progressive chronic low back pain, referred by neurosurgical who feels contribution from image supported facet changes. Pt shows limitations in strength, flexibility, activity tolerance. Pt also shows impairment of adjacent segments that could be addressed to a degree that would likely improve overall system performance and optimize activity tolerance. Commenced mobility and core strength interventions today. Patient will benefit from skilled physical therapy intervention to reduce deficits and impairments identified in evaluation, in order to reduce pain, improve quality of life, and maximize  activity tolerance for ADL, IADL, and leisure/fitness. Physical therapy will help pt achieve long and short term goals of care.   OBJECTIVE IMPAIRMENTS: Abnormal gait, decreased activity tolerance, difficulty walking, decreased strength, and impaired flexibility.   ACTIVITY LIMITATIONS: carrying, lifting, and standing  PARTICIPATION LIMITATIONS: cleaning, community activity, occupation, and yard work  PERSONAL FACTORS: Age, Behavior pattern, Fitness, and Time since onset of injury/illness/exacerbation are also affecting patient's functional outcome.   REHAB POTENTIAL: Good  CLINICAL DECISION MAKING: Stable/uncomplicated  EVALUATION COMPLEXITY: Moderate  GOALS: Goals reviewed with patient? No  SHORT TERM GOALS: Target date: 03/01/24  Patient will report comprehension, confidence, and consistent compliance and of a simple home exercise program established to facilitate symptoms management and basic strengthening and/or segment mobility.   Baseline: issued 02/02/24 Goal status: INITIAL  2.  Patient to demonstrate improved score on self-report measure by >2 points to indicate reduced self-reported disability and/or pain.   Baseline:  02/02/24: Roland-Morris 7/24 Goal status: INITIAL  LONG TERM GOALS: Target date: 05/01/24   1.  Patient to improve score on self-report measure by MCID or greater to indicate reduced disability  and improved quality of life.   Baseline: 02/02/24 roland-morris: 7/24 Goal status: INITIAL  2.  Patient to demonstrate improvement in strength testing by minimum of 1 grade to indicate improved motor performance.   Baseline: hip extension eval 3+/5 bilat  Goal status: INITIAL  3.  Patient to demonstrate improved performance on initial transfers assessment AEB improved reps per time or time per perform desired reps and/or reduced seat height and/or use of hands in order to improve safety, tolerance, and independence in ADL performance.   Baseline: eval 30sec chair  rise: 9 Goal status: INITIAL  4.  Patient to demonstrate improved performance on SLS balance to >30sec bilat to indicate improved motor control strategies at ankle/hip level  Baseline: <10sec bilat eval  Goal status: INITIAL  PLAN:  PT FREQUENCY: 1-2x/week  PT DURATION: 3 Months  PLANNED INTERVENTIONS: 97110-Therapeutic exercises, 97530- Therapeutic activity, 97112- Neuromuscular re-education, 97535- Self Care, 16109- Manual therapy, [id number]- Electrical stimulation (unattended), [phone]- Electrical stimulation (manual), Patient/Family education, Balance training, Stair training, Dry Needling, Spinal mobilization, Cryotherapy, and Moist heat.  PLAN FOR NEXT SESSION: Continue with stretch regimen, heat PRN for pain management, core strengthening,standing proprioceptive training for lumbar pelvis   9:18 PM, 02/02/24 Rosamaria Lints, PT, DPT Physical Therapist - [place] [phone] (Office)     Ladonya Jerkins C, PT 02/02/2024, 10:32 AM

## 2024-02-08 ENCOUNTER — Ambulatory Visit: Payer: 59 | Attending: Orthopedic Surgery | Admitting: Physical Therapy

## 2024-02-08 DIAGNOSIS — R262 Difficulty in walking, not elsewhere classified: Secondary | ICD-10-CM | POA: Insufficient documentation

## 2024-02-08 DIAGNOSIS — M5459 Other low back pain: Secondary | ICD-10-CM | POA: Insufficient documentation

## 2024-02-08 NOTE — Therapy (Signed)
 OUTPATIENT PHYSICAL THERAPY TREATMENT    Patient Name: Daniel [name] MRN: [id number] DOB:February 12, 1971, 53 y.o., male Today's Date: 02/08/2024  END OF SESSION:  PT End of Session - 02/08/24 1352     Visit Number 2    Number of Visits 24    Date for PT Re-Evaluation 05/01/24    Authorization Type UHC primary; Medicaid secondary    Authorization Time Period 02/02/24-05/01/24    Authorization - Number of Visits 2    Progress Note Due on Visit 10    PT Start Time 1347    PT Stop Time 1430    PT Time Calculation (min) 43 min    Activity Tolerance Patient tolerated treatment well;No increased pain;Patient limited by pain    Behavior During Therapy [facility] for tasks assessed/performed              Past Medical History:  Diagnosis Date   Asthma    Bipolar 1 disorder (HCC)    Chronic back pain    DDD (degenerative disc disease), cervical    Depression    Diabetes mellitus without complication (HCC)    ED (erectile dysfunction)    Elevated lipids    GERD (gastroesophageal reflux disease)    Headache    Insomnia    Migraine    1-2x/month since starting emgality   Seasonal allergies    Sleep apnea    Wears hearing aid in both ears    Wheezing    Past Surgical History:  Procedure Laterality Date   CHOLECYSTECTOMY     COLONOSCOPY W/ BIOPSIES     ESOPHAGOGASTRODUODENOSCOPY (EGD) WITH PROPOFOL N/A 03/17/2023   Procedure: ESOPHAGOGASTRODUODENOSCOPY (EGD) WITH PROPOFOL;  Surgeon: [name], MD;  Location: [facility];  Service: Endoscopy;  Laterality: N/A;   FOOT ARTHRODESIS Left 05/13/2023   Procedure: ARTHRODESIS FOOT;  Surgeon: [name], DPM;  Location: [facility];  Service: Podiatry;  Laterality: Left;  Diabetic   FRACTURE SURGERY     tail bone   knee arthroscpy Right    POSTERIOR CERVICAL LAMINECTOMY Right 11/19/2022   Procedure: RIGHT C5-6 FORAMINOTOMY;  Surgeon: Venetia Night, MD;  Location: [facility];  Service: Neurosurgery;  Laterality:  Right;   SHOULDER ARTHROSCOPY WITH DISTAL CLAVICLE RESECTION Right 10/13/2017   Procedure: SHOULDER ARTHROSCOPY WITH DISTAL CLAVICLE RESECTION;  Surgeon: Christena Flake, MD;  Location: [facility];  Service: Orthopedics;  Laterality: Right;   SHOULDER ARTHROSCOPY WITH OPEN ROTATOR CUFF REPAIR Right 10/13/2017   Procedure: SHOULDER ARTHROSCOPY WITH OPEN ROTATOR CUFF REPAIR/;  Surgeon: Christena Flake, MD;  Location: [facility];  Service: Orthopedics;  Laterality: Right;   TOTAL KNEE ARTHROPLASTY Right 12/06/2019   Procedure: TOTAL KNEE ARTHROPLASTY;  Surgeon: Kennedy Bucker, MD;  Location: [facility];  Service: Orthopedics;  Laterality: Right;   TOTAL KNEE ARTHROPLASTY Right 05/14/2021   Procedure: REVISION TOTAL KNEE ARTHROPLASTY - [place] Neighbor to Assist;  Surgeon: Kennedy Bucker, MD;  Location: [facility];  Service: Orthopedics;  Laterality: Right;   Patient Active Problem List   Diagnosis Date Noted   OSA (obstructive sleep apnea) 02/02/2023   Obesity (BMI 30-39.9) 02/02/2023   Cervical radiculopathy 11/19/2022   Bipolar II disorder, mild, hypomanic, with mixed features, in full remission (HCC) 09/30/2022   Photophobia 10/28/2021   S/P revision of total knee, 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   Elevated blood-pressure reading, without diagnosis of hypertension 01/30/2021   Left arm pain 11/27/2020   Nausea 11/27/2020   Headache disorder 03/30/2020   Status post total knee replacement  using cement, 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   Lower urinary tract symptoms (LUTS) 05/13/2019   Recurrent bronchospasm 03/15/2019   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   Incomplete tear of right rotator cuff 10/15/2017   Tendinitis of upper biceps tendon of right shoulder 10/15/2017   Seborrhea 09/23/2017   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 09/09/2017   Rotator cuff tendinitis, right 09/09/2017   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   Displacement of lumbar intervertebral disc without myelopathy  04/04/2015   Facet arthritis of lumbar region 03/14/2015   DDD (degenerative disc disease), lumbar 01/02/2015   Lumbar radiculitis 01/02/2015   DM2 (diabetes mellitus, type 2) (HCC) 10/30/2013   Knee joint effusion 03/14/2013   Left knee pain 03/14/2013   Articular cartilage disorder, other specified site 11/09/2012   Chronic back pain 09/10/2012    [facility]P: [name]  REFERRING PROVIDER: Drake Leach, PA [facility]) REFERRING DIAG: Lumbar spondylosis, radiculopathy  Rationale for Evaluation and Treatment: Rehabilitation  THERAPY DIAG:  No diagnosis found. ONSET DATE: 2005  SUBJECTIVE:                                                                                                                                                                                           SUBJECTIVE STATEMENT: Pt states that he has excruciating low back pain at the start of the session. He was able to do all the exercises without an increase in his pain.    PERTINENT HISTORY:  Daniel [name] is a 53yoM referred to OPPT by neurosurgery for lumbar spondylosis and radiculopathy. PMH: OSA, DM, BPD, asthma, obesity; PSH: Rt TKA revision 2022, Rt C5-6 foraminotomy 2023. Pt reports low back pain since 2005 after pulling a pt up in bed. (+) N/T and weakness, (-) leg pain. Symptoms exacerbated by standing and lifting. Pt has been taking Neurontin, Cymbalta, Celebrex without much relief.  Neurosurgical visit on 2/11 shows 5/5 BUE, BLE and symmetrical 2+ reflexes, intact LT sensation. Pt had lumbar MRI 11/27/23: showing mild/moderate DJD L5/S1, significant Rt facet arthropathy L4/S1.(Full report below). Pt reports persistent pain since 2005, but reports secondary point of exacerbation after a fall at work at [employer] about 3 years ago- at that time Daniel [name] in [facility] offering a lumbar fusion at that time but ultimately did not take place.   PAIN:  Are you having pain? Yes   -current: 8-9/10 central  low back;   -6/10 best pain  -worst pain 10/10; when much worse can have tingling in bilat legs buttocks to lateral ankles    PRECAUTIONS: None  WEIGHT BEARING RESTRICTIONS: No  FALLS:  Has patient fallen  in last 6 months? No falls history or reported balance problems.   LIVING ENVIRONMENT: Lives with: alone  Lives in: boarding house  Stairs: 2 railings  Has following equipment at home: none;   OCCUPATION: on disability   PLOF: On disability; walks for transportation to get to bus, occasionally limited by asthma attack   PATIENT GOALS: improve pain in his back to return to work   OBJECTIVE:  Note: Objective measures were completed at Evaluation unless otherwise noted.  DIAGNOSTIC FINDINGS:  MRI lumbar spine dated 11/27/23:  FINDINGS: Segmentation:  Standard.   Alignment:  No substantial sagittal subluxation. Vertebrae:  No fracture, evidence of discitis, or bone lesion. Conus medullaris and cauda equina: Conus extends to the L2 level. Conus and cauda equina appear normal. Paraspinal and other soft tissues: Unremarkable. Disc levels: T12-L1: No significant disc protrusion, foraminal stenosis, or canal stenosis.  L1-L2: No significant disc protrusion, foraminal stenosis, or canal stenosis.  L2-L3: No significant disc protrusion, foraminal stenosis, or canal stenosis.  L3-L4: Mild disc bulge. Bilateral facet arthropathy. No significant stenosis.  L4-L5: Significant right greater than left facet arthropathy. Mild disc bulging. Mild right greater than left foraminal stenosis. Patent canal.  L5-S1: Small central disc protrusion with annular fissure. Bilateral facet arthropathy. Mild bilateral foraminal stenosis. Patent canal.  IMPRESSION: 1. At L4-L5, significant right greater than left facet arthropathy which has progressed and could cause back pain. Mild right greater than left foraminal stenosis at this level. 2. At L5-S1, facet arthropathy mild bilateral  foraminal stenosis. Electronically Signed   By: Feliberto Harts M.D.   On: 12/08/2023 16:34   PATIENT SURVEYS:  Roland Morris Questionnaire:7/24 FABQ: total 75; PA: 21; W: 33  SENSATION: -deferred (already examined by neurosurgery just prior to eval)   PALPATION: tenderness and tightness Rt lumbar paraspinals, different from primary symptoms; no tenderness and hypotonic paraspinals on Left PA glide to L4, L3 tender and congruent with primary pain; L2-T12 hypomobile and NOT tender   LUMBAR SPINE MOBILITY:   Segment  eval  Flexion Mildly maintained lordosis (hypomobile)  Extension Increased upper lumbar extension; WNL  Right rotation >70deg tolerates overpressure  Left rotation >70deg tolerates overpressure   (Blank rows = not tested)  LOWER EXTREMITY MOBILITY:         Hip  Range of Motion      Right  Left   Flexion Supine 110 121  External rotation (90/90) 50 58  Internal Rotation (90/90) 28 24  Prone Extension P/ROM WNL WNL   (Blank rows = not tested; did not test in prone due to habitus)  LOWER EXTREMITY STRENGTH:      BLE Strength Assessment (EVAL)     Right  Left  Hip Flexion 5 5  Hip Horizontal ABDCT  5 5  Hip Horizontal ADD 5 5  Hip IR (seated)  5 5  Hip ER (seated) 5 5  Knee Flexion (seated) 4+ 5  Ankle Dorsiflexion 5 5  Hip ABDCT (supine)  5 5  Hip Extension (Supine @ 30 degrees) 3+ 4-  (Blank rows = not tested)  LUMBAR SPECIAL TESTS:  Slump Test: not indicated, no leg symptoms  Passive Straight Leg Raise Test: negative  Extension+ Rotation Test: deferred, indicated by imaging  FUNCTIONAL TESTS:  30 sec chair rise: 9x, hands free (no pain exacerbation)  SLS: ~2s left, ~4-5sec Rt Deadbug Isometric endurance test: 60sec at 90/90, no pain exacerbation  GAIT ASSESSMENT: Distance walked: 1031ft as cued (4:17 or 1.2m/s) Assistive device utilized: none  Level of assistance: Complete Independence Comments: very symmetrical, well formed heel strike,  bilat small amplitude high velocity trendelenburg sign  *no significant change in gait quality over 1085ft; no significant exacerbation in pain; no leg symptoms.   TODAY'S TREATMENT   02/08/24 THEREX    Nu-step seat and arms at 11 for 5 min  Lower Trunk Rotations 2 x 10  Hook lying Curl Up with Reach 2 x 10  Supine Bridges 1 x 10  Supine Bridges with Hip Abduction with green band 2 x 10  Standing Hip Abduction with BUE support  2 x 10  Standing Hip Abduction with 1 UE support  1 x 10  Standing Hip Abduction with 2 fingers 1 x 10  Butterfly with forward flexion for stretch 3 x 30 sec   Trigger Point Dry Needling  Initial Treatment: Pt instructed on Dry Needling rational, procedures, and possible side effects. Pt instructed to expect mild to moderate muscle soreness later in the day and/or into the next day.  Pt instructed in methods to reduce muscle soreness. Pt instructed to continue prescribed HEP. Because Dry Needling was performed over or adjacent to a lung field, pt was educated on S/S of pneumothorax and to seek immediate medical attention should they occur.  Patient was educated on signs and symptoms of infection and other risk factors and advised to seek medical attention should they occur.  Patient verbalized understanding of these instructions and education.   Patient Verbal Consent Given: Yes Education Handout Provided: No   Muscles Treated: Lumbar paraspinals  Electrical Stimulation Performed: No Treatment Response/Outcome: No trigger point relief or twitch experiences     02/02/24  -1057ft AMB overground, no device                                                                                                                          -30sec chair rise, hands free x9 -seated lumbar flexion stretch  2x45sec  -hip flexion/extension lunge stretch on training stairs with 2 railings  -hooklying marching isometric hold x 60sec (low effort, no shaking, no pain)  -standing  marching 5lb AW (deferred for time)     PATIENT EDUCATION:  Education details: regional interdependence and how targeted interventions to hip and mid back can decrease load on low back joints.  Person educated: Patient Education method: collaborative learning, deliberate practice, positive reinforcement, explicit instruction, establish rules. Education comprehension: Pt gives verbal acknowledgement of these informations   HOME EXERCISE PROGRAM: Access Code: [email or web address] URL: [email or web address] Date: 02/08/2024 Prepared by: Ellin Goodie  Exercises - Supine Lower Trunk Rotation  - 1 x daily - 3 sets - 10 reps - 3 sec hold - Bound Angle Hands Forward   - 1 x daily - 3 reps - 30 sec  hold - Hip flexor stretch on 2nd step  - 2 x daily - 2 reps - 60 hold - Curl Up with Reach  - 3-4 x weekly - 3 sets - 10 reps -  Supine Bridge with Resistance Band  - 3-4 x weekly - 3 sets - 10 reps  ASSESSMENT:  CLINICAL IMPRESSION:  Despite elevated pain, pt was able to perform all exercises without an increase in his pain. He did not respond to trigger point indicating likelihood of more joint related pain rather than muscular.  He will continue to benefit from skilled physical therapy intervention to reduce deficits and impairments identified in evaluation, in order to reduce pain, improve quality of life, and maximize activity tolerance for ADL, IADL, and leisure/fitness. Physical therapy will help pt achieve long and short term goals of care.   OBJECTIVE IMPAIRMENTS: Abnormal gait, decreased activity tolerance, difficulty walking, decreased strength, and impaired flexibility.   ACTIVITY LIMITATIONS: carrying, lifting, and standing  PARTICIPATION LIMITATIONS: cleaning, community activity, occupation, and yard work  PERSONAL FACTORS: Age, Behavior pattern, Fitness, and Time since onset of injury/illness/exacerbation are also affecting patient's functional outcome.   REHAB POTENTIAL:  Good  CLINICAL DECISION MAKING: Stable/uncomplicated  EVALUATION COMPLEXITY: Moderate  GOALS: Goals reviewed with patient? No  SHORT TERM GOALS: Target date: 03/01/24  Patient will report comprehension, confidence, and consistent compliance and of a simple home exercise program established to facilitate symptoms management and basic strengthening and/or segment mobility.   Baseline: issued 02/02/24 Goal status: ONGOING   2.  Patient to demonstrate improved score on self-report measure by >2 points to indicate reduced self-reported disability and/or pain.   Baseline:  02/02/24: Roland-Morris 7/24 Goal status: ONGOING   LONG TERM GOALS: Target date: 05/01/24   1.  Patient to improve score on self-report measure by MCID or greater to indicate reduced disability and improved quality of life.   Baseline: 02/02/24 roland-morris: 7/24 Goal status: ONGOING   2.  Patient to demonstrate improvement in strength testing by minimum of 1 grade to indicate improved motor performance.   Baseline: hip extension eval 3+/5 bilat  Goal status: ONGOING   3.  Patient to demonstrate improved performance on initial transfers assessment AEB improved reps per time or time per perform desired reps and/or reduced seat height and/or use of hands in order to improve safety, tolerance, and independence in ADL performance.   Baseline: eval 30sec chair rise: 9 Goal status: ONGOING   4.  Patient to demonstrate improved performance on SLS balance to >30sec bilat to indicate improved motor control strategies at ankle/hip level  Baseline: <10sec bilat eval  Goal status: ONGOING   PLAN:  PT FREQUENCY: 1-2x/week  PT DURATION: 3 Months  PLANNED INTERVENTIONS: 97110-Therapeutic exercises, 97530- Therapeutic activity, 97112- Neuromuscular re-education, 97535- Self Care, 10272- Manual therapy, G0283- Electrical stimulation (unattended), [phone]- Electrical stimulation (manual), Patient/Family education, Balance training,  Stair training, Dry Needling, Spinal mobilization, Cryotherapy, and Moist heat.  PLAN FOR NEXT SESSION: Anterior pelvic tilts and OMEGA Palloff press. Periscapular strength assessment.     Ellin Goodie PT, DPT  [facility] [address] [place], [address] Phone: [phone]   Fax:  [phone]

## 2024-02-09 ENCOUNTER — Encounter: Payer: Self-pay | Admitting: Orthopedic Surgery

## 2024-02-09 NOTE — Telephone Encounter (Signed)
 Lumbar xrays dated 01/25/24:  FINDINGS: Normal anatomic alignment. No active or static listhesis. Preservation of the vertebral body heights. Lower thoracic spine degenerative changes. Relative preservation of the lumbar spine disc space heights. Extensive lower lumbar spine facet degenerative changes most pronounced L4-5 and L5-S1 unremarkable SI joints. Cholecystectomy clips.   IMPRESSION: Lower lumbar spine facet degenerative changes.     Electronically Signed   By: Annia Belt M.D.   On: 02/09/2024 13:18  I have personally reviewed the images and agree with the above interpretation.

## 2024-02-12 NOTE — Progress Notes (Signed)
 Referring Physician:  [facility] [address] [place],  [place] [address]  Primary Physician:  [facility]  History of Present Illness: 02/17/2024 Daniel [name] has a history of OSA, DM, bipolar disorder, asthma, and obesity.   He has history of right C5-C6 foraminotomy by Dr. Myer Haff on 11/19/22.   Last seen by me on 01/19/24 for  constant LBP that started in 2005 after he pulled a patient up in bed. No leg pain, but he has numbness and tingling. He notes right leg weakness as well.    He has history of coccygectomy years ago. He has known lumbar spondylosis with mild right > left foraminal stenosis L4-L5 and mild bilateral foraminal stenosis L5-S1.   He has been attending PT (2 visits) and has not seen any relief. He is here to discuss further treatment options.   He continues with constant LBP with no leg pain. No numbness or tingling in his legs. He has weakness in right leg. Pain is worse with standing and lifting. Some relief with heating pad.   He is on neurontin, cymbalta, and celebrex with minimal relief.   His last HgbA1c was 6.3 on 12/07/23.   He does not smoke.   Bowel/Bladder Dysfunction: none. No perineal numbness.   Conservative measures:  Physical therapy:  initial eval on 02/02/24 with another visit on 02/08/24 Multimodal medical therapy including regular antiinflammatories:  Methocarbamol, Gabapentin, celebrex, cymbalta Injections:  Did in past with no relief.   Past Surgery:  11/19/2022 RIGHT C5-6 FORAMINOTOMY by Dr. Myer Haff Coccygectomy years ago  Daniel [name] has no symptoms of cervical myelopathy.  The symptoms are causing a significant impact on the patient's life.   Review of Systems:  A 10 point review of systems is negative, except for the pertinent positives and negatives detailed in the HPI.  Past Medical History: Past Medical History:  Diagnosis Date   Asthma    Bipolar 1 disorder (HCC)    Chronic back  pain    DDD (degenerative disc disease), cervical    Depression    Diabetes mellitus without complication (HCC)    ED (erectile dysfunction)    Elevated lipids    GERD (gastroesophageal reflux disease)    Headache    Insomnia    Migraine    1-2x/month since starting emgality   Seasonal allergies    Sleep apnea    Wears hearing aid in both ears    Wheezing     Past Surgical History: Past Surgical History:  Procedure Laterality Date   CHOLECYSTECTOMY     COLONOSCOPY W/ BIOPSIES     ESOPHAGOGASTRODUODENOSCOPY (EGD) WITH PROPOFOL N/A 03/17/2023   Procedure: ESOPHAGOGASTRODUODENOSCOPY (EGD) WITH PROPOFOL;  Surgeon: [name], MD;  Location: [facility];  Service: Endoscopy;  Laterality: N/A;   FOOT ARTHRODESIS Left 05/13/2023   Procedure: ARTHRODESIS FOOT;  Surgeon: [name], DPM;  Location: [facility];  Service: Podiatry;  Laterality: Left;  Diabetic   FRACTURE SURGERY     tail bone   knee arthroscpy Right    POSTERIOR CERVICAL LAMINECTOMY Right 11/19/2022   Procedure: RIGHT C5-6 FORAMINOTOMY;  Surgeon: Venetia Night, MD;  Location: [facility] OR[facility];  Service: Neurosurgery;  Laterality: Right;   SHOULDER ARTHROSCOPY WITH DISTAL CLAVICLE RESECTION Right 10/13/2017   Procedure: SHOULDER ARTHROSCOPY WITH DISTAL CLAVICLE RESECTION;  Surgeon: Christena Flake, MD;  Location: [facility];  Service: Orthopedics;  Laterality: Right;   SHOULDER ARTHROSCOPY WITH OPEN ROTATOR CUFF REPAIR Right  10/13/2017   Procedure: SHOULDER ARTHROSCOPY WITH OPEN ROTATOR CUFF REPAIR/;  Surgeon: Christena Flake, MD;  Location: [facility];  Service: Orthopedics;  Laterality: Right;   TOTAL KNEE ARTHROPLASTY Right 12/06/2019   Procedure: TOTAL KNEE ARTHROPLASTY;  Surgeon: Kennedy Bucker, MD;  Location: [facility];  Service: Orthopedics;  Laterality: Right;   TOTAL KNEE ARTHROPLASTY Right 05/14/2021   Procedure: REVISION TOTAL KNEE ARTHROPLASTY - [place] Neighbor to Assist;  Surgeon: Kennedy Bucker, MD;   Location: [facility];  Service: Orthopedics;  Laterality: Right;    Allergies: Allergies as of 02/17/2024 - Review Complete 02/17/2024  Allergen Reaction Noted   Doxycycline Nausea And Vomiting 03/03/2016   Ketorolac Itching 08/22/2018   Omega 3-6-9 Other (See Comments) 05/23/2014   Omega-3  05/23/2014   Other Other (See Comments) 05/23/2014   Parabens Other (See Comments) 05/23/2014   Tramadol Itching and Other (See Comments) 03/14/2015   Penicillins Hives, Rash, and Other (See Comments) 1971-12-03    Medications: Outpatient Encounter Medications as of 02/17/2024  Medication Sig   albuterol (PROVENTIL HFA;VENTOLIN HFA) 108 (90 Base) MCG/ACT inhaler Inhale 1 puff into the lungs every 4 (four) hours as needed for wheezing or shortness of breath.    albuterol (PROVENTIL) (2.5 MG/3ML) 0.083% nebulizer solution Take 2.5 mg by nebulization every 4 (four) hours as needed for wheezing.    atorvastatin (LIPITOR) 40 MG tablet Take 40 mg by mouth at bedtime.   budesonide (PULMICORT) 1 MG/2ML nebulizer solution INHALE 1 VIAL VIA NEBULIZER ONCE DAILY   busPIRone (BUSPAR) 5 MG tablet Take 1 tablet by mouth 2 (two) times daily.   celecoxib (CELEBREX) 200 MG capsule Take 1 capsule by mouth 2 (two) times daily.   Continuous Blood Gluc Sensor (FREESTYLE LIBRE 3 SENSOR) MISC As directedfor glucose monitoring   divalproex (DEPAKOTE ER) 250 MG 24 hr tablet Take 250 mg by mouth in the morning and at bedtime.   DULoxetine (CYMBALTA) 20 MG capsule Take 20 mg by mouth at bedtime.   empagliflozin (JARDIANCE) 10 MG TABS tablet Take 10 mg by mouth daily.   emtricitabine-tenofovir AF (DESCOVY) 200-25 MG tablet Take 1 tablet by mouth daily.   famotidine (PEPCID) 20 MG tablet Take 20 mg by mouth 2 (two) times daily.   FLUoxetine (PROZAC) 20 MG capsule Take 20 mg by mouth daily.   gabapentin (NEURONTIN) 300 MG capsule Take 600 mg by mouth 2 (two) times daily.   Galcanezumab-gnlm (EMGALITY) 120 MG/ML SOAJ Inject 120  mg into the skin every 30 (thirty) days.   glipiZIDE (GLUCOTROL XL) 5 MG 24 hr tablet Take 5 mg by mouth daily.   Lancets (ONETOUCH DELICA PLUS LANCET33G) MISC See admin instructions.   Melatonin 3 MG TABS Take 3 mg by mouth at bedtime.    montelukast (SINGULAIR) 10 MG tablet Take 10 mg by mouth at bedtime.   Multiple Vitamin (MULTIVITAMIN WITH MINERALS) TABS tablet Take 1 tablet by mouth daily.   nortriptyline (PAMELOR) 50 MG capsule at bedtime. Take 40mg  nightly for one week, then increase to 50mg  nightly   ONETOUCH ULTRA test strip    pantoprazole (PROTONIX) 40 MG tablet Take 40 mg by mouth 2 (two) times daily.   propranolol (INDERAL) 10 MG tablet Take 10 mg by mouth 2 (two) times daily.   pseudoephedrine-guaifenesin (MUCINEX D) 60-600 MG 12 hr tablet Take 1 tablet by mouth every 12 (twelve) hours as needed.   rizatriptan (MAXALT) 10 MG tablet May take a second dose after 2 hours if needed.  sitaGLIPtin (JANUVIA) 100 MG tablet Take 100 mg by mouth daily.   tadalafil (CIALIS) 5 MG tablet Take 1 tablet (5 mg total) by mouth daily.   tamsulosin (FLOMAX) 0.4 MG CAPS capsule Take 1 capsule (0.4 mg total) by mouth daily.   traZODone (DESYREL) 150 MG tablet Take 150 mg by mouth at bedtime.   triamcinolone cream (KENALOG) 0.1 % Apply 1 Application topically 2 (two) times daily.   zonisamide (ZONEGRAN) 100 MG capsule Take 100 mg by mouth daily.   No facility-administered encounter medications on file as of 02/17/2024.    Social History: Social History   Tobacco Use   Smoking status: Former    Current packs/day: 0.00    Types: Cigarettes    Quit date: 11/28/2014    Years since quitting: 9.2   Smokeless tobacco: Never  Vaping Use   Vaping status: Never Used  Substance Use Topics   Alcohol use: No    Alcohol/week: 0.0 standard drinks of alcohol   Drug use: No    Family Medical History: Family History  Problem Relation Age of Onset   Diabetes Mother    Hypertension Mother    Stroke  Father     Physical Examination: Vitals:   02/17/24 1021  BP: 130/82      Awake, alert, oriented to person, place, and time.  Speech is clear and fluent. Fund of knowledge is appropriate.   Cranial Nerves: Pupils equal round and reactive to light.  Facial tone is symmetric.    Mild diffuse lower lumbar tenderness. Pain with extension of lumbar spine.   No abnormal lesions on exposed skin.   Strength:  Side Iliopsoas Quads Hamstring PF DF EHL  R 5 5 5 5 5 5   L 5 5 5 5 5 5    Reflexes are 2+ and symmetric at the patella and achilles.    Clonus is not present.   Bilateral lower extremity sensation is intact to light touch.     No pain with IR/ER of both hips.   Gait is normal.    Medical Decision Making  Imaging: None  Assessment and Plan: Mr. Kliebert continues with constant LBP with no leg pain. No numbness or tingling in his legs. He has weakness in right leg. Pain is worse with standing and lifting.   He has history of coccygectomy years ago. He has known lumbar spondylosis with mild right > left foraminal stenosis L4-L5 and mild bilateral foraminal stenosis L5-S1.   LBP likely due to facets at L4-S1.   Treatment options discussed with patient and following plan made:   - Referral to pain management ([facility]) to discuss further options. Does not think he wants to do MBB/RFA- did years ago at [facility] with no relief. May be candidate for SCS or PNS?  - I recommend he continue with PT and give it more time, but if he stops then he should continue with HEP.  - Will message him after his visit with Dr. Cherylann Ratel to regroup.   I spent a total of 15 minutes in face-to-face and non-face-to-face activities related to this patient's care today including review of outside records, review of imaging, review of symptoms, physical exam, discussion of differential diagnosis, discussion of treatment options, and documentation.   Drake Leach PA-C Dept. of Neurosurgery

## 2024-02-17 ENCOUNTER — Ambulatory Visit: Admitting: Orthopedic Surgery

## 2024-02-17 ENCOUNTER — Encounter: Payer: Self-pay | Admitting: Orthopedic Surgery

## 2024-02-17 VITALS — BP 130/82 | Ht 69.0 in | Wt 223.0 lb

## 2024-02-17 DIAGNOSIS — M48061 Spinal stenosis, lumbar region without neurogenic claudication: Secondary | ICD-10-CM

## 2024-02-17 DIAGNOSIS — M47816 Spondylosis without myelopathy or radiculopathy, lumbar region: Secondary | ICD-10-CM

## 2024-02-17 NOTE — Patient Instructions (Signed)
 It was so nice to see you today. Thank you so much for coming in.    I would recommend giving PT more time, but you can stop if you want. I would continue doing your home exercises.  I want you to see pain management here in [place] (Dr. Cherylann Ratel) to discuss further treatment options for your back. They should call you to schedule an appointment or you can call them at [phone].   Please do not hesitate to call if you have any questions or concerns. You can also message me in MyChart.   I will send you a message after you see Dr. Cherylann Ratel so we can regroup.   Drake Leach PA-C [phone]     The physicians and staff at [facility] Neurosurgery at [facility] are committed to providing excellent care. You may receive a survey asking for feedback about your experience at our office. We value you your feedback and appreciate you taking the time to to fill it out. The [facility] leadership team is also available to discuss your experience in person, feel free to contact us [phone].

## 2024-02-18 ENCOUNTER — Ambulatory Visit (INDEPENDENT_AMBULATORY_CARE_PROVIDER_SITE_OTHER): Payer: Self-pay | Admitting: Physician Assistant

## 2024-02-18 ENCOUNTER — Encounter: Payer: Self-pay | Admitting: Physician Assistant

## 2024-02-18 VITALS — BP 128/84 | HR 101 | Ht 69.0 in | Wt 203.0 lb

## 2024-02-18 DIAGNOSIS — N3941 Urge incontinence: Secondary | ICD-10-CM

## 2024-02-18 DIAGNOSIS — R399 Unspecified symptoms and signs involving the genitourinary system: Secondary | ICD-10-CM

## 2024-02-18 LAB — MICROSCOPIC EXAMINATION: Bacteria, UA: NONE SEEN

## 2024-02-18 LAB — URINALYSIS, COMPLETE
Bilirubin, UA: NEGATIVE
Ketones, UA: NEGATIVE
Leukocytes,UA: NEGATIVE
Nitrite, UA: NEGATIVE
Protein,UA: NEGATIVE
RBC, UA: NEGATIVE
Specific Gravity, UA: 1.01 (ref 1.005–1.030)
Urobilinogen, Ur: 0.2 mg/dL (ref 0.2–1.0)
pH, UA: 6.5 (ref 5.0–7.5)

## 2024-02-18 LAB — BLADDER SCAN AMB NON-IMAGING

## 2024-02-18 NOTE — Progress Notes (Signed)
 02/18/2024 10:35 AM   Daniel [name] [birth year] [id number]  CC: Chief Complaint  Patient presents with   Follow-up   HPI: Daniel [name] is a 53 y.o. male with PMH diabetes on Jardiance, ED and LUTS on tadalafil 5 mg daily, and hypogonadism previously on TRT who presents today for symptom recheck on Flomax.   When I last saw him in clinic, he reported bothersome frequency, hesitancy, and weak stream.  Today he reports slight improvement in his voiding symptoms, but he is not yet at his treatment goal.  He describes daytime frequency x 2-4; no nocturia, urgency, or urge incontinence.  He is not double voiding.  When asked to clarify, he states he feels the sensation of incomplete bladder emptying.  In-office UA today positive for 3+ glucose; urine microscopy pan negative. PVR 31mL.  IPSS 8/terrible as below, previously 12/terrible.   IPSS     Row Name 02/18/24 1000         International Prostate Symptom Score   How often have you had the sensation of not emptying your bladder? Not at All     How often have you had to urinate less than every two hours? About half the time     How often have you found you stopped and started again several times when you urinated? Not at All     How often have you found it difficult to postpone urination? Almost always     How often have you had a weak urinary stream? Not at All     How often have you had to strain to start urination? Not at All     How many times did you typically get up at night to urinate? None     Total IPSS Score 8       Quality of Life due to urinary symptoms   If you were to spend the rest of your life with your urinary condition just the way it is now how would you feel about that? Terrible               PMH: Past Medical History:  Diagnosis Date   Asthma    Bipolar 1 disorder (HCC)    Chronic back pain    DDD (degenerative disc disease), cervical    Depression    Diabetes mellitus without complication (HCC)     ED (erectile dysfunction)    Elevated lipids    GERD (gastroesophageal reflux disease)    Headache    Insomnia    Migraine    1-2x/month since starting emgality   Seasonal allergies    Sleep apnea    Wears hearing aid in both ears    Wheezing     Surgical History: Past Surgical History:  Procedure Laterality Date   CHOLECYSTECTOMY     COLONOSCOPY W/ BIOPSIES     ESOPHAGOGASTRODUODENOSCOPY (EGD) WITH PROPOFOL N/A 03/17/2023   Procedure: ESOPHAGOGASTRODUODENOSCOPY (EGD) WITH PROPOFOL;  Surgeon: [name], MD;  Location: [facility];  Service: Endoscopy;  Laterality: N/A;   FOOT ARTHRODESIS Left 05/13/2023   Procedure: ARTHRODESIS FOOT;  Surgeon: [name], DPM;  Location: [facility];  Service: Podiatry;  Laterality: Left;  Diabetic   FRACTURE SURGERY     tail bone   knee arthroscpy Right    POSTERIOR CERVICAL LAMINECTOMY Right 11/19/2022   Procedure: RIGHT C5-6 FORAMINOTOMY;  Surgeon: Venetia Night, MD;  Location: [facility];  Service: Neurosurgery;  Laterality: Right;   SHOULDER ARTHROSCOPY WITH DISTAL  CLAVICLE RESECTION Right 10/13/2017   Procedure: SHOULDER ARTHROSCOPY WITH DISTAL CLAVICLE RESECTION;  Surgeon: Christena Flake, MD;  Location: [facility];  Service: Orthopedics;  Laterality: Right;   SHOULDER ARTHROSCOPY WITH OPEN ROTATOR CUFF REPAIR Right 10/13/2017   Procedure: SHOULDER ARTHROSCOPY WITH OPEN ROTATOR CUFF REPAIR/;  Surgeon: Christena Flake, MD;  Location: [facility];  Service: Orthopedics;  Laterality: Right;   TOTAL KNEE ARTHROPLASTY Right 12/06/2019   Procedure: TOTAL KNEE ARTHROPLASTY;  Surgeon: Kennedy Bucker, MD;  Location: [facility];  Service: Orthopedics;  Laterality: Right;   TOTAL KNEE ARTHROPLASTY Right 05/14/2021   Procedure: REVISION TOTAL KNEE ARTHROPLASTY - [place] Neighbor to Assist;  Surgeon: Kennedy Bucker, MD;  Location: [facility];  Service: Orthopedics;  Laterality: Right;    Home Medications:  Allergies as of 02/18/2024        Reactions   Doxycycline Nausea And Vomiting   Ketorolac Itching   Other reaction(s): Unknown   Omega 3-6-9 Other (See Comments)   By allergy testing   Omega-3    By allergy testing   Other Other (See Comments)   Confirmed by allergy testing Mercaptobenzothiazole Able to wear latex gloves   Parabens Other (See Comments)   Confirmed by allergy testing Able to wear latex gloves   Tramadol Itching, Other (See Comments)   Tolerates extended release Other reaction(s):    Penicillins Hives, Rash, Other (See Comments)   Has patient had a PCN reaction causing immediate rash, facial/tongue/throat swelling, SOB or lightheadedness with hypotension: Yes Has patient had a PCN reaction causing severe rash involving mucus membranes or skin necrosis: No Has patient had a PCN reaction that required hospitalization No Has patient had a PCN reaction occurring within the last 10 years: No If all of the above answers are "NO", then may proceed with Cephalosporin use. Other reaction(s):  Can take keflex        Medication List        Accurate as of February 18, 2024 10:35 AM. If you have any questions, ask your nurse or doctor.          albuterol 108 (90 Base) MCG/ACT inhaler Commonly known as: VENTOLIN HFA Inhale 1 puff into the lungs every 4 (four) hours as needed for wheezing or shortness of breath.   albuterol (2.5 MG/3ML) 0.083% nebulizer solution Commonly known as: PROVENTIL Take 2.5 mg by nebulization every 4 (four) hours as needed for wheezing.   atorvastatin 40 MG tablet Commonly known as: LIPITOR Take 40 mg by mouth at bedtime.   budesonide 1 MG/2ML nebulizer solution Commonly known as: PULMICORT INHALE 1 VIAL VIA NEBULIZER ONCE DAILY   busPIRone 5 MG tablet Commonly known as: BUSPAR Take 1 tablet by mouth 2 (two) times daily.   celecoxib 200 MG capsule Commonly known as: CELEBREX Take 1 capsule by mouth 2 (two) times daily.   Descovy 200-25 MG tablet Generic drug:  emtricitabine-tenofovir AF Take 1 tablet by mouth daily.   divalproex 250 MG 24 hr tablet Commonly known as: DEPAKOTE ER Take 250 mg by mouth in the morning and at bedtime.   DULoxetine 20 MG capsule Commonly known as: CYMBALTA Take 20 mg by mouth at bedtime.   Emgality 120 MG/ML Soaj Generic drug: Galcanezumab-gnlm Inject 120 mg into the skin every 30 (thirty) days.   famotidine 20 MG tablet Commonly known as: PEPCID Take 20 mg by mouth 2 (two) times daily.   FLUoxetine 20 MG capsule Commonly known as: PROZAC Take 20 mg by mouth daily.  FreeStyle Libre 3 Sensor Misc As directedfor glucose monitoring   gabapentin 300 MG capsule Commonly known as: NEURONTIN Take 600 mg by mouth 2 (two) times daily.   glipiZIDE 5 MG 24 hr tablet Commonly known as: GLUCOTROL XL Take 5 mg by mouth daily.   Jardiance 10 MG Tabs tablet Generic drug: empagliflozin Take 10 mg by mouth daily.   melatonin 3 MG Tabs tablet Take 3 mg by mouth at bedtime.   montelukast 10 MG tablet Commonly known as: SINGULAIR Take 10 mg by mouth at bedtime.   multivitamin with minerals Tabs tablet Take 1 tablet by mouth daily.   nortriptyline 50 MG capsule Commonly known as: PAMELOR at bedtime. Take 40mg  nightly for one week, then increase to 50mg  nightly   OneTouch Delica Plus Lancet33G Misc See admin instructions.   OneTouch Ultra test strip Generic drug: glucose blood   pantoprazole 40 MG tablet Commonly known as: PROTONIX Take 40 mg by mouth 2 (two) times daily.   propranolol 10 MG tablet Commonly known as: INDERAL Take 10 mg by mouth 2 (two) times daily.   pseudoephedrine-guaifenesin 60-600 MG 12 hr tablet Commonly known as: MUCINEX D Take 1 tablet by mouth every 12 (twelve) hours as needed.   rizatriptan 10 MG tablet Commonly known as: MAXALT May take a second dose after 2 hours if needed.   sitaGLIPtin 100 MG tablet Commonly known as: JANUVIA Take 100 mg by mouth daily.    tadalafil 5 MG tablet Commonly known as: CIALIS Take 1 tablet (5 mg total) by mouth daily.   tamsulosin 0.4 MG Caps capsule Commonly known as: FLOMAX Take 1 capsule (0.4 mg total) by mouth daily.   traZODone 150 MG tablet Commonly known as: DESYREL Take 150 mg by mouth at bedtime.   triamcinolone cream 0.1 % Commonly known as: KENALOG Apply 1 Application topically 2 (two) times daily.   zonisamide 100 MG capsule Commonly known as: ZONEGRAN Take 100 mg by mouth daily.        Allergies:  Allergies  Allergen Reactions   Doxycycline Nausea And Vomiting   Ketorolac Itching    Other reaction(s): Unknown   Omega 3-6-9 Other (See Comments)    By allergy testing   Omega-3     By allergy testing    Other Other (See Comments)    Confirmed by allergy testing Mercaptobenzothiazole Able to wear latex gloves   Parabens Other (See Comments)    Confirmed by allergy testing Able to wear latex gloves    Tramadol Itching and Other (See Comments)    Tolerates extended release Other reaction(s):    Penicillins Hives, Rash and Other (See Comments)    Has patient had a PCN reaction causing immediate rash, facial/tongue/throat swelling, SOB or lightheadedness with hypotension: Yes Has patient had a PCN reaction causing severe rash involving mucus membranes or skin necrosis: No Has patient had a PCN reaction that required hospitalization No Has patient had a PCN reaction occurring within the last 10 years: No If all of the above answers are "NO", then may proceed with Cephalosporin use. Other reaction(s):  Can take keflex    Family History: Family History  Problem Relation Age of Onset   Diabetes Mother    Hypertension Mother    Stroke Father     Social History:   reports that he quit smoking about 9 years ago. His smoking use included cigarettes. He has never used smokeless tobacco. He reports that he does not drink alcohol and does  not use drugs.  Physical Exam: BP  128/84   Pulse (!) 101   Ht 5\' 9"  (1.753 m)   Wt 203 lb (92.1 kg)   BMI 29.98 kg/m   Constitutional:  Alert and oriented, no acute distress, nontoxic appearing HEENT: [place], AT Cardiovascular: No clubbing, cyanosis, or edema Respiratory: Normal respiratory effort, no increased work of breathing Skin: No rashes, bruises or suspicious lesions Neurologic: Grossly intact, no focal deficits, moving all 4 extremities Psychiatric: Normal mood and affect  Laboratory Data: Results for orders placed or performed in visit on 02/18/24  Microscopic Examination   Collection Time: 02/18/24  9:44 AM   Urine  Result Value Ref Range   WBC, UA 0-5 0 - 5 /hpf   RBC, Urine 0-2 0 - 2 /hpf   Epithelial Cells (non renal) 0-10 0 - 10 /hpf   Bacteria, UA None seen None seen/Few  Urinalysis, Complete   Collection Time: 02/18/24  9:44 AM  Result Value Ref Range   Specific Gravity, UA 1.010 1.005 - 1.030   pH, UA 6.5 5.0 - 7.5   Color, UA Yellow Yellow   Appearance Ur Clear Clear   Leukocytes,UA Negative Negative   Protein,UA Negative Negative/Trace   Glucose, UA 3+ (A) Negative   Ketones, UA Negative Negative   RBC, UA Negative Negative   Bilirubin, UA Negative Negative   Urobilinogen, Ur 0.2 0.2 - 1.0 mg/dL   Nitrite, UA Negative Negative   Microscopic Examination See below:   BLADDER SCAN AMB NON-IMAGING   Collection Time: 02/18/24 10:01 AM  Result Value Ref Range   Scan Result 31ml    Assessment & Plan:   1. Lower urinary tract symptoms (LUTS) (Primary) IPSS versus verbal symptom report are rather incongruous today and his voiding pattern per verbal report is within normal limits.  He is emptying appropriately.  I think we need to have reasonable treatment goals.  I was honest with him that I am not sure how much better we are going to be able to get his symptoms.  I offered him cystoscopy to complete his workup and he desires to pursue this.  Will keep him on Flomax and tadalafil in the  meantime. - Urinalysis, Complete - BLADDER SCAN AMB NON-IMAGING   Return in about 4 weeks (around 03/17/2024) for Cysto with Dr. Richardo Hanks.  Carman Ching, PA-C  [facility] [place] [address] [place], [place] [address] [phone]

## 2024-02-18 NOTE — Patient Instructions (Signed)

## 2024-03-02 ENCOUNTER — Other Ambulatory Visit: Payer: Self-pay

## 2024-03-02 DIAGNOSIS — R399 Unspecified symptoms and signs involving the genitourinary system: Secondary | ICD-10-CM

## 2024-03-02 MED ORDER — TAMSULOSIN HCL 0.4 MG PO CAPS: 0.4000 mg | ORAL_CAPSULE | Freq: Every day | ORAL | 11 refills | Status: DC

## 2024-03-08 ENCOUNTER — Ambulatory Visit: Payer: 59 | Admitting: Orthopedic Surgery

## 2024-03-08 ENCOUNTER — Ambulatory Visit: Payer: 59 | Admitting: Physical Therapy

## 2024-03-08 ENCOUNTER — Ambulatory Visit: Admitting: Sports Medicine

## 2024-03-10 ENCOUNTER — Ambulatory Visit: Payer: 59 | Admitting: Physical Therapy

## 2024-03-14 ENCOUNTER — Encounter: Payer: 59 | Admitting: Physical Therapy

## 2024-03-17 ENCOUNTER — Encounter: Payer: 59 | Admitting: Physical Therapy

## 2024-03-21 ENCOUNTER — Encounter: Payer: 59 | Admitting: Physical Therapy

## 2024-03-22 ENCOUNTER — Encounter: Payer: 59 | Admitting: Physical Therapy

## 2024-03-24 ENCOUNTER — Encounter: Payer: 59 | Admitting: Physical Therapy

## 2024-03-29 ENCOUNTER — Encounter: Payer: 59 | Admitting: Physical Therapy

## 2024-03-31 ENCOUNTER — Encounter: Payer: 59 | Admitting: Physical Therapy

## 2024-04-05 ENCOUNTER — Encounter: Payer: 59 | Admitting: Physical Therapy

## 2024-04-07 ENCOUNTER — Encounter: Payer: 59 | Admitting: Physical Therapy

## 2024-04-07 ENCOUNTER — Other Ambulatory Visit: Admitting: Urology

## 2024-04-12 ENCOUNTER — Encounter: Payer: 59 | Admitting: Physical Therapy

## 2024-04-13 ENCOUNTER — Encounter: Payer: 59 | Admitting: Physical Therapy

## 2024-04-19 ENCOUNTER — Encounter: Payer: 59 | Admitting: Physical Therapy

## 2024-04-21 ENCOUNTER — Encounter: Payer: 59 | Admitting: Physical Therapy

## 2024-05-01 ENCOUNTER — Other Ambulatory Visit: Payer: Self-pay

## 2024-05-01 ENCOUNTER — Emergency Department
Admission: EM | Admit: 2024-05-01 | Discharge: 2024-05-02 | Disposition: A | Discharge status: Home / Self Care | Attending: Emergency Medicine | Admitting: Emergency Medicine

## 2024-05-01 DIAGNOSIS — S61012A Laceration without foreign body of left thumb without damage to nail, initial encounter: Secondary | ICD-10-CM | POA: Insufficient documentation

## 2024-05-01 DIAGNOSIS — E119 Type 2 diabetes mellitus without complications: Secondary | ICD-10-CM | POA: Insufficient documentation

## 2024-05-01 DIAGNOSIS — S6992XA Unspecified injury of left wrist, hand and finger(s), initial encounter: Secondary | ICD-10-CM | POA: Diagnosis present

## 2024-05-01 DIAGNOSIS — W272XXA Contact with scissors, initial encounter: Secondary | ICD-10-CM | POA: Diagnosis not present

## 2024-05-01 NOTE — ED Provider Notes (Signed)
 [facility] Provider Note   Event Date/Time   First MD Initiated Contact with Patient 05/01/24 2258     (approximate) History  Laceration HPI Daniel [name] is a 53 y.o. male with a past medical history of type 2 diabetes, hyperlipidemia, bipolar disorder, and migraines who presents complaining of a patient currently denies any vision changes, tinnitus, difficulty speaking, facial droop, sore throat, chest pain, shortness of breath, abdominal pain, nausea/vomiting/diarrhea, dysuria, or weakness/numbness/paresthesias in any extremity stab laceration to the left thumb after he was using scissors at home and they slipped.  Patient has a subcentimeter laceration to the palmar distal aspect of the left thumb that is oozing dark blood.  Patient denies any blood thinner use or history of bleeding/coagulopathies ROS: Patient currently denies any vision changes, tinnitus, difficulty speaking, facial droop, sore throat, chest pain, shortness of breath, abdominal pain, nausea/vomiting/diarrhea, dysuria, or weakness/numbness/paresthesias in any extremity   Physical Exam  Triage Vital Signs: ED Triage Vitals  Encounter Vitals Group     BP 05/01/24 2232 123/82     Systolic BP Percentile --      Diastolic BP Percentile --      Pulse Rate 05/01/24 2229 98     Resp 05/01/24 2229 19     Temp 05/01/24 2229 98 F (36.7 C)     Temp Source 05/01/24 2229 Oral     SpO2 05/01/24 2229 100 %     Weight 05/01/24 2230 218 lb (98.9 kg)     Height 05/01/24 2230 5\' 9"  (1.753 m)     Head Circumference --      Peak Flow --      Pain Score 05/01/24 2230 10     Pain Loc --      Pain Education --      Exclude from Growth Chart --    Most recent vital signs: Vitals:   05/01/24 2229 05/01/24 2232  BP:  123/82  Pulse: 98   Resp: 19   Temp: 98 F (36.7 C) 98.4 F (36.9 C)  SpO2: 100%    General: Awake, oriented x4. CV:  Good peripheral perfusion.  Resp:  Normal effort.  Abd:  No  distention.  Other:  Middle-aged obese Caucasian male resting comfortably in no acute distress.  Subcentimeter laceration to the palmarar lateral distal aspect of the left thumb ED Results / Procedures / Treatments  Labs (all labs ordered are listed, but only abnormal results are displayed) Labs Reviewed - No data to display PROCEDURES: Critical Care performed: No .Laceration Repair  Date/Time: 05/02/2024 1:35 AM  Performed by: Charleen Conn, MD Authorized by: Charleen Conn, MD   Consent:    Consent obtained:  Verbal   Consent given by:  Patient   Risks, benefits, and alternatives were discussed: yes     Risks discussed:  Infection, pain, retained foreign body, need for additional repair, poor cosmetic result, tendon damage, vascular damage, poor wound healing and nerve damage   Alternatives discussed:  No treatment, delayed treatment, observation and referral Universal protocol:    Immediately prior to procedure, a time out was called: yes     Patient identity confirmed:  Verbally with patient Anesthesia:    Anesthesia method:  Local infiltration Laceration details:    Location:  Finger   Finger location:  L thumb   Length (cm):  1   Depth (mm):  2 Pre-procedure details:    Preparation:  Patient was prepped and draped in usual  sterile fashion Exploration:    Wound exploration: entire depth of wound visualized     Contaminated: no   Treatment:    Area cleansed with:  Povidone-iodine and saline   Amount of cleaning:  Standard   Irrigation solution:  Sterile saline   Irrigation method:  Syringe Skin repair:    Repair method:  Tissue adhesive Approximation:    Approximation:  Close Repair type:    Repair type:  Simple Post-procedure details:    Dressing:  Antibiotic ointment and non-adherent dressing   Procedure completion:  Tolerated well, no immediate complications  MEDICATIONS ORDERED IN ED: Medications - No data to display IMPRESSION / MDM / ASSESSMENT AND PLAN  / ED COURSE  I reviewed the triage vital signs and the nursing notes.                             The patient is on the cardiac monitor to evaluate for evidence of arrhythmia and/or significant heart rate changes. Patient's presentation is most consistent with acute presentation with potential threat to life or bodily function. Patient had a laceration that was repaired in the ED after copious irrigation.  Please see laceration procedure note for further details.  After exploration of the wound, there was no evidence of a retained foreign body. No evidence of underlying fracture. TDAP: UTD Interventions: Defer ABX at this time given location, event time, and patient without surrounding signs of infection. Disposition: Discharge. Patient has been given strict wound return precautions and instructions to follow up with their PMD in 2 days for a wound recheck.   FINAL CLINICAL IMPRESSION(S) / ED DIAGNOSES   Final diagnoses:  Laceration of left thumb without foreign body without damage to nail, initial encounter   Rx / DC Orders   ED Discharge Orders     None      Note:  This document was prepared using Dragon voice recognition software and may include unintentional dictation errors.   Daiwik Buffalo K, MD 05/02/24 [phone]

## 2024-05-01 NOTE — ED Triage Notes (Addendum)
 Accidentally stabbed self with scissor in L thumb. Bleeding controlled at this time. Tetanus last year.

## 2024-05-01 NOTE — ED Provider Notes (Incomplete)
 [facility] Provider Note   Event Date/Time   First MD Initiated Contact with Patient 05/01/24 2258     (approximate) History  Laceration HPI Daniel [name] is a 53 y.o. male with a past medical history of type 2 diabetes, hyperlipidemia, bipolar disorder, and migraines who presents complaining of a patient currently denies any vision changes, tinnitus, difficulty speaking, facial droop, sore throat, chest pain, shortness of breath, abdominal pain, nausea/vomiting/diarrhea, dysuria, or weakness/numbness/paresthesias in any extremity stab laceration to the left thumb after he was using scissors at home and they slipped.  Patient has a subcentimeter laceration to the palmar distal aspect of the left thumb that is oozing dark blood.  Patient denies any blood thinner use or history of bleeding/coagulopathies ROS: Patient currently denies any vision changes, tinnitus, difficulty speaking, facial droop, sore throat, chest pain, shortness of breath, abdominal pain, nausea/vomiting/diarrhea, dysuria, or weakness/numbness/paresthesias in any extremity   Physical Exam  Triage Vital Signs: ED Triage Vitals  Encounter Vitals Group     BP 05/01/24 2232 123/82     Systolic BP Percentile --      Diastolic BP Percentile --      Pulse Rate 05/01/24 2229 98     Resp 05/01/24 2229 19     Temp 05/01/24 2229 98 F (36.7 C)     Temp Source 05/01/24 2229 Oral     SpO2 05/01/24 2229 100 %     Weight 05/01/24 2230 218 lb (98.9 kg)     Height 05/01/24 2230 5\' 9"  (1.753 m)     Head Circumference --      Peak Flow --      Pain Score 05/01/24 2230 10     Pain Loc --      Pain Education --      Exclude from Growth Chart --    Most recent vital signs: Vitals:   05/01/24 2229 05/01/24 2232  BP:  123/82  Pulse: 98   Resp: 19   Temp: 98 F (36.7 C) 98.4 F (36.9 C)  SpO2: 100%    General: Awake, oriented x4. CV:  Good peripheral perfusion.  Resp:  Normal effort.  Abd:  No  distention.  Other:  Middle-aged obese Caucasian male resting comfortably in no acute distress.  Subcentimeter laceration to the palmarar lateral distal aspect of the left thumb ED Results / Procedures / Treatments  Labs (all labs ordered are listed, but only abnormal results are displayed) Labs Reviewed - No data to display EKG ED ECG REPORT I, Charleen Conn, the attending physician, personally viewed and interpreted this ECG. Date: 05/01/2024 EKG Time: *** Rate: *** Rhythm: normal sinus rhythm QRS Axis: normal Intervals: normal ST/T Wave abnormalities: normal Narrative Interpretation: no evidence of acute ischemia RADIOLOGY ED MD interpretation:  *** -Agree with radiology assessment Official radiology report(s): No results found. PROCEDURES: Critical Care performed: {CriticalCareYesNo:19197::"Yes, see critical care procedure note(s)","No"} Procedures MEDICATIONS ORDERED IN ED: Medications - No data to display IMPRESSION / MDM / ASSESSMENT AND PLAN / ED COURSE  I reviewed the triage vital signs and the nursing notes.                             The patient is on the cardiac monitor to evaluate for evidence of arrhythmia and/or significant heart rate changes. Patient's presentation is most consistent with {EM COPA:27473} {Remember to include, when applicable, any/all of the following data: independent review  of imaging independent review of labs (comment specifically on pertinent positives and negatives) review of specific prior hospitalizations, PCP/specialist notes, etc. discuss meds given and prescribed document any discussion with consultants (including hospitalists) any clinical decision tools you used and why (PECARN, NEXUS, etc.) did you consider admitting the patient? document social determinants of health affecting patient's care (homelessness, inability to follow up in a timely fashion, etc) document any pre-existing conditions increasing risk on current visit  (e.g. diabetes and HTN increasing danger of high-risk chest pain/ACS) describes what meds you gave (especially parenteral) and why any other interventions?:1}   FINAL CLINICAL IMPRESSION(S) / ED DIAGNOSES   Final diagnoses:  None   Rx / DC Orders   ED Discharge Orders     None      Note:  This document was prepared using Dragon voice recognition software and may include unintentional dictation errors.

## 2024-05-02 DIAGNOSIS — S61012A Laceration without foreign body of left thumb without damage to nail, initial encounter: Secondary | ICD-10-CM | POA: Diagnosis not present

## 2024-05-19 ENCOUNTER — Encounter: Payer: Self-pay | Admitting: Urology

## 2024-05-19 ENCOUNTER — Ambulatory Visit: Admitting: Urology

## 2024-05-19 VITALS — BP 107/70 | HR 111

## 2024-05-19 DIAGNOSIS — R350 Frequency of micturition: Secondary | ICD-10-CM

## 2024-05-19 DIAGNOSIS — R399 Unspecified symptoms and signs involving the genitourinary system: Secondary | ICD-10-CM

## 2024-05-19 DIAGNOSIS — Z792 Long term (current) use of antibiotics: Secondary | ICD-10-CM

## 2024-05-19 MED ORDER — NITROFURANTOIN MONOHYD MACRO 100 MG PO CAPS: 100.0000 mg | ORAL_CAPSULE | Freq: Once | ORAL | 0 refills | Status: AC

## 2024-05-19 MED ORDER — LIDOCAINE HCL URETHRAL/MUCOSAL 2 % EX GEL
1.0000 | Freq: Once | CUTANEOUS | Status: AC
Start: 2024-05-19 — End: 2024-05-19
  Administered 2024-05-19: 1 via URETHRAL

## 2024-05-19 NOTE — Progress Notes (Signed)
 Cystoscopy Procedure Note:  Indication: Urinary frequency  Nitrofurantoin given for prophylaxis  After informed consent and discussion of the procedure and its risks, Daniel [name] was positioned and prepped in the standard fashion. Cystoscopy was performed with a flexible cystoscope. The urethra, bladder neck and entire bladder was visualized in a standard fashion. The prostate was moderate in size. The ureteral orifices were visualized in their normal location and orientation.  Bladder mucosa grossly normal throughout, no abnormalities on retroflexion  Findings: Normal bladder and prostate  Assessment and Plan: Recommended decreasing soda and diet drinks, as this is the most likely cause of his urinary frequency/urgency.  Urinalysis and PVR been benign, PSA has been normal  RTC in 1 year PA PVR, ED symptom check, ongoing PSA screening  Jay Meth, MD 05/19/2024

## 2024-05-19 NOTE — Addendum Note (Signed)
 Addended by: Karyl Paget on: 05/19/2024 01:40 PM   Modules accepted: Orders

## 2024-06-11 ENCOUNTER — Other Ambulatory Visit: Payer: Self-pay

## 2024-06-11 ENCOUNTER — Encounter: Payer: Self-pay | Admitting: Emergency Medicine

## 2024-06-11 ENCOUNTER — Emergency Department

## 2024-06-11 DIAGNOSIS — M79604 Pain in right leg: Secondary | ICD-10-CM | POA: Diagnosis present

## 2024-06-11 DIAGNOSIS — Z96651 Presence of right artificial knee joint: Secondary | ICD-10-CM | POA: Diagnosis not present

## 2024-06-11 DIAGNOSIS — M5416 Radiculopathy, lumbar region: Secondary | ICD-10-CM | POA: Diagnosis not present

## 2024-06-11 DIAGNOSIS — E119 Type 2 diabetes mellitus without complications: Secondary | ICD-10-CM | POA: Diagnosis not present

## 2024-06-11 DIAGNOSIS — W201XXA Struck by object due to collapse of building, initial encounter: Secondary | ICD-10-CM | POA: Diagnosis not present

## 2024-06-11 NOTE — ED Triage Notes (Signed)
 Pt to ED via POV with c/o R leg pain after having a brick fall on it over a week ago. Pt states seen at [facility] and had wounds cleaned and steristrips placed. Pt states since then has had increasing pain to posterior R leg. Pt states pain worse with walking, and is unable to find position of comfort.   Pt states has X-ray's done last week and was told there was no fracture. Pt denies swelling to his foot distal to the injury at this time.

## 2024-06-12 ENCOUNTER — Emergency Department: Admission: EM | Admit: 2024-06-12 | Discharge: 2024-06-12 | Disposition: A | Discharge status: Home / Self Care

## 2024-06-12 DIAGNOSIS — M5416 Radiculopathy, lumbar region: Secondary | ICD-10-CM

## 2024-06-12 DIAGNOSIS — M79604 Pain in right leg: Secondary | ICD-10-CM

## 2024-06-12 LAB — BASIC METABOLIC PANEL WITH GFR
Anion gap: 10 (ref 5–15)
BUN: 16 mg/dL (ref 6–20)
CO2: 27 mmol/L (ref 22–32)
Calcium: 9.2 mg/dL (ref 8.9–10.3)
Chloride: 102 mmol/L (ref 98–111)
Creatinine, Ser: 0.98 mg/dL (ref 0.61–1.24)
GFR, Estimated: >60 mL/min (ref >60)
Glucose, Bld: 227 mg/dL — ABNORMAL HIGH (ref 70–99)
Potassium: 4.3 mmol/L (ref 3.5–5.1)
Sodium: 139 mmol/L (ref 135–145)

## 2024-06-12 LAB — CBC
HCT: 40 % (ref 39.0–52.0)
Hemoglobin: 12.6 g/dL — ABNORMAL LOW (ref 13.0–17.0)
MCH: 25.9 pg — ABNORMAL LOW (ref 26.0–34.0)
MCHC: 31.5 g/dL (ref 30.0–36.0)
MCV: 82.1 fL (ref 80.0–100.0)
Platelets: 230 K/uL (ref 150–400)
RBC: 4.87 MIL/uL (ref 4.22–5.81)
RDW: 15.8 % — ABNORMAL HIGH (ref 11.5–15.5)
WBC: 7.2 K/uL (ref 4.0–10.5)
nRBC: 0 % (ref 0.0–0.2)

## 2024-06-12 LAB — PROTIME-INR
INR: 1 (ref 0.8–1.2)
Prothrombin Time: 13.5 s (ref 11.4–15.2)

## 2024-06-12 MED ORDER — LIDOCAINE 5 % EX PTCH: 1.0000 | MEDICATED_PATCH | CUTANEOUS | 0 refills | Status: AC

## 2024-06-12 MED ORDER — IBUPROFEN 800 MG PO TABS: 800.0000 mg | ORAL_TABLET | Freq: Three times a day (TID) | ORAL | 0 refills | Status: DC | PRN

## 2024-06-12 MED ORDER — IBUPROFEN 600 MG PO TABS
600.0000 mg | ORAL_TABLET | Freq: Once | ORAL | Status: AC
  Administered 2024-06-12: 600 mg via ORAL
  Filled 2024-06-12: qty 1

## 2024-06-12 MED ORDER — LIDOCAINE 5 % EX PTCH
1.0000 | MEDICATED_PATCH | Freq: Once | CUTANEOUS | Status: DC
  Administered 2024-06-12: 1 via TRANSDERMAL
  Filled 2024-06-12: qty 1

## 2024-06-12 MED ORDER — ACETAMINOPHEN 500 MG PO TABS
1000.0000 mg | ORAL_TABLET | Freq: Once | ORAL | Status: AC
  Administered 2024-06-12: 1000 mg via ORAL
  Filled 2024-06-12: qty 2

## 2024-06-12 MED ORDER — ACETAMINOPHEN 500 MG PO TABS: 1000.0000 mg | ORAL_TABLET | Freq: Four times a day (QID) | ORAL | 2 refills | Status: DC | PRN

## 2024-06-12 MED ORDER — GABAPENTIN 300 MG PO CAPS
400.0000 mg | ORAL_CAPSULE | Freq: Once | ORAL | Status: AC
  Administered 2024-06-12: 400 mg via ORAL
  Filled 2024-06-12: qty 1

## 2024-06-12 NOTE — ED Notes (Signed)
 Mian MD at bedside

## 2024-06-12 NOTE — ED Provider Notes (Signed)
 [facility] Provider Note    Event Date/Time   First MD Initiated Contact with Patient 06/12/24 0103     (approximate)   History   Leg Pain  Pt to ED via POV with c/o R leg pain after having a brick fall on it over a week ago. Pt states seen at [facility] and had wounds cleaned and steristrips placed. Pt states since then has had increasing pain to posterior R leg. Pt states pain worse with walking, and is unable to find position of comfort.   Pt states has X-ray's done last week and was told there was no fracture. Pt denies swelling to his foot distal to the injury at this time.    HPI Daniel [name] is a 53 y.o. male PMH diabetes, prior right knee replacement, bipolar 2 disorder, presents for evaluation of right leg pain - Patient notes he is having pain in his right posterior hip radiating down his posterior leg as well as some discomfort in his right inguinal region - Developed today.  No preceding trauma.  No chest pain or shortness of breath. - Did recently have a nerve stimulator implanted in his lower back, per chart review this was meant to treat lumbar radiculopathy - Did have pain on his right shin after being hit by a brick at work.  Is not actually having pain at wound site.  X-rays negative on that evaluation per chart review. - Had been taking oxycodone -acetaminophen  for pain though not today, is also on gabapentin  300 mg 3 times daily.  X-ray report from 06/07/2024: IMPRESSION:  1. No acute fracture.  2. No malalignment.  3. Partially visualized total knee arthroplasty without evidence of  hardware complication.  4. Patellar and calcaneal enthesopathy.  5. Mild degenerative changes of the midfoot.          Physical Exam   Triage Vital Signs: ED Triage Vitals [06/11/24 2323]  Encounter Vitals Group     BP 137/75     Girls Systolic BP Percentile      Girls Diastolic BP Percentile      Boys Systolic BP Percentile      Boys Diastolic  BP Percentile      Pulse Rate 97     Resp 19     Temp 99.2 F (37.3 C)     Temp Source Oral     SpO2 100 %     Weight 218 lb 0.6 oz (98.9 kg)     Height 5' 9 (1.753 m)     Head Circumference      Peak Flow      Pain Score 10     Pain Loc      Pain Education      Exclude from Growth Chart     Most recent vital signs: Vitals:   06/11/24 2323 06/12/24 0202  BP: 137/75 132/79  Pulse: 97 81  Resp: 19 18  Temp: 99.2 F (37.3 C)   SpO2: 100% 97%     General: Awake, no distress.  CV:  Good peripheral perfusion. RRR, RP 2+ Resp:  Normal effort. CTAB Back:  Well-healing low back surgical scars RLE:  Some tenderness to palpation over right posterior hip.  Mild tenderness in right inguinal region.  No overlying skin changes.  Positive straight leg raise.  Pedal pulse 2+.  Full range of motion of all joints.  Well-healing abrasion/wound to anterior shin.  No significant surrounding erythema, no wound drainage.  ED Results / Procedures / Treatments   Labs (all labs ordered are listed, but only abnormal results are displayed) Labs Reviewed  CBC - Abnormal; Notable for the following components:      Result Value   Hemoglobin 12.6 (*)    MCH 25.9 (*)    RDW 15.8 (*)    All other components within normal limits  BASIC METABOLIC PANEL WITH GFR - Abnormal; Notable for the following components:   Glucose, Bld 227 (*)    All other components within normal limits  PROTIME-INR     EKG  N/a   RADIOLOGY N/a    PROCEDURES:  Critical Care performed: No  Procedures   MEDICATIONS ORDERED IN ED: Medications  lidocaine  (LIDODERM ) 5 % 1 patch (1 patch Transdermal Patch Applied 06/12/24 0203)  acetaminophen  (TYLENOL ) tablet 1,000 mg (1,000 mg Oral Given 06/12/24 0202)  ibuprofen  (ADVIL ) tablet 600 mg (600 mg Oral Given 06/12/24 0202)  gabapentin  (NEURONTIN ) capsule 400 mg (400 mg Oral Given 06/12/24 0202)     IMPRESSION / MDM / ASSESSMENT AND PLAN / ED COURSE  I reviewed the  triage vital signs and the nursing notes.                              DDX/MDM/AP: Differential diagnosis includes, but is not limited to, likely lumbar radiculopathy, considered DVT, consider muscle strain.  No back pain or other red flag symptoms.  Plan: - Labs and DVT ultrasound ordered from triage--unremarkable -Tylenol , gabapentin , Motrin , Lidoderm  patch   Patient's presentation is most consistent with acute complicated illness / injury requiring diagnostic workup.   ED course below.  No DVT on ultrasound.  Presentation overall most consistent with lumbar radiculopathy.  Will increase gabapentin  to 600 mg TID (currently BID), recommend patient avoid narcotics for this but can use 1000 mg Tylenol  4 times daily (counseled not to take these doses if he does take his oxycodone -acetaminophen  tablets), Motrin , Lidoderm  patches.  Has follow-up with surgery team that placed nerve stimulator this Wednesday.  ED return precautions in place.  Patient agrees with plan.  Patient has multiple allergies listed in his chart but confirms that he tolerates Tylenol  and Motrin  and gabapentin  without difficulty.  Clinical Course as of 06/12/24 0210  [place] Jun 12, 2024  0149 CBC with no leukocytosis, anemia within baseline range  BMP unremarkable [MM]  0149 DVT ultrasound reviewed and radiology report reviewed, unremarkable --no DVT [MM]    Clinical Course User Index [MM] Clarine Ozell LABOR, MD     FINAL CLINICAL IMPRESSION(S) / ED DIAGNOSES   Final diagnoses:  Right leg pain  Lumbar radiculopathy     Rx / DC Orders   ED Discharge Orders          Ordered    acetaminophen  (TYLENOL ) 500 MG tablet  Every 6 hours PRN        06/12/24 0210    ibuprofen  (ADVIL ) 800 MG tablet  Every 8 hours PRN        06/12/24 0210    lidocaine  (LIDODERM ) 5 %  Every 24 hours        06/12/24 0210             Note:  This document was prepared using Dragon voice recognition software and may include  unintentional dictation errors.   Clarine Ozell LABOR, MD 06/12/24 [phone]

## 2024-06-12 NOTE — Discharge Instructions (Addendum)
 Your evaluation in the emergency department is overall reassuring.  I suspect you have a pinched nerve in your low back that is causing this pain, I recommend you follow-up with your nerve stimulator surgeon team this Wednesday as already planned as well as your primary care provider.  You can use Tylenol , Motrin , and Lidoderm  patches as needed for any ongoing discomfort.  You could also start taking your gabapentin  3 times daily (instead of 2) until you are evaluated by your outpatient providers.  Return to the emergency department with any new or worsening symptoms.

## 2024-07-05 DIAGNOSIS — R399 Unspecified symptoms and signs involving the genitourinary system: Secondary | ICD-10-CM

## 2024-07-05 DIAGNOSIS — E291 Testicular hypofunction: Secondary | ICD-10-CM

## 2024-07-05 MED ORDER — TADALAFIL 20 MG PO TABS: 20.0000 mg | ORAL_TABLET | Freq: Every day | ORAL | 11 refills | Status: DC

## 2024-07-05 MED ORDER — TADALAFIL 20 MG PO TABS: 20.0000 mg | ORAL_TABLET | Freq: Every day | ORAL | 3 refills | Status: DC

## 2024-07-05 NOTE — Telephone Encounter (Signed)
 RX sent

## 2024-07-05 NOTE — Addendum Note (Signed)
 Addended by: ELOUISE SANTA BROCKS on: 07/05/2024 03:57 PM   Modules accepted: Orders

## 2024-07-22 ENCOUNTER — Other Ambulatory Visit: Payer: Self-pay

## 2024-07-28 ENCOUNTER — Ambulatory Visit: Payer: Self-pay | Admitting: Physician Assistant

## 2024-07-28 VITALS — BP 110/71 | HR 87 | Ht 69.0 in | Wt 218.0 lb

## 2024-07-28 DIAGNOSIS — N402 Nodular prostate without lower urinary tract symptoms: Secondary | ICD-10-CM | POA: Diagnosis not present

## 2024-07-28 DIAGNOSIS — R399 Unspecified symptoms and signs involving the genitourinary system: Secondary | ICD-10-CM | POA: Diagnosis not present

## 2024-07-28 DIAGNOSIS — N3941 Urge incontinence: Secondary | ICD-10-CM

## 2024-07-28 LAB — BLADDER SCAN AMB NON-IMAGING

## 2024-07-28 MED ORDER — TADALAFIL 20 MG PO TABS: 20.0000 mg | ORAL_TABLET | Freq: Every day | ORAL | 3 refills | Status: AC

## 2024-07-28 NOTE — Progress Notes (Signed)
 07/28/2024 1:55 PM   Alm DELENA Beat 11-02-1971 [id number]  CC: Chief Complaint  Patient presents with   Follow-up   HPI: Daniel [name] is a 53 y.o. male with PMH diabetes on Jardiance , ED and LUTS on Flomax  and tadalafil  5 mg daily, and hypogonadism previously on TRT who presents today for annual follow-up.   Today he reports stopped Flomax  because he did not think it was helping.  He is on tadalafil  20 mg daily per Dr. Francisca and his urinary symptoms are very well-controlled on this.  He is pleased with his regimen and has no acute concerns today.  PVR 1mL.  PSA history as follows.  He does have a family history of nonlethal prostate cancer in his father. 02/07/2021: 1.68 07/21/2022: 1.5 12/07/2023: 3.31  PMH: Past Medical History:  Diagnosis Date   Asthma    Bipolar 1 disorder (HCC)    Chronic back pain    DDD (degenerative disc disease), cervical    Depression    Diabetes mellitus without complication (HCC)    ED (erectile dysfunction)    Elevated lipids    GERD (gastroesophageal reflux disease)    Headache    Insomnia    Migraine    1-2x/month since starting emgality    Seasonal allergies    Sleep apnea    Wears hearing aid in both ears    Wheezing     Surgical History: Past Surgical History:  Procedure Laterality Date   CHOLECYSTECTOMY     COLONOSCOPY W/ BIOPSIES     ESOPHAGOGASTRODUODENOSCOPY (EGD) WITH PROPOFOL  N/A 03/17/2023   Procedure: ESOPHAGOGASTRODUODENOSCOPY (EGD) WITH PROPOFOL ;  Surgeon: Maryruth Ole DASEN, MD;  Location: [facility];  Service: Endoscopy;  Laterality: N/A;   FOOT ARTHRODESIS Left 05/13/2023   Procedure: ARTHRODESIS FOOT;  Surgeon: Ashley Soulier, DPM;  Location: [facility];  Service: Podiatry;  Laterality: Left;  Diabetic   FRACTURE SURGERY     tail bone   knee arthroscpy Right    PERIPHERAL NERVE STIMULATOR     POSTERIOR CERVICAL LAMINECTOMY Right 11/19/2022   Procedure: RIGHT C5-6 FORAMINOTOMY;  Surgeon:  Clois Fret, MD;  Location: [facility];  Service: Neurosurgery;  Laterality: Right;   SHOULDER ARTHROSCOPY WITH DISTAL CLAVICLE RESECTION Right 10/13/2017   Procedure: SHOULDER ARTHROSCOPY WITH DISTAL CLAVICLE RESECTION;  Surgeon: Edie Norleen PARAS, MD;  Location: [facility];  Service: Orthopedics;  Laterality: Right;   SHOULDER ARTHROSCOPY WITH OPEN ROTATOR CUFF REPAIR Right 10/13/2017   Procedure: SHOULDER ARTHROSCOPY WITH OPEN ROTATOR CUFF REPAIR/;  Surgeon: Edie Norleen PARAS, MD;  Location: [facility];  Service: Orthopedics;  Laterality: Right;   TOTAL KNEE ARTHROPLASTY Right 12/06/2019   Procedure: TOTAL KNEE ARTHROPLASTY;  Surgeon: Kathlynn Sharper, MD;  Location: [facility];  Service: Orthopedics;  Laterality: Right;   TOTAL KNEE ARTHROPLASTY Right 05/14/2021   Procedure: REVISION TOTAL KNEE ARTHROPLASTY - [place] Amber to Assist;  Surgeon: Kathlynn Sharper, MD;  Location: [facility];  Service: Orthopedics;  Laterality: Right;    Home Medications:  Allergies as of 07/28/2024       Reactions   Doxycycline Nausea And Vomiting   Benzyl Hydroxybenzoate Other (See Comments)   Confirmed by allergy testing Able to wear latex gloves   Dha-epa-vitamin E Other (See Comments)   By allergy testing   Ketorolac  Itching   Other reaction(s): Unknown   Omega 3-6-9 Other (See Comments)   By allergy testing   Omega-3    By allergy testing   Other Other (See Comments)  Confirmed by allergy testing Mercaptobenzothiazole Able to wear latex gloves   Parabens Other (See Comments)   Confirmed by allergy testing Able to wear latex gloves   Tramadol Itching, Other (See Comments)   Tolerates extended release Other reaction(s):    Penicillins Hives, Rash, Other (See Comments)   Has patient had a PCN reaction causing immediate rash, facial/tongue/throat swelling, SOB or lightheadedness with hypotension: Yes Has patient had a PCN reaction causing severe rash involving mucus membranes or skin necrosis: No Has patient  had a PCN reaction that required hospitalization No Has patient had a PCN reaction occurring within the last 10 years: No If all of the above answers are NO, then may proceed with Cephalosporin use. Other reaction(s):  Can take keflex        Medication List        Accurate as of July 28, 2024  1:55 PM. If you have any questions, ask your nurse or doctor.          acetaminophen  500 MG tablet Commonly known as: TYLENOL  Take 2 tablets (1,000 mg total) by mouth every 6 (six) hours as needed.   albuterol  (2.5 MG/3ML) 0.083% nebulizer solution Commonly known as: PROVENTIL  Take 2.5 mg by nebulization every 4 (four) hours as needed for wheezing.   albuterol  108 (90 Base) MCG/ACT inhaler Commonly known as: VENTOLIN  HFA Inhale 2 puffs into the lungs every 4 (four) hours as needed for wheezing.   atorvastatin  40 MG tablet Commonly known as: LIPITOR Take 40 mg by mouth at bedtime.   budesonide 1 MG/2ML nebulizer solution Commonly known as: PULMICORT INHALE 1 VIAL VIA NEBULIZER ONCE DAILY   celecoxib  200 MG capsule Commonly known as: CELEBREX  Take 1 capsule by mouth 2 (two) times daily.   Descovy 200-25 MG tablet Generic drug: emtricitabine-tenofovir AF Take 1 tablet by mouth daily.   divalproex  250 MG 24 hr tablet Commonly known as: DEPAKOTE  ER Take 250 mg by mouth in the morning and at bedtime.   DULoxetine  20 MG capsule Commonly known as: CYMBALTA  Take 20 mg by mouth at bedtime.   Emgality  120 MG/ML Soaj Generic drug: Galcanezumab -gnlm Inject 120 mg into the skin every 30 (thirty) days.   famotidine  20 MG tablet Commonly known as: PEPCID  Take 20 mg by mouth 2 (two) times daily.   FLUoxetine  20 MG capsule Commonly known as: PROZAC  Take 20 mg by mouth daily.   gabapentin  300 MG capsule Commonly known as: NEURONTIN  Take 600 mg by mouth 2 (two) times daily.   glipiZIDE 5 MG 24 hr tablet Commonly known as: GLUCOTROL XL Take 5 mg by mouth daily.    ibuprofen  800 MG tablet Commonly known as: ADVIL  Take 1 tablet (800 mg total) by mouth every 8 (eight) hours as needed.   Jardiance  10 MG Tabs tablet Generic drug: empagliflozin  Take 10 mg by mouth daily.   melatonin 3 MG Tabs tablet Take 3 mg by mouth at bedtime.   montelukast  10 MG tablet Commonly known as: SINGULAIR  Take 10 mg by mouth at bedtime.   multivitamin with minerals Tabs tablet Take 1 tablet by mouth daily.   nortriptyline  50 MG capsule Commonly known as: PAMELOR  at bedtime. Take 40mg  nightly for one week, then increase to 50mg  nightly   OneTouch Delica Plus Lancet33G Misc See admin instructions.   pantoprazole  40 MG tablet Commonly known as: PROTONIX  Take 40 mg by mouth 2 (two) times daily.   propranolol  10 MG tablet Commonly known as: INDERAL  Take 10 mg  by mouth 2 (two) times daily.   pseudoephedrine -guaifenesin  60-600 MG 12 hr tablet Commonly known as: MUCINEX  D Take 1 tablet by mouth every 12 (twelve) hours as needed.   rizatriptan 10 MG tablet Commonly known as: MAXALT May take a second dose after 2 hours if needed.   sitaGLIPtin 100 MG tablet Commonly known as: JANUVIA Take 100 mg by mouth daily.   tadalafil  20 MG tablet Commonly known as: CIALIS  Take 1 tablet (20 mg total) by mouth daily.   tamsulosin  0.4 MG Caps capsule Commonly known as: FLOMAX  Take 1 capsule (0.4 mg total) by mouth daily.   traZODone  150 MG tablet Commonly known as: DESYREL  Take 150 mg by mouth at bedtime.   zonisamide 100 MG capsule Commonly known as: ZONEGRAN Take 100 mg by mouth daily.        Allergies:  Allergies  Allergen Reactions   Doxycycline Nausea And Vomiting   Benzyl Hydroxybenzoate Other (See Comments)    Confirmed by allergy testing Able to wear latex gloves   Dha-Epa-Vitamin E Other (See Comments)    By allergy testing   Ketorolac  Itching    Other reaction(s): Unknown   Omega 3-6-9 Other (See Comments)    By allergy testing   Omega-3      By allergy testing    Other Other (See Comments)    Confirmed by allergy testing Mercaptobenzothiazole Able to wear latex gloves   Parabens Other (See Comments)    Confirmed by allergy testing Able to wear latex gloves    Tramadol Itching and Other (See Comments)    Tolerates extended release Other reaction(s):    Penicillins Hives, Rash and Other (See Comments)    Has patient had a PCN reaction causing immediate rash, facial/tongue/throat swelling, SOB or lightheadedness with hypotension: Yes Has patient had a PCN reaction causing severe rash involving mucus membranes or skin necrosis: No Has patient had a PCN reaction that required hospitalization No Has patient had a PCN reaction occurring within the last 10 years: No If all of the above answers are NO, then may proceed with Cephalosporin use. Other reaction(s):  Can take keflex    Family History: Family History  Problem Relation Age of Onset   Prostate cancer Father    Stroke Father    Diabetes Mother    Hypertension Mother     Social History:   reports that he quit smoking about 9 years ago. His smoking use included cigarettes. He has never used smokeless tobacco. He reports that he does not drink alcohol and does not use drugs.  Physical Exam: BP 110/71 (BP Location: Left Arm, Patient Position: Sitting, Cuff Size: Normal)   Pulse 87   Ht 5' 9 (1.753 m)   Wt 218 lb (98.9 kg)   SpO2 98%   BMI 32.19 kg/m   Constitutional:  Alert and oriented, no acute distress, nontoxic appearing HEENT: [place], AT Cardiovascular: No clubbing, cyanosis, or edema Respiratory: Normal respiratory effort, no increased work of breathing GU: Normal sphincter tone.  Palpable nodule at the left lateral apex. Skin: No rashes, bruises or suspicious lesions Neurologic: Grossly intact, no focal deficits, moving all 4 extremities Psychiatric: Normal mood and affect  Laboratory Data: Results for orders placed or performed in visit on  07/28/24  Bladder Scan (Post Void Residual) in office   Collection Time: 07/28/24  1:55 PM  Result Value Ref Range   Scan Result 1ml    Assessment & Plan:   1. Lower urinary tract symptoms (  LUTS) (Primary) Symptoms well-controlled on Cialis  20 mg daily and he is emptying appropriately.  Will continue this. - Bladder Scan (Post Void Residual) in office - tadalafil  (CIALIS ) 20 MG tablet; Take 1 tablet (20 mg total) by mouth daily.  Dispense: 90 tablet; Refill: 3  2. Prostate nodule Palpable prostate nodule, family history of prostate cancer, and concerning PSA velocity earlier this year.  Will proceed directly to prostate biopsy.  He is in agreement.  We discussed the risk of postbiopsy sepsis and warning signs including fevers over 101 F.  We discussed normal postbiopsy findings including hematuria, hematochezia, and hematospermia. - PSA Total (Reflex To Free)   Return in about 4 weeks (around 08/25/2024) for Prostate biopsy with Dr. Francisca.  Lucie Hones, PA-C  [facility] [place] [address] [place], [place] [address] [phone]

## 2024-07-28 NOTE — Patient Instructions (Signed)
 Prostate Biopsy Instructions  Stop all aspirin or blood thinners (aspirin, plavix, coumadin, warfarin, motrin, ibuprofen, advil, aleve, naproxen, naprosyn) for 7 days prior to the procedure.  If you have any questions about stopping these medications, please contact your primary care physician or cardiologist.  Having a light meal prior to the procedure is recommended.  If you are diabetic or have low blood sugar please bring a small snack or glucose tablet.  A Fleets enema is needed to be purchased over the counter at a local pharmacy and used 2 hours before you scheduled appointment.  This can be purchased over the counter at any pharmacy.  Antibiotics will be administered in the clinic at the time of the procedure unless otherwise specified.    Please bring someone with you to the procedure to drive you home.  A follow up appointment has been scheduled for you to receive the results of the biopsy.  If you have any questions or concerns, please feel free to call the office at [phone] or send a Mychart message.    Thank you, Staff at [facility]

## 2024-07-29 ENCOUNTER — Other Ambulatory Visit: Payer: Self-pay | Admitting: Student

## 2024-07-29 ENCOUNTER — Ambulatory Visit: Payer: Self-pay | Admitting: Physician Assistant

## 2024-07-29 DIAGNOSIS — M5412 Radiculopathy, cervical region: Secondary | ICD-10-CM

## 2024-07-29 DIAGNOSIS — M79602 Pain in left arm: Secondary | ICD-10-CM

## 2024-07-29 DIAGNOSIS — R200 Anesthesia of skin: Secondary | ICD-10-CM

## 2024-07-29 LAB — PSA TOTAL (REFLEX TO FREE): Prostate Specific Ag, Serum: 3.9 ng/mL (ref 0.0–4.0)

## 2024-07-30 ENCOUNTER — Encounter: Payer: Self-pay | Admitting: Physician Assistant

## 2024-08-02 ENCOUNTER — Ambulatory Visit
Admission: RE | Admit: 2024-08-02 | Discharge: 2024-08-02 | Disposition: A | Discharge status: Home / Self Care | Source: Ambulatory Visit | Attending: Student | Admitting: Student

## 2024-08-02 DIAGNOSIS — R200 Anesthesia of skin: Secondary | ICD-10-CM | POA: Diagnosis present

## 2024-08-02 DIAGNOSIS — M5412 Radiculopathy, cervical region: Secondary | ICD-10-CM | POA: Diagnosis present

## 2024-08-02 DIAGNOSIS — M79602 Pain in left arm: Secondary | ICD-10-CM | POA: Insufficient documentation

## 2024-09-06 NOTE — Progress Notes (Unsigned)
 Referring Physician:  Dodson Delon FERNS, MD [address] [place],  [place] [address]  Primary Physician:  [facility]  History of Present Illness: 09/06/2024 [name] is here today with a chief complaint of ***  Neck pain that radiates down the left arm with complaints of numbness and weakness.  EMG done 06/28/24  [facility] Nervo  Duration: *** Location: *** Quality: *** Severity: ***  Precipitating: aggravated by *** Modifying factors: made better by *** Weakness: none Timing: *** Bowel/Bladder Dysfunction: none  Conservative measures:  Physical therapy: *** has participated in PT at [facility] Multimodal medical therapy including regular antiinflammatories: ***Tylenol , Celebrex , Cymbalta , Gabapentin , Ibuprofen   Injections: 08/13/2022 C3-4 ESI  Past Surgery: ***06/01/2024 [facility] (Nervo) Surgeon: Toribio Badder, MD 11/19/2022 Right C5-6 Foraminotomy Surgeon: Reeves Daisy, MD  Alm DELENA Beat has ***no symptoms of cervical myelopathy.  The symptoms are causing a significant impact on the patient's life.   I have utilized the care everywhere function in epic to review the outside records available from external health systems.   Review of Systems:  A 10 point review of systems is negative, except for the pertinent positives and negatives detailed in the HPI.  Past Medical History: Past Medical History:  Diagnosis Date   Asthma    Bipolar 1 disorder (HCC)    Chronic back pain    DDD (degenerative disc disease), cervical    Depression    Diabetes mellitus without complication (HCC)    ED (erectile dysfunction)    Elevated lipids    GERD (gastroesophageal reflux disease)    Headache    Insomnia    Migraine    1-2x/month since starting emgality    Seasonal allergies    Sleep apnea    Wears hearing aid in both ears    Wheezing     Past Surgical History: Past Surgical History:  Procedure Laterality Date   CHOLECYSTECTOMY      COLONOSCOPY W/ BIOPSIES     ESOPHAGOGASTRODUODENOSCOPY (EGD) WITH PROPOFOL  N/A 03/17/2023   Procedure: ESOPHAGOGASTRODUODENOSCOPY (EGD) WITH PROPOFOL ;  Surgeon: Maryruth Ole DASEN, MD;  Location: [facility];  Service: Endoscopy;  Laterality: N/A;   FOOT ARTHRODESIS Left 05/13/2023   Procedure: ARTHRODESIS FOOT;  Surgeon: Ashley Soulier, DPM;  Location: [facility];  Service: Podiatry;  Laterality: Left;  Diabetic   FRACTURE SURGERY     tail bone   knee arthroscpy Right    PERIPHERAL NERVE STIMULATOR     POSTERIOR CERVICAL LAMINECTOMY Right 11/19/2022   Procedure: RIGHT C5-6 FORAMINOTOMY;  Surgeon: Daisy Reeves, MD;  Location: [facility];  Service: Neurosurgery;  Laterality: Right;   SHOULDER ARTHROSCOPY WITH DISTAL CLAVICLE RESECTION Right 10/13/2017   Procedure: SHOULDER ARTHROSCOPY WITH DISTAL CLAVICLE RESECTION;  Surgeon: Edie Norleen PARAS, MD;  Location: [facility];  Service: Orthopedics;  Laterality: Right;   SHOULDER ARTHROSCOPY WITH OPEN ROTATOR CUFF REPAIR Right 10/13/2017   Procedure: SHOULDER ARTHROSCOPY WITH OPEN ROTATOR CUFF REPAIR/;  Surgeon: Edie Norleen PARAS, MD;  Location: [facility];  Service: Orthopedics;  Laterality: Right;   TOTAL KNEE ARTHROPLASTY Right 12/06/2019   Procedure: TOTAL KNEE ARTHROPLASTY;  Surgeon: Kathlynn Sharper, MD;  Location: [facility];  Service: Orthopedics;  Laterality: Right;   TOTAL KNEE ARTHROPLASTY Right 05/14/2021   Procedure: REVISION TOTAL KNEE ARTHROPLASTY - [place] Amber to Assist;  Surgeon: Kathlynn Sharper, MD;  Location: [facility];  Service: Orthopedics;  Laterality: Right;    Allergies: Allergies as of 09/08/2024 - Review Complete 06/11/2024  Allergen Reaction Noted  Doxycycline Nausea And Vomiting 03/03/2016   Benzyl hydroxybenzoate Other (See Comments) 05/23/2014   Dha-epa-vitamin e Other (See Comments) 05/23/2014   Ketorolac  Itching 08/22/2018   Omega 3-6-9 Other (See Comments) 05/23/2014   Omega-3  05/23/2014   Other Other (See  Comments) 05/23/2014   Parabens Other (See Comments) 05/23/2014   Tramadol Itching and Other (See Comments) 03/14/2015   Penicillins Hives, Rash, and Other (See Comments) 06/25/71    Medications:  Current Outpatient Medications:    acetaminophen  (TYLENOL ) 500 MG tablet, Take 2 tablets (1,000 mg total) by mouth every 6 (six) hours as needed., Disp: 100 tablet, Rfl: 2   albuterol  (PROVENTIL ) (2.5 MG/3ML) 0.083% nebulizer solution, Take 2.5 mg by nebulization every 4 (four) hours as needed for wheezing. , Disp: , Rfl:    albuterol  (VENTOLIN  HFA) 108 (90 Base) MCG/ACT inhaler, Inhale 2 puffs into the lungs every 4 (four) hours as needed for wheezing., Disp: , Rfl:    atorvastatin  (LIPITOR) 40 MG tablet, Take 40 mg by mouth at bedtime., Disp: , Rfl:    budesonide (PULMICORT) 1 MG/2ML nebulizer solution, INHALE 1 VIAL VIA NEBULIZER ONCE DAILY, Disp: , Rfl:    celecoxib  (CELEBREX ) 200 MG capsule, Take 1 capsule by mouth 2 (two) times daily., Disp: , Rfl:    divalproex  (DEPAKOTE  ER) 250 MG 24 hr tablet, Take 250 mg by mouth in the morning and at bedtime., Disp: , Rfl:    DULoxetine  (CYMBALTA ) 20 MG capsule, Take 20 mg by mouth at bedtime., Disp: , Rfl:    empagliflozin  (JARDIANCE ) 10 MG TABS tablet, Take 10 mg by mouth daily., Disp: , Rfl:    emtricitabine-tenofovir AF (DESCOVY) 200-25 MG tablet, Take 1 tablet by mouth daily., Disp: , Rfl:    famotidine  (PEPCID ) 20 MG tablet, Take 20 mg by mouth 2 (two) times daily., Disp: , Rfl:    FLUoxetine  (PROZAC ) 20 MG capsule, Take 20 mg by mouth daily., Disp: , Rfl:    gabapentin  (NEURONTIN ) 300 MG capsule, Take 600 mg by mouth 2 (two) times daily., Disp: , Rfl:    Galcanezumab -gnlm (EMGALITY ) 120 MG/ML SOAJ, Inject 120 mg into the skin every 30 (thirty) days., Disp: , Rfl:    glipiZIDE (GLUCOTROL XL) 5 MG 24 hr tablet, Take 5 mg by mouth daily., Disp: , Rfl:    ibuprofen  (ADVIL ) 800 MG tablet, Take 1 tablet (800 mg total) by mouth every 8 (eight) hours as  needed., Disp: 30 tablet, Rfl: 0   Lancets (ONETOUCH DELICA PLUS LANCET33G) MISC, See admin instructions., Disp: , Rfl:    Melatonin 3 MG TABS, Take 3 mg by mouth at bedtime. , Disp: , Rfl:    montelukast  (SINGULAIR ) 10 MG tablet, Take 10 mg by mouth at bedtime., Disp: , Rfl:    Multiple Vitamin (MULTIVITAMIN WITH MINERALS) TABS tablet, Take 1 tablet by mouth daily., Disp: , Rfl:    nortriptyline  (PAMELOR ) 50 MG capsule, at bedtime. Take 40mg  nightly for one week, then increase to 50mg  nightly, Disp: , Rfl:    pantoprazole  (PROTONIX ) 40 MG tablet, Take 40 mg by mouth 2 (two) times daily., Disp: , Rfl:    propranolol  (INDERAL ) 10 MG tablet, Take 10 mg by mouth 2 (two) times daily., Disp: , Rfl:    pseudoephedrine -guaifenesin  (MUCINEX  D) 60-600 MG 12 hr tablet, Take 1 tablet by mouth every 12 (twelve) hours as needed., Disp: , Rfl:    rizatriptan (MAXALT) 10 MG tablet, May take a second dose after 2 hours if needed.,  Disp: , Rfl:    sitaGLIPtin (JANUVIA) 100 MG tablet, Take 100 mg by mouth daily., Disp: , Rfl:    tadalafil  (CIALIS ) 20 MG tablet, Take 1 tablet (20 mg total) by mouth daily., Disp: 90 tablet, Rfl: 3   traZODone  (DESYREL ) 150 MG tablet, Take 150 mg by mouth at bedtime., Disp: , Rfl:    zonisamide (ZONEGRAN) 100 MG capsule, Take 100 mg by mouth daily., Disp: , Rfl:   Social History: Social History   Tobacco Use   Smoking status: Former    Current packs/day: 0.00    Types: Cigarettes    Quit date: 11/28/2014    Years since quitting: 9.7   Smokeless tobacco: Never  Vaping Use   Vaping status: Never Used  Substance Use Topics   Alcohol use: No    Alcohol/week: 0.0 standard drinks of alcohol   Drug use: No    Family Medical History: Family History  Problem Relation Age of Onset   Prostate cancer Father    Stroke Father    Diabetes Mother    Hypertension Mother     Physical Examination: There were no vitals filed for this visit.  General: Patient is in no apparent  distress. Attention to examination is appropriate.  Neck:   Supple.  Full range of motion.  Respiratory: Patient is breathing without any difficulty.   NEUROLOGICAL:     Awake, alert, oriented to person, place, and time.  Speech is clear and fluent.   Cranial Nerves: Pupils equal round and reactive to light.  Facial tone is symmetric.  Facial sensation is symmetric. Shoulder shrug is symmetric. Tongue protrusion is midline.  There is no pronator drift.  Strength: Side Biceps Triceps Deltoid Interossei Grip Wrist Ext. Wrist Flex.  R 5 5 5 5 5 5 5   L 5 5 5 5 5 5 5    Side Iliopsoas Quads Hamstring PF DF EHL  R 5 5 5 5 5 5   L 5 5 5 5 5 5    Reflexes are ***2+ and symmetric at the biceps, triceps, brachioradialis, patella and achilles.   Hoffman's is absent.   Bilateral upper and lower extremity sensation is intact to light touch.    No evidence of dysmetria noted.  Gait is normal.     Medical Decision Making  Imaging: ***  I have personally reviewed the images and agree with the above interpretation.  Assessment and Plan: Mr. Amon is a pleasant 53 y.o. male with ***      Thank you for involving me in the care of this patient.      Alexandera Kuntzman K. Clois MD, [facility]

## 2024-09-08 ENCOUNTER — Ambulatory Visit (INDEPENDENT_AMBULATORY_CARE_PROVIDER_SITE_OTHER): Admitting: Neurosurgery

## 2024-09-08 VITALS — BP 114/90 | Ht 69.0 in | Wt 191.4 lb

## 2024-09-08 DIAGNOSIS — M4802 Spinal stenosis, cervical region: Secondary | ICD-10-CM

## 2024-09-08 DIAGNOSIS — M5412 Radiculopathy, cervical region: Secondary | ICD-10-CM

## 2024-09-08 DIAGNOSIS — M542 Cervicalgia: Secondary | ICD-10-CM

## 2024-09-15 ENCOUNTER — Encounter: Payer: Self-pay | Admitting: Neurosurgery

## 2024-09-21 ENCOUNTER — Other Ambulatory Visit: Payer: Self-pay | Admitting: Urology

## 2024-09-21 ENCOUNTER — Ambulatory Visit (INDEPENDENT_AMBULATORY_CARE_PROVIDER_SITE_OTHER): Admitting: Urology

## 2024-09-21 VITALS — BP 96/63 | HR 111 | Ht 64.0 in | Wt 198.0 lb

## 2024-09-21 DIAGNOSIS — Z2989 Encounter for other specified prophylactic measures: Secondary | ICD-10-CM | POA: Diagnosis not present

## 2024-09-21 DIAGNOSIS — R972 Elevated prostate specific antigen [PSA]: Secondary | ICD-10-CM | POA: Diagnosis not present

## 2024-09-21 DIAGNOSIS — N402 Nodular prostate without lower urinary tract symptoms: Secondary | ICD-10-CM | POA: Diagnosis not present

## 2024-09-21 DIAGNOSIS — N4231 Prostatic intraepithelial neoplasia: Secondary | ICD-10-CM

## 2024-09-21 MED ORDER — GENTAMICIN SULFATE 40 MG/ML IJ SOLN
80.0000 mg | Freq: Once | INTRAMUSCULAR | Status: AC
  Administered 2024-09-21: 80 mg via INTRAMUSCULAR

## 2024-09-21 MED ORDER — LIDOCAINE HCL 1 % IJ SOLN
10.0000 mL | Freq: Once | INTRAMUSCULAR | Status: AC
  Administered 2024-09-21: 10 mL

## 2024-09-21 MED ORDER — LEVOFLOXACIN 500 MG PO TABS
500.0000 mg | ORAL_TABLET | Freq: Once | ORAL | Status: AC
  Administered 2024-09-21: 500 mg via ORAL

## 2024-09-21 NOTE — Progress Notes (Signed)
   09/21/24  Indication: Increased PSA velocity, left-sided prostate nodule  Prostate Biopsy Procedure   Informed consent was obtained, and we discussed the risks of bleeding and infection/sepsis. A time out was performed to ensure correct patient identity.  Pre-Procedure: - Last PSA Level: 3.9 - Gentamicin and levaquin given for antibiotic prophylaxis - Transrectal Ultrasound performed revealing a 28 gm prostate, PSA density 0.14 - No significant hypoechoic or median lobe noted  Procedure: - Prostate block performed using 10 cc 1% lidocaine  and biopsies taken from sextant areas, a total of 12 under ultrasound guidance.  Post-Procedure: - Patient tolerated the procedure well - He was counseled to seek immediate medical attention if experiences significant bleeding, fevers, or severe pain - Return in one week to discuss biopsy results  Assessment/ Plan: Will follow up in 1-2 weeks to discuss pathology  Daniel Burnet, MD 09/21/2024

## 2024-09-23 LAB — PROSTATE CORE NEEDLE BIOPSY

## 2024-09-27 NOTE — Progress Notes (Signed)
 DIVISION OF PULMONARY AND CRITICAL CARE MEDICINE                              FOLLOW UP ENCOUNTER     Chief complaint: Moderate persistent asthma with OSA overlap  History of Present Illness Daniel [name] is a 53 year old male with a history of respiratory issues and sleep apnea who presents for follow-up of his pulmonary function.  He attempted to receive the RSV vaccine, but the pharmacy indicated that insurance requires a doctor's authorization.  He underwent a breathing test today. Previously, his FEV1 was 2.8, in the mid-seventies percentile, and now it is in the mid-eighties. His total lung capacity has increased from 69% to 89%, and his DLCO has improved from 70% to 93%. He feels better in terms of breathing, although he occasionally experiences wheezing.  He is currently using albuterol  and a nebulizer, and takes Singulair  at night for allergies. Singulair  is effective without any side effects and he does not require any medication refills at this time.  Regarding sleep apnea, he is unable to use his CPAP machine due to lack of a stable place to stay and plug it in. He is scheduled for another sleep study in November to explore alternative options.  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   Past Medical History:   Past Medical History:  Diagnosis Date  . Allergic state 1972   Penicillin  . Asthma, unspecified asthma severity, unspecified whether complicated, unspecified whether persistent (HHS-HCC)   . Bipolar affective disorder (CMS/HHS-HCC)   . Bipolar II disorder, mild, hypomanic, with mixed features, in full remission (CMS/HHS-HCC)   . Bronchitis, chronic (CMS/HHS-HCC)   . Chest pain   . Chronic back pain    On disability   . Chronic narcotic use    For back pain   . DDD (degenerative disc disease), lumbar 01/02/2015  . Depression 2010   . Diabetes mellitus type 2, uncomplicated (CMS/HHS-HCC)   . DM2 (diabetes mellitus, type 2) (CMS/HHS-HCC) 10/30/2013   On metformin, added lipitor 01/13/2014 to control LDL,   The 10-year ASCVD risk score Verdon DC Jr., et al., 2013) is: 3.1%- did not recommend aspirin yet as risk score not over 10.     . Erectile dysfunction   . GERD (gastroesophageal reflux disease) 2014  . History of headache 02/15/2019  . Hyperlipidemia 06/26/2013  . Insomnia   . Low back pain 2005   Slipped and Herniated Disc  . Lumbar radiculitis 01/02/2015  . Migraine   . Seborrhea 09/23/2017  . Sleep apnea   . would consider routine STI screen, high risk behavior. 06/01/2018    Past Surgical History:   Past Surgical History:  Procedure Laterality Date  . KNEE ARTHROSCOPY  2014  . FRACTURE SURGERY  11/06/2016   Coccyx Surgury on 01/15/2017  . BACK SURGERY  01/15/2017   Tailbone removed  . Limited arthroscopic debridement, arthroscopic subacromial decompression, arthroscopic rotator cuff repair, arthroscopic excision of distal clavicle, and mini-open biceps tenodesis, right shoulder. Right 10/13/2017   Dr. Edie  . COLONOSCOPY W/BIOPSY  03/04/2018   Procedure: COLONOSCOPY, FLEXIBLE; WITH BIOPSY, SINGLE OR MULTIPLE;  Surgeon:  Russella Missy Bers, MD;  Location: [facility] END[facility];  Service: Gastroenterology;;  . ARTHROPLASTY TOTAL KNEE Right 12/06/2019  . EGD @ [facility]  03/17/2023   Normal EGD biopsies/Abnormal esophageal motility/Repeat PRN/CTL  . ESOPHAGOGASTRODOUDENOSCOPY W/BIOPSY N/A 12/23/2023   Procedure: EGD - Upper Endoscopy with BRAVO and FLIP;  Surgeon: Debarah Charlie Franky Mickey., MD;  Location: [facility] END[facility];  Service: Gastroenterology;  Laterality: N/A;  . ESOPHAGEAL DILATION N/A 12/23/2023   Procedure: FLIP - ESOPHAGEAL BALLOON DISTENSION STUDY;  Surgeon: Debarah Charlie Franky Mickey., MD;  Location: [facility] END[facility];  Service: Gastroenterology;  Laterality: N/A;  . OTHER SURGERY  06/01/2024    Nerve Stimulator- Back  . Bone spur removal N/A    Back of Neck - Cervical Area  . CATARACT EXTRACTION    . CHOLECYSTECTOMY    . HFX      Allergies:   Allergies  Allergen Reactions  . Doxycycline Vomiting, Other (See Comments) and Nausea And Vomiting  . Benzylparaben Other (See Comments)    Confirmed by allergy testing Able to wear latex gloves  . Ketorolac  Unknown, Itching and Other (See Comments)    Other reaction(s): Unknown  . Omega 3-Dha-Epa-Fish Oil Other (See Comments)    By allergy testing  . Omega-3-Dha-Epa-Dpa Unknown    By allergy testing  . Other Unknown    Mercaptobenzothiazole  . Other Omega-3s Other (See Comments)    Other reaction(s): Unknown Mercaptobenzothiazole   . Paraben Unknown  . Tramadol Itching and Other (See Comments)    Tolerates extended release  Tolerates extended release Other reaction(s):     Tolerates extended release  . Penicillins Rash, Hives and Other (See Comments)    Can take keflex  Has patient had a PCN reaction causing immediate rash, facial/tongue/throat swelling, SOB or lightheadedness with hypotension: Yes Has patient had a PCN reaction causing severe rash involving mucus membranes or skin necrosis: No Has patient had a PCN reaction that required hospitalization No Has patient had a PCN reaction occurring within the last 10 years: No If all of the above answers are NO, then may proceed with Cephalosporin use. Other reaction(s):  Can take keflex    Can take keflex    Current Medications:   Prior to Admission medications  Medication Sig Taking? Last Dose  albuterol  (PROVENTIL ) 2.5 mg /3 mL (0.083 %) nebulizer solution Take 3 mLs (2.5 mg total) by nebulization every 4 (four) hours as needed for Wheezing Yes PRN Not Currently Taking  albuterol  MDI, PROVENTIL , VENTOLIN , PROAIR , HFA 90 mcg/actuation inhaler Inhale 2 inhalations into the lungs every 4 (four) hours as needed for Wheezing Yes PRN Not Currently Taking  atorvastatin   (LIPITOR) 40 MG tablet Take 1 tablet (40 mg total) by mouth at bedtime Yes Taking  blood-glucose,receiver,cont (DEXCOM G7 RECEIVER) Misc Use 1 Device as directed Yes Taking  budesonide (PULMICORT) 1 mg/2 mL nebulizer solution INHALE 1 VIAL VIA NEBULIZER ONCE DAILY Yes Taking  celecoxib  (CELEBREX ) 200 MG capsule Take 1 capsule (200 mg total) by mouth 2 (two) times daily Yes Taking  DESCOVY 200-25 mg tablet Take 1 tablet by mouth once daily Yes Taking  diazePAM (VALIUM) 5 MG tablet Take 1 tablet 30 minutes prior to MRI and one at time of the MRI scan. Yes PRN Not Currently Taking  diclofenac (VOLTAREN) 1 % topical gel Apply 2 g topically 2 (two) times daily Yes Taking  divalproex  (DEPAKOTE ) 250 MG DR tablet Take 1 tablet (250 mg total) by mouth 2 (two) times daily for 90  days Yes Taking  doxycycline (VIBRA-TABS) 100 MG tablet TAKE 2 TABLETS BY MOUTH UP TO 72 HOURS AFTER SEX. DO NOT EXCEED 2 TABLETS IN 24 HOURS Yes Taking  DULoxetine  (CYMBALTA ) 20 MG DR capsule Take 20 mg by mouth at bedtime Yes Taking  empagliflozin  (JARDIANCE ) 25 mg tablet Take 1 tablet (25 mg total) by mouth once daily Yes Taking  famotidine  (PEPCID ) 20 MG tablet Take 20 mg by mouth 2 (two) times daily Yes Taking  FLUoxetine  (PROZAC ) 20 MG capsule Take 1 capsule (20 mg total) by mouth once daily for 90 days Yes Taking  fremanezumab-vfrm 225 mg/1.5 mL AtIn Inject 225 mg subcutaneously monthly Yes Taking  gabapentin  (NEURONTIN ) 300 MG capsule TAKE 2 CAPSULES BY MOUTH 3 TIMES DAILY Yes Taking  lidocaine  (LIDODERM ) 5 % patch Apply 1 patch topically Yes Taking  melatonin 3 mg Tab 1 tab by mouth at bedtime Yes Taking  methocarbamoL  (ROBAXIN ) 750 MG tablet Take 1 tablet (750 mg total) by mouth 2 (two) times daily Yes Taking  montelukast  (SINGULAIR ) 10 mg tablet Take 1 tablet (10 mg total) by mouth at bedtime Yes Taking  multivitamin tablet Take 1 tablet by mouth once daily Yes Taking  nortriptyline  (PAMELOR ) 50 MG capsule Take 1 capsule  (50 mg total) by mouth at bedtime Yes Taking  onabotulinumtoxinA (BOTOX) 200 unit SolR Provider to inject 200 units into multiple areas of head and neck every 90 days Yes Taking  pantoprazole  (PROTONIX ) 40 MG DR tablet Take 1 tablet (40 mg total) by mouth 2 (two) times daily before meals Yes Taking  pseudoephedrine -guaiFENesin  (MUCINEX  D) 60-600 mg XR tablet Take 1 tablet by mouth every 12 (twelve) hours Yes Taking  psyllium (METAMUCIL) 0.52 gram capsule  Yes Taking  SITagliptin phosphate (JANUVIA) 100 MG tablet Take 1 tablet (100 mg total) by mouth once daily Yes Taking  tadalafiL  (CIALIS ) 5 MG tablet Take 5 mg by mouth once daily Yes Taking  tamsulosin  (FLOMAX ) 0.4 mg capsule Take 0.4 mg by mouth once daily Yes Taking  tiZANidine  (ZANAFLEX ) 4 MG tablet Take 1 tablet (4 mg total) by mouth 3 (three) times daily as needed Yes Taking  traZODone  (DESYREL ) 150 MG tablet Take 1 tablet (150 mg total) by mouth at bedtime as needed for Sleep for up to 90 days Yes Taking    Family History:   Family History  Problem Relation Name Age of Onset  . Alzheimer's disease Mother Renzo Vincelette   . Depression Mother Vearl Allbaugh   . Diabetes type II Mother [name]   . High blood pressure (Hypertension) Mother Joell Usman   . Osteoporosis (Thinning of bones) Mother Thaine Garriga   . Skin cancer Mother Henok Heacock        Passed Away  . Kidney disease Mother Joshaua Epple   . Diabetes Mother [address]   . GI problems Mother Miguelangel Korn   . Kidney disease Father Marquette Blodgett   . Diabetes type II Father Nikolai Wilczak        [address]  . High blood pressure (Hypertension) Father Juanmanuel Marohl        [address]  . Prostate cancer Father Sharif Rendell        [facility]  . Skin cancer Father Aubery Douthat        [facility]  . Stroke Father Angus Amini        [address]  . Cancer Father Adelaido Nicklaus   . Diabetes Father Jonell Krontz  Passed Away  . Colon cancer Father Ryann Leavitt   . Alzheimer's disease  Maternal Grandmother Johnnye Pear   . High blood pressure (Hypertension) Maternal Grandmother [employer]  . Stroke Maternal Grandfather [facility]        [address]  . Alzheimer's disease Maternal Grandfather Johnnye Pear   . High blood pressure (Hypertension) Maternal Grandfather [employer]  . Thyroid disease Paternal Grandmother Azavier Creson        [address]  . High blood pressure (Hypertension) Paternal Grandmother Lawyer        Passed Away  . Depression Paternal Grandfather Satish Hammers   . Stroke Paternal Grandfather Miquan Tandon        [address]  . High blood pressure (Hypertension) Paternal Grandfather Charlie Ruffalo        Passed Away  . Lung cancer Maternal Aunt lillie   . High blood pressure (Hypertension) Maternal Uncle Helayne Pear   . Skin cancer Maternal Uncle Helayne Pear   . Diabetes type II Maternal Uncle [employer]  . Diabetes Maternal Uncle [employer]  . Diabetes type II Maternal Uncle [employer]  . High blood pressure (Hypertension) Maternal Uncle [employer]  . Diabetes Maternal Uncle Gaither Pear        [address]  . Kidney disease Maternal Uncle Gaither Pear   . Thyroid disease Paternal Aunt Nathanel Mayer   . Diabetes type II Paternal Aunt Zelda Moose   . Thyroid disease Paternal Aunt Zelda Moose   . Diabetes Paternal Aunt Zelda Moose   . High blood pressure (Hypertension) Paternal Aunt Zelda Moose   . Diabetes type II Paternal Syncere Eble   . Diabetes type II Paternal Uncle Zanden Colver        [address]  . Skin cancer Paternal Uncle Areon Cocuzza   . Diabetes Paternal Uncle Eriberto Felch        [address]  . High blood pressure (Hypertension) Paternal Uncle Ollis Daudelin   . Anesthesia problems Neg Hx      Social History:   Social History   Socioeconomic History  . Marital status: Single  . Highest education  level: 12th grade  Tobacco Use  . Smoking status: Former    Current packs/day: 0.00    Average packs/day: 0.5 packs/day for 1 year (0.5 ttl pk-yrs)    Types: Cigarettes    Start date: 2017    Quit date: 2018    Years since quitting: 7.8  . Smokeless tobacco: Never  Vaping Use  . Vaping status: Never Used  Substance and Sexual Activity  . Alcohol use: Never  . Drug use: Never  . Sexual activity: Yes    Partners: Male    Birth control/protection: Condom    Comment: I have a problem keeping a erection need help with it  Other Topics Concern  . Would you please tell us  about the people who live in your home, your pets, or anything else important to your social life? No  Social History Narrative   Has lifetime same sex partner     Quit smoking.  Denies the use of alcoholic beverages, illegal or recreational drugs,  caffeinated drinks.    He is disabled because of his bipolar disorder and chronic back pain . Was an Biomedical scientist.   Social Drivers of Corporate investment banker Strain: Low Risk  (09/05/2024)   Overall Financial Resource Strain (CARDIA)   . Difficulty of Paying Living Expenses: Not hard at all  Food Insecurity: Food Insecurity Present (09/05/2024)   Hunger Vital Sign   . Worried About Programme researcher, broadcasting/film/video in the Last Year: Often true   . Ran Out of Food in the Last Year: Often true  Transportation Needs: No Transportation Needs (09/05/2024)   PRAPARE - Transportation   . Lack of Transportation (Medical): No   . Lack of Transportation (Non-Medical): No  Physical Activity: Inactive (12/17/2023)   Exercise Vital Sign   . Days of Exercise per Week: 0 days   . Minutes of Exercise per Session: 0 min  Stress: No Stress Concern Present (12/17/2023)   [employer] of Occupational Health - Occupational Stress Questionnaire   . Feeling of Stress : Not at all  Social Connections: Moderately Isolated (12/17/2023)   Social Connection and Isolation Panel   . Frequency of  Communication with Friends and Family: More than three times a week   . Frequency of Social Gatherings with Friends and Family: More than three times a week   . Attends Religious Services: More than 4 times per year   . Active Member of Clubs or Organizations: No   . Attends Banker Meetings: Never   . Marital Status: Never married  Housing Stability: High Risk (09/05/2024)   Housing Stability Vital Sign   . Unable to Pay for Housing in the Last Year: No   . Number of Times Moved in the Last Year: 1   . Homeless in the Last Year: Yes    Review of Systems:   A 10 point review of systems is negative, except for the pertinent positives and negatives detailed in the HPI.  Vitals:   Vitals:   09/26/24 1501  BP: 124/74  Pulse: 103  SpO2: 99%  Weight: 89.4 kg (197 lb 1.5 oz)  Height: 175.3 cm (5' 9)     Body mass index is 29.11 kg/m.  Physical Exam:   Physical Exam Vitals and nursing note reviewed.  Constitutional:      General: in no acute distress.    Appearance: Normal appearance. Is not ill-appearing, toxic-appearing or diaphoretic.  HENT:     Head: Normocephalic and atraumatic.     Right Ear: External ear normal.     Left Ear: External ear normal.  Eyes:     General:        Right eye: No discharge.        Left eye: No discharge.     Extraocular Movements: Extraocular movements intact.     Pupils: Pupils are equal, round, and reactive to light.  Cardiovascular:     Rate and Rhythm: Normal rate and regular rhythm.     Pulses: Normal pulses.     Heart sounds: Normal heart sounds. No murmur heard.    No friction rub. No gallop.  Abdominal:     General: Bowel sounds are normal.  Skin:    General: Skin is warm and dry.     Capillary Refill: Capillary refill takes less than 2 seconds.  Neurological:     Mental Status: Patient is alert.     Lab and Imaging Results:   Results DIAGNOSTIC FEV1: 85% (09/26/2024) Total  lung capacity: 89%  (09/26/2024) DLCO: 93% (09/26/2024)    Assessment and Plan:   Diagnoses and all orders for this visit:  Need for vaccination -     RSV Vaccine    Assessment & Plan Asthma Improved pulmonary function tests. FEV1 increased from 2.8 (mid-seventies) to mid-eighties. Total lung capacity improved from 69% to 89%. DLCO improved from 70% to 93%. Reports occasional wheezing but lungs are clear on examination. Current medications include albuterol  nebulizer and Singulair , which are effective without side effects. - Continue albuterol  nebulizer as needed. - Continue Singulair  for asthma management.  Allergic rhinitis Managed with Singulair , which is effective without side effects. - Continue Singulair  for allergic rhinitis management.  Obstructive sleep apnea Management complicated by inability to use CPAP machine due to lack of stable housing. Scheduled for another sleep study in November to evaluate for potential approval of Asperger device. - Proceed with scheduled sleep study in November to evaluate for Asperger device approval.     I spent a total of 41 minutes in both face-to-face and non-face-to-face activities, excluding procedures performed, for this visit on the date of this encounter.   This note has been created using dictation software tool and any typographical errors are purely unintentional.  Patient received an After Visit Summary

## 2024-09-28 ENCOUNTER — Ambulatory Visit: Admitting: Urology

## 2024-09-28 ENCOUNTER — Encounter: Payer: Self-pay | Admitting: Urology

## 2024-09-28 VITALS — BP 87/55 | HR 113

## 2024-09-28 DIAGNOSIS — R399 Unspecified symptoms and signs involving the genitourinary system: Secondary | ICD-10-CM

## 2024-09-28 DIAGNOSIS — N529 Male erectile dysfunction, unspecified: Secondary | ICD-10-CM

## 2024-09-28 DIAGNOSIS — N400 Enlarged prostate without lower urinary tract symptoms: Secondary | ICD-10-CM

## 2024-09-28 DIAGNOSIS — R972 Elevated prostate specific antigen [PSA]: Secondary | ICD-10-CM | POA: Diagnosis not present

## 2024-09-28 NOTE — Progress Notes (Signed)
   09/28/2024 2:48 PM   Daniel [name] [birth year] [id number]  Reason for visit: Follow up prostate biopsy results, urinary symptoms, ED, family history of prostate cancer  History: Bipolar disorder, previously followed by [facility], establish care with [facility] urology August 2023 Using Cialis  and Flomax  for urinary symptoms with good results Cialis  working well for ED History of TRT, was discontinued due to elevated hematocrit Family history of prostate cancer Negative prostate biopsy October 2025 for PSA of 3.9 with significant increase from prior and reported nodule reported by PA  Physical Exam: BP (!) 87/55 (BP Location: Left Arm, Patient Position: Sitting, Cuff Size: Normal)   Pulse (!) 113   SpO2 98%    Today: No problems post prostate biopsy Reassurance provided regarding negative prostate biopsy results, 10 to 15% chance of false negative biopsy and need for continued PSA monitoring  Plan:   PSA screening: Reassurance provided regarding negative biopsy, repeat PSA 6 months.  If continues to increase significantly would recommend prostate MRI BPH: Continue Cialis  and Flomax  ED: Continue Cialis  RTC 6 months PSA reflex to free, consider MRI if increasing significantly   Daniel JAYSON Burnet, MD  [facility] [address], [address] [place], [place] [address] [phone]

## 2024-09-28 NOTE — Patient Instructions (Signed)
 Understanding PSA Screening  What is PSA Screening? PSA stands for Prostate-Specific Antigen. It is a protein made by the prostate gland.  PSA is made by normal prostate tissue, but can be elevated in patients with prostate cancer.  There are other factors that can cause an increased PSA besides prostate cancer including an enlarged prostate(BPH), recent ejaculation, infection(prostatitis), inflammation, recent illness or procedure.  A normal PSA level is less than 4 for men under age 53.  Why is PSA Screening Important? Prostate cancer is a common cancer in men, by finding prostate cancer early before patients have symptoms we can often cure this with either surgery or radiation.  Some prostate cancers grow very slowly and do not need any intervention and can be safely monitored(active surveillance).  Our goal is to find those more aggressive prostate cancers that if untreated would spread outside the prostate and cause symptoms or death.  If prostate cancer spreads outside the prostate, it cannot usually be cured, but multiple treatments are available to slow the growth of cancer and prolong life.  Who Should Get Tested? Most patients should start PSA testing around age 53 or 30, however high risk patients with a family history of prostate cancer, African American descent, patients with a strong family history of breast cancer should consider screening earlier starting at age 53. After age 5, the risks of screening start outweigh the benefits, and routine screening is not recommended after age 5.  This is because even if you develop prostate cancer in your 4s, 66s, or 90s it tends to grow so slowly that it would not cause symptoms or problems.  What Are the Benefits?    Early detection of prostate cancer More treatment options if cancer is found  Options if you have an elevated PSA: First, a confirmatory second PSA level will always be checked to confirm elevation, as false elevations are  common and we want to avoid any invasive testing if possible If you have 2 elevated PSA levels, options include:  PHI(prostate health index) score: (fancy PSA blood test), this can help determine your risk of prostate cancer if your PSA is mildly elevated between 4 and 10 before moving to more expensive/invasive testing Prostate MRI: Imaging test that can detect areas suspicious for prostate cancer, if MRI is completely normal can potentially avoid a prostate biopsy Prostate biopsy: 10 to 15-minute procedure performed in clinic, can be uncomfortable, 1 to 2% chance of serious bleeding or infection, best test to determine if prostate cancer is present but more invasive   What Are the Risks or Downsides?    Prostate biopsy can be uncomfortable with a small risk of bleeding or infection Some prostate cancers grow very slowly and might never cause problems, and detection may lead to unnecessary treatments Possible side effects from follow-up tests or treatments

## 2024-10-10 ENCOUNTER — Encounter: Payer: Self-pay | Admitting: Radiology

## 2024-10-18 ENCOUNTER — Encounter: Payer: Self-pay | Admitting: Neurosurgery

## 2024-11-08 ENCOUNTER — Ambulatory Visit: Admitting: Neurosurgery

## 2024-11-08 ENCOUNTER — Other Ambulatory Visit: Payer: Self-pay

## 2024-11-08 ENCOUNTER — Encounter: Payer: Self-pay | Admitting: Neurosurgery

## 2024-11-08 VITALS — BP 122/60 | Ht 69.0 in | Wt 198.0 lb

## 2024-11-08 DIAGNOSIS — M4802 Spinal stenosis, cervical region: Secondary | ICD-10-CM

## 2024-11-08 DIAGNOSIS — M5412 Radiculopathy, cervical region: Secondary | ICD-10-CM

## 2024-11-08 DIAGNOSIS — Z01818 Encounter for other preprocedural examination: Secondary | ICD-10-CM

## 2024-11-08 NOTE — Progress Notes (Signed)
 Referring Physician:  [facility] [address] [place],  [place] [address]  Primary Physician:  [facility]  History of Present Illness: 11/08/2024 [name] has been doing physical therapy for the past 7 to 8 weeks.  He continues to have left greater than right arm pain.  09/08/2024 Daniel [name] is here today with a chief complaint of previous neck surgery who presents with neck and arm pain. He was referred by Dr. Gustavo for evaluation of neck pain and consideration of further treatment options.  He experiences neck pain radiating to both shoulders and extending to two fingers on his hand, described as stabbing and persisting for two months. A neck brace worn for the past week provides some relief. No recent injections or physical therapy have been undertaken. A nerve study ordered by Dr. Gustavo and performed at Dr. Clement office reportedly shows no nerve damage. He underwent neck surgery two years ago, which initially alleviated symptoms. He uses a lumbar spine stimulator effectively.  Bowel/Bladder Dysfunction: none  Conservative measures:  Physical therapy:  has participated in PT at [facility] but not within 12 months Multimodal medical therapy including regular antiinflammatories: Tylenol , Celebrex , Cymbalta , Gabapentin , Ibuprofen   Injections: 08/13/2022 C3-4 ESI  Past Surgery: 06/01/2024 [facility]) Surgeon: Toribio Badder, MD 11/19/2022 Right C5-6 Foraminotomy Surgeon: Reeves Daisy, MD  Alm DELENA Wilkie has no symptoms of cervical myelopathy.  The symptoms are causing a significant impact on the patient's life.   I have utilized the care everywhere function in epic to review the outside records available from external health systems.   Review of Systems:  A 10 point review of systems is negative, except for the pertinent positives and negatives detailed in the HPI.  Past Medical History: Past Medical History:  Diagnosis Date   Asthma     Bipolar 1 disorder (HCC)    Chronic back pain    DDD (degenerative disc disease), cervical    Depression    Diabetes mellitus without complication (HCC)    ED (erectile dysfunction)    Elevated lipids    GERD (gastroesophageal reflux disease)    Headache    Insomnia    Migraine    1-2x/month since starting emgality    Seasonal allergies    Sleep apnea    Wears hearing aid in both ears    Wheezing     Past Surgical History: Past Surgical History:  Procedure Laterality Date   CHOLECYSTECTOMY     COLONOSCOPY W/ BIOPSIES     ESOPHAGOGASTRODUODENOSCOPY (EGD) WITH PROPOFOL  N/A 03/17/2023   Procedure: ESOPHAGOGASTRODUODENOSCOPY (EGD) WITH PROPOFOL ;  Surgeon: Maryruth Ole DASEN, MD;  Location: [facility];  Service: Endoscopy;  Laterality: N/A;   FOOT ARTHRODESIS Left 05/13/2023   Procedure: ARTHRODESIS FOOT;  Surgeon: Ashley Soulier, DPM;  Location: [facility];  Service: Podiatry;  Laterality: Left;  Diabetic   FRACTURE SURGERY     tail bone   knee arthroscpy Right    PERIPHERAL NERVE STIMULATOR     POSTERIOR CERVICAL LAMINECTOMY Right 11/19/2022   Procedure: RIGHT C5-6 FORAMINOTOMY;  Surgeon: Daisy Reeves, MD;  Location: [facility];  Service: Neurosurgery;  Laterality: Right;   SHOULDER ARTHROSCOPY WITH DISTAL CLAVICLE RESECTION Right 10/13/2017   Procedure: SHOULDER ARTHROSCOPY WITH DISTAL CLAVICLE RESECTION;  Surgeon: Edie Norleen PARAS, MD;  Location: [facility];  Service: Orthopedics;  Laterality: Right;   SHOULDER ARTHROSCOPY WITH OPEN ROTATOR CUFF REPAIR Right 10/13/2017   Procedure: SHOULDER ARTHROSCOPY WITH OPEN ROTATOR CUFF REPAIR/;  Surgeon: Edie Norleen PARAS, MD;  Location: [facility];  Service: Orthopedics;  Laterality: Right;   TOTAL KNEE ARTHROPLASTY Right 12/06/2019   Procedure: TOTAL KNEE ARTHROPLASTY;  Surgeon: Kathlynn Sharper, MD;  Location: [facility];  Service: Orthopedics;  Laterality: Right;   TOTAL KNEE ARTHROPLASTY Right 05/14/2021   Procedure: REVISION TOTAL  KNEE ARTHROPLASTY - Medford Amber to Assist;  Surgeon: Kathlynn Sharper, MD;  Location: [facility];  Service: Orthopedics;  Laterality: Right;    Allergies: Allergies as of 11/08/2024 - Review Complete 09/28/2024  Allergen Reaction Noted   Doxycycline Nausea And Vomiting 03/03/2016   Benzylparaben (benzyl p-hydroxybenzoate) Other (See Comments) 05/23/2014   Dha-epa-vitamin e Other (See Comments) 05/23/2014   Ketorolac  Itching 08/22/2018   Omega 3-6-9 Other (See Comments) 05/23/2014   Omega-3  05/23/2014   Other Other (See Comments) 05/23/2014   Parabens Other (See Comments) 05/23/2014   Tramadol Itching and Other (See Comments) 03/14/2015   Penicillins Hives, Rash, and Other (See Comments) 08/10/1971    Medications:  Current Outpatient Medications:    albuterol  (PROVENTIL ) (2.5 MG/3ML) 0.083% nebulizer solution, Take 2.5 mg by nebulization every 4 (four) hours as needed for wheezing. , Disp: , Rfl:    albuterol  (VENTOLIN  HFA) 108 (90 Base) MCG/ACT inhaler, Inhale 2 puffs into the lungs every 4 (four) hours as needed for wheezing., Disp: , Rfl:    atorvastatin  (LIPITOR) 40 MG tablet, Take 40 mg by mouth at bedtime., Disp: , Rfl:    BOTOX 200 units injection, Inject 200 Units into the muscle every 3 (three) months., Disp: , Rfl:    budesonide (PULMICORT) 1 MG/2ML nebulizer solution, INHALE 1 VIAL VIA NEBULIZER ONCE DAILY, Disp: , Rfl:    celecoxib  (CELEBREX ) 200 MG capsule, Take 1 capsule by mouth 2 (two) times daily., Disp: , Rfl:    divalproex  (DEPAKOTE  ER) 250 MG 24 hr tablet, Take 250 mg by mouth in the morning and at bedtime., Disp: , Rfl:    DULoxetine  (CYMBALTA ) 20 MG capsule, Take 20 mg by mouth at bedtime., Disp: , Rfl:    empagliflozin  (JARDIANCE ) 10 MG TABS tablet, Take 10 mg by mouth daily., Disp: , Rfl:    emtricitabine-tenofovir AF (DESCOVY) 200-25 MG tablet, Take 1 tablet by mouth daily., Disp: , Rfl:    famotidine  (PEPCID ) 20 MG tablet, Take 20 mg by mouth 2 (two) times daily.,  Disp: , Rfl:    FLUoxetine  (PROZAC ) 20 MG capsule, Take 20 mg by mouth daily., Disp: , Rfl:    Fremanezumab-vfrm 225 MG/1.5ML SOAJ, Inject 225 mg into the skin every 30 (thirty) days., Disp: , Rfl:    gabapentin  (NEURONTIN ) 300 MG capsule, Take 600 mg by mouth 2 (two) times daily., Disp: , Rfl:    LIDODERM  5 %, Apply 1 patch topically daily., Disp: , Rfl:    Melatonin 3 MG TABS, Take 3 mg by mouth at bedtime. , Disp: , Rfl:    methocarbamol  (ROBAXIN ) 750 MG tablet, Take 750 mg by mouth 2 (two) times daily., Disp: , Rfl:    montelukast  (SINGULAIR ) 10 MG tablet, Take 10 mg by mouth at bedtime., Disp: , Rfl:    Multiple Vitamin (MULTIVITAMIN WITH MINERALS) TABS tablet, Take 1 tablet by mouth daily., Disp: , Rfl:    nortriptyline  (PAMELOR ) 50 MG capsule, at bedtime. Take 40mg  nightly for one week, then increase to 50mg  nightly, Disp: , Rfl:    pantoprazole  (PROTONIX ) 40 MG tablet, Take 40 mg by mouth 2 (two) times daily., Disp: , Rfl:  propranolol  (INDERAL ) 10 MG tablet, Take 10 mg by mouth 2 (two) times daily., Disp: , Rfl:    pseudoephedrine -guaifenesin  (MUCINEX  D) 60-600 MG 12 hr tablet, Take 1 tablet by mouth every 12 (twelve) hours as needed., Disp: , Rfl:    rizatriptan (MAXALT) 10 MG tablet, May take a second dose after 2 hours if needed., Disp: , Rfl:    sitaGLIPtin (JANUVIA) 100 MG tablet, Take 100 mg by mouth daily., Disp: , Rfl:    tadalafil  (CIALIS ) 20 MG tablet, Take 1 tablet (20 mg total) by mouth daily., Disp: 90 tablet, Rfl: 3   tiZANidine  (ZANAFLEX ) 4 MG tablet, Take 4 mg by mouth 3 (three) times daily., Disp: , Rfl:    traZODone  (DESYREL ) 150 MG tablet, Take 150 mg by mouth at bedtime., Disp: , Rfl:    zonisamide (ZONEGRAN) 100 MG capsule, Take 100 mg by mouth daily., Disp: , Rfl:   Social History: Social History   Tobacco Use   Smoking status: Former    Current packs/day: 0.00    Types: Cigarettes    Quit date: 11/28/2014    Years since quitting: 9.9   Smokeless tobacco:  Never  Vaping Use   Vaping status: Never Used  Substance Use Topics   Alcohol use: No    Alcohol/week: 0.0 standard drinks of alcohol   Drug use: No    Family Medical History: Family History  Problem Relation Age of Onset   Prostate cancer Father    Stroke Father    Diabetes Mother    Hypertension Mother     Physical Examination: Vitals:   11/08/24 1317  BP: 122/60    General: Patient is in no apparent distress. Attention to examination is appropriate.  Neck:   Supple.  Full range of motion.  Respiratory: Patient is breathing without any difficulty.   NEUROLOGICAL:     Awake, alert, oriented to person, place, and time.  Speech is clear and fluent.   Cranial Nerves: Pupils equal round and reactive to light.  Facial tone is symmetric.  Facial sensation is symmetric. Shoulder shrug is symmetric. Tongue protrusion is midline.  There is no pronator drift.  Strength: Side Biceps Triceps Deltoid Interossei Grip Wrist Ext. Wrist Flex.  R 5 5 5 5 5 5 5   L 5 5 5 5 5 5 5    Side Iliopsoas Quads Hamstring PF DF EHL  R 5 5 5 5 5 5   L 5 5 5 5 5 5    Reflexes are 1+ and symmetric at the biceps, triceps, brachioradialis, patella and achilles.   Hoffman's is absent.   Bilateral upper and lower extremity sensation is intact to light touch.    No evidence of dysmetria noted.  Gait is normal.     Medical Decision Making  Imaging: MR C spine 08/02/2024 IMPRESSION: 1. Comparison is made to the prior cervical spine MRI of 06/13/2022. 2. At C6-C7, there is a progressive disc bulge which is asymmetric to the left. Superimposed shallow broad-based left center disc protrusion, new from the prior MRI. The disc protrusion results in mild relative spinal canal stenosis. The disc bulge contributes to moderate left neural foraminal narrowing, which is new from the prior MRI. 3. Cervical spondylosis has otherwise not significantly changed. 4. No more than mild spinal canal stenosis at  the remaining levels. 5. Additional sites of foraminal stenosis, greatest on the right at C3-C4 (moderate) and on the right at C5-C6 (moderate-to-severe).     Electronically Signed   By:  Rockey Childs D.O.   On: 08/10/2024 14:42  I have personally reviewed the images and agree with the above interpretation.  Assessment and Plan: Daniel [name] is a pleasant 53 y.o. male with cervical radiculopathy. 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  He has tried and failed conservative management including physical therapy and medications.  At this point, no further conservative management is indicated.  Recommend that he consider C5-7 anterior cervical discectomy and fusion.  I do not feel he is an appropriate candidate for an arthroplasty given the level of arthritic changes present on his imaging.  I discussed the planned procedure at length with the patient, including the risks, benefits, alternatives, and indications. The risks discussed include but are not limited to bleeding, infection, need for reoperation, spinal fluid leak, stroke, vision loss, anesthetic complication, coma, paralysis, and even death. We also discussed the possibility of post-operative dysphagia, vocal cord paralysis, and the risk of adjacent segment disease in the future. I also described in detail that improvement was not guaranteed.  The patient expressed understanding of these risks, and asked that we proceed with surgery. I described the surgery in layman's terms, and gave ample opportunity for questions, which were answered to the best of my ability.  I spent a total of 20 minutes in this patient's care today. This time was spent reviewing pertinent records including imaging studies, obtaining and confirming history, performing a directed evaluation, formulating and discussing my recommendations, and documenting the visit within the medical record.      Thank you for involving  me in the care of this patient.      Lorella Gomez K. Clois MD, [facility] Neurosurgery

## 2024-11-08 NOTE — Patient Instructions (Signed)
 Please see below for information in regards to your upcoming surgery:   Planned surgery: C5-7 Anterior Cervical Discectomy and Fusion   Surgery date: 11/25/24 at [facility] (Medical Mall: [address] [place], [place] [address]) - you will find out your arrival time the business day before your surgery.   Pre-op appointment at [facility] Pre-admit Testing: you will receive a call with a date/time for this appointment. If you are scheduled for an in person appointment, Pre-admit Testing is located on the first floor of the [facility] building, [address], [address]. During this appointment, they will advise you which medications you can take the morning of surgery, and which medications you will need to hold for surgery. Labs (such as blood work, EKG) may be done at your pre-op appointment. You are not required to fast for these labs. Should you need to change your pre-op appointment, please call Pre-admit testing at [phone].    Diabetes/heart failure/kidney disease/weight loss medications that require an extended hold: Per anesthesia guidelines (due to the increased risk of aspiration caused by delayed gastric emptying):  Empagliflozin  (Jardiance ) - hold for 3 days prior to surgery    Surgical clearance: we will send a clearance form to Dr. Heron Nett. They may wish to see you in their office prior to signing the clearance form. If so, they may call you to schedule an appointment.   NSAIDS (Non-steroidal anti-inflammatory drugs): because you are having a fusion, please avoid taking any NSAIDS (examples: ibuprofen , motrin , aleve , naproxen , meloxicam, diclofenac) for 3 months after surgery. Celebrex  is an exception and is OK to take, if prescribed. Tylenol  is not an NSAID.    Common restrictions after spine surgery: No bending, lifting, or twisting ("BLT"). Avoid lifting objects heavier than 10 pounds for the first 6 weeks after  surgery. Where possible, avoid household activities that involve lifting, bending, reaching, pushing, or pulling such as laundry, vacuuming, grocery shopping, and childcare. Try to arrange for help from friends and family for these activities while you heal. Do not drive while taking prescription pain medication. Weeks 6 through 12 after surgery: avoid lifting more than 25 pounds.    X-rays after surgery: Because you are having a fusion or arthroplasty: for appointments after your 2 week follow-up: please arrive our office 30 minutes prior to your appointment for x-rays. This applies to every appointment after your 2 week follow-up. Failure to do so may result in your appointment being rescheduled.    How to contact us :  If you have any questions/concerns before or after surgery, you can reach us  at [phone], or you can send a mychart message. We can be reached by phone or mychart 8am-4pm, Monday-Friday.  *Please note: Calls after 4pm are forwarded to a third party answering service. Mychart messages are not routinely monitored during evenings, weekends, and holidays. Please call our office to contact the answering service for urgent concerns during non-business hours.    If you have FMLA/disability paperwork, please drop it off or fax it to [phone]   Appointments/FMLA & disability paperwork: Reche Hait, & Nichole Registered Nurse/Surgery scheduler: Kendelyn, RN & Katie, RN Certified Medical Assistants: Don, CMA, Elenor, CMA, Damien, CMA, & Auston, [place] Physician Assistants: Lyle Decamp, PA-C, Edsel Goods, PA-C & Glade Boys, PA-C Surgeons: Penne Sharps, MD & Reeves Daisy, MD    [facility] PREADMIT TESTING VISIT and SURGERY INFORMATION SHEET   Now that surgery has been scheduled you can anticipate several  phone calls from [facility]. A pharmacy technician will call you to verify your current list of medications taken at home.                The Pre-Service Center will call to verify your insurance information and to give you billing estimates and information.             The Preadmit Testing Office will be calling to schedule a visit to obtain information for the anesthesia team and provide instructions on preparation for surgery.  What can you expect for the Preadmit Testing Visit: Appointments may be scheduled in-person or by telephone.  If a telephone visit is scheduled, you may be asked to come into the office to have lab tests or other studies performed.   This visit will not be completed any greater than 14 days prior to your surgery.  If your surgery has been scheduled for a future date, please do not be alarmed if we have not contacted you to schedule an appointment more than a month prior to the surgery date.    Please be prepared to provide the following information during this appointment:            -Personal medical history                                               -Medication and allergy list            -Any history of problems with anesthesia              -Recent lab work or diagnostic studies            -Please notify us  of any needs we should be aware of to provide the best care possible           -You will be provided with instructions on how to prepare for your surgery.    On The Day of Surgery:  You must have a driver to take you home after surgery, you will be asked not to drive for 24 hours following surgery.  Taxi, Gisele and non-medical transport will not be acceptable means of transportation unless you have a responsible individual who will be traveling with you.  Visitors in the surgical area:   2 people will be able to visit you in your room once your preparation for surgery has been completed. During surgery, your visitors will be asked to wait in the Surgery Waiting Area.  It is not a requirement for them to stay, if they prefer to leave and come back.  Your visitor(s) will be given an update  once the surgery has been completed.  No visitors are allowed in the initial recovery room to respect patient privacy and safety.  Once you are more awake and transfer to the secondary recovery area, or are transferred to an inpatient room, visitors will again be able to see you.  To respect and protect your privacy: We will ask on the day of surgery who your driver will be and what the contact number for that individual will be. We will ask if it is okay to share information with this individual, or if there is an alternative individual that we, or the surgeon, should contact to provide updates and information. If family or friends come to the surgical information desk requesting information  about you, who you have not listed with us , no information will be given.   It may be helpful to designate someone as the main contact who will be responsible for updating your other friends and family.    PREADMIT TESTING OFFICE: [phone] SAME DAY SURGERY: [phone] We look forward to caring for you before and throughout the process of your surgery.

## 2024-11-09 ENCOUNTER — Telehealth: Payer: Self-pay

## 2024-11-09 NOTE — Telephone Encounter (Signed)
 Rescheduled surgery from 11/25/24 to 12/21/24 per patient request. I have notified the OR, PAT, and [employer] rep. His post op appointments have been changed accordingly. An updated clearance letter has been sent to his PCP.

## 2024-11-14 ENCOUNTER — Encounter: Payer: Self-pay | Admitting: Neurosurgery

## 2024-11-15 ENCOUNTER — Other Ambulatory Visit

## 2024-11-23 NOTE — Telephone Encounter (Signed)
 Spoke with patient, he is at his PCPs office at [facility]. I asked him to verify their fax number which is [phone], re faxing letter to them.

## 2024-11-25 NOTE — Telephone Encounter (Signed)
 RICK  I called PCP office today-Dr Jyl Madie Favor office, to follow up to make sure they received surgical clearance and was told that they sent a message to the patient yesterday letting him know he is not cleared. I asked for form/notes to be sent to our office to let us  know why and the plan for the patient as we need to know how to proceed on our end. Hopefully will get something today.

## 2024-11-25 NOTE — Telephone Encounter (Signed)
 Per a telephone note from yesterday from Dr Jyl: You are not cleared for surgery due to your iron deficiency anemia. This will need to be addressed. Please start 325 iron twice daily. Take with an over the counter vitamin C. I need to refer you to  [facility] to determine if you need a EGD and colonoscopy ( which I suspect you will). Once all this is resolved, then the neck surgery.   Looks like he sees [facility]. I do not see an appointment scheduled at this time.

## 2024-12-05 ENCOUNTER — Other Ambulatory Visit: Payer: Self-pay | Admitting: Medical Genetics

## 2024-12-05 NOTE — Telephone Encounter (Signed)
 Rescheduled surgery from 12/21/24 to 01/18/25 to allow time for surgical clearance. I have notified the OR, PAT, and [employer] rep. His post op appointments have been changed accordingly. An updated clearance letter has been sent to his PCP.

## 2024-12-12 ENCOUNTER — Ambulatory Visit
Admission: RE | Admit: 2024-12-12 | Discharge: 2024-12-12 | Disposition: A | Discharge status: Home / Self Care | Attending: Gastroenterology | Admitting: Gastroenterology

## 2024-12-12 ENCOUNTER — Other Ambulatory Visit: Payer: Self-pay

## 2024-12-12 ENCOUNTER — Ambulatory Visit: Admitting: Anesthesiology

## 2024-12-12 ENCOUNTER — Encounter: Payer: Self-pay | Admitting: Gastroenterology

## 2024-12-12 ENCOUNTER — Other Ambulatory Visit

## 2024-12-12 ENCOUNTER — Encounter
Admission: RE | Disposition: A | Discharge status: Home / Self Care | Payer: Self-pay | Source: Home / Self Care | Attending: Gastroenterology

## 2024-12-12 DIAGNOSIS — Z7984 Long term (current) use of oral hypoglycemic drugs: Secondary | ICD-10-CM | POA: Diagnosis not present

## 2024-12-12 DIAGNOSIS — K224 Dyskinesia of esophagus: Secondary | ICD-10-CM | POA: Insufficient documentation

## 2024-12-12 DIAGNOSIS — D509 Iron deficiency anemia, unspecified: Secondary | ICD-10-CM | POA: Insufficient documentation

## 2024-12-12 DIAGNOSIS — K635 Polyp of colon: Secondary | ICD-10-CM | POA: Diagnosis not present

## 2024-12-12 DIAGNOSIS — K219 Gastro-esophageal reflux disease without esophagitis: Secondary | ICD-10-CM | POA: Insufficient documentation

## 2024-12-12 DIAGNOSIS — K6389 Other specified diseases of intestine: Secondary | ICD-10-CM | POA: Insufficient documentation

## 2024-12-12 DIAGNOSIS — E119 Type 2 diabetes mellitus without complications: Secondary | ICD-10-CM | POA: Diagnosis not present

## 2024-12-12 DIAGNOSIS — K621 Rectal polyp: Secondary | ICD-10-CM | POA: Insufficient documentation

## 2024-12-12 DIAGNOSIS — K2970 Gastritis, unspecified, without bleeding: Secondary | ICD-10-CM | POA: Insufficient documentation

## 2024-12-12 DIAGNOSIS — Z87891 Personal history of nicotine dependence: Secondary | ICD-10-CM | POA: Insufficient documentation

## 2024-12-12 DIAGNOSIS — M1990 Unspecified osteoarthritis, unspecified site: Secondary | ICD-10-CM | POA: Diagnosis not present

## 2024-12-12 DIAGNOSIS — J45909 Unspecified asthma, uncomplicated: Secondary | ICD-10-CM | POA: Insufficient documentation

## 2024-12-12 HISTORY — PX: POLYPECTOMY: SHX149

## 2024-12-12 HISTORY — PX: ESOPHAGOGASTRODUODENOSCOPY: SHX5428

## 2024-12-12 HISTORY — PX: COLONOSCOPY: SHX5424

## 2024-12-12 HISTORY — PX: BIOPSY OF SKIN SUBCUTANEOUS TISSUE AND/OR MUCOUS MEMBRANE: SHX6741

## 2024-12-12 SURGERY — COLONOSCOPY
Anesthesia: General

## 2024-12-12 MED ORDER — LIDOCAINE HCL (PF) 2 % IJ SOLN
INTRAMUSCULAR | Status: AC
  Filled 2024-12-12: qty 5

## 2024-12-12 MED ORDER — DEXMEDETOMIDINE HCL IN NACL 80 MCG/20ML IV SOLN
INTRAVENOUS | Status: DC | PRN
  Administered 2024-12-12: 8 ug via INTRAVENOUS
  Administered 2024-12-12: 12 ug via INTRAVENOUS

## 2024-12-12 MED ORDER — DEXMEDETOMIDINE HCL IN NACL 80 MCG/20ML IV SOLN
INTRAVENOUS | Status: AC
  Filled 2024-12-12: qty 20

## 2024-12-12 MED ORDER — PROPOFOL 1000 MG/100ML IV EMUL
INTRAVENOUS | Status: AC
  Filled 2024-12-12: qty 100

## 2024-12-12 MED ORDER — SODIUM CHLORIDE 0.9 % IV SOLN: INTRAVENOUS | Status: DC

## 2024-12-12 MED ORDER — GLYCOPYRROLATE 0.2 MG/ML IJ SOLN
INTRAMUSCULAR | Status: AC
  Filled 2024-12-12: qty 1

## 2024-12-12 MED ORDER — PROPOFOL 500 MG/50ML IV EMUL
INTRAVENOUS | Status: DC | PRN
  Administered 2024-12-12: 75 ug/kg/min via INTRAVENOUS

## 2024-12-12 MED ORDER — LIDOCAINE HCL (CARDIAC) PF 100 MG/5ML IV SOSY
PREFILLED_SYRINGE | INTRAVENOUS | Status: DC | PRN
  Administered 2024-12-12: 80 mg via INTRAVENOUS

## 2024-12-12 MED ORDER — PROPOFOL 10 MG/ML IV BOLUS
INTRAVENOUS | Status: DC | PRN
  Administered 2024-12-12 (×2): 50 mg via INTRAVENOUS

## 2024-12-12 MED ORDER — ONDANSETRON HCL 4 MG/2ML IJ SOLN
INTRAMUSCULAR | Status: AC
  Filled 2024-12-12: qty 2

## 2024-12-12 NOTE — Op Note (Signed)
 [facility]enter[facility] Patient Name: Daniel [name] Procedure Date: 12/12/2024 1:08 PM MRN: [id number] Account #: [id number] Date of Birth: 10/17/1971 Admit Type: Outpatient Age: 54 Room: [facility] ENDO ROOM 2 Gender: Male Note Status: Finalized Instrument Name: Upper GI Scope [id number] Procedure:             Upper GI endoscopy Indications:           Iron deficiency anemia Providers:             Elspeth Ozell Onita ROSALEA, DO Referring MD:          Heron Nett MD, MD (Referring MD) Medicines:             Monitored Anesthesia Care Complications:         No immediate complications. Estimated blood loss:                         Minimal. Procedure:             Pre-Anesthesia Assessment:                        - Prior to the procedure, a History and Physical was                         performed, and patient medications and allergies were                         reviewed. The patient is competent. The risks and                         benefits of the procedure and the sedation options and                         risks were discussed with the patient. All questions                         were answered and informed consent was obtained.                         Patient identification and proposed procedure were                         verified by the physician, the nurse, the anesthetist                         and the technician in the endoscopy suite. Mental                         Status Examination: alert and oriented. Airway                         Examination: normal oropharyngeal airway and neck                         mobility. Respiratory Examination: clear to                         auscultation. CV Examination: regular rate and rhythm.  Prophylactic Antibiotics: The patient does not require                         prophylactic antibiotics. Prior Anticoagulants: The                         patient has taken no anticoagulant or antiplatelet                          agents. ASA Grade Assessment: III - A patient with                         severe systemic disease. After reviewing the risks and                         benefits, the patient was deemed in satisfactory                         condition to undergo the procedure. The anesthesia                         plan was to use monitored anesthesia care (MAC).                         Immediately prior to administration of medications,                         the patient was re-assessed for adequacy to receive                         sedatives. The heart rate, respiratory rate, oxygen                         saturations, blood pressure, adequacy of pulmonary                         ventilation, and response to care were monitored                         throughout the procedure. The physical status of the                         patient was re-assessed after the procedure.                        After obtaining informed consent, the endoscope was                         passed under direct vision. Throughout the procedure,                         the patient's blood pressure, pulse, and oxygen                         saturations were monitored continuously. The Endoscope                         was introduced through the mouth, and advanced to the  third part of duodenum. Findings:      The duodenal bulb, first portion of the duodenum, second portion of the       duodenum and third portion of the duodenum were normal. Biopsies for       histology were taken with a cold forceps for evaluation of celiac       disease. Estimated blood loss was minimal.      Localized moderate inflammation characterized by erosions and erythema       was found in the gastric antrum. Biopsies were taken with a cold forceps       for histology. Biopsies were taken with a cold forceps for histology.       Biopsies were taken with a cold forceps for Helicobacter pylori testing.        Estimated blood loss was minimal.      The exam of the stomach was otherwise normal.      The Z-line was regular. Estimated blood loss: none.      Esophagogastric landmarks were identified: the gastroesophageal junction       was found at 40 cm from the incisors.      Abnormal motility was noted at the lower esophageal sphincter. The       cricopharyngeus was normal. There is spasticity of the esophageal body.       The distal esophagus/lower esophageal sphincter is spastic, but gives up       passage to the endoscope. Estimated blood loss: none.      The exam of the esophagus was otherwise normal. Impression:            - Normal duodenal bulb, first portion of the duodenum,                         second portion of the duodenum and third portion of                         the duodenum. Biopsied.                        - Gastritis. Biopsied.                        - Z-line regular.                        - Esophagogastric landmarks identified.                        - Abnormal esophageal motility. Recommendation:        - Patient has a contact number available for                         emergencies. The signs and symptoms of potential                         delayed complications were discussed with the patient.                         Return to normal activities tomorrow. Written                         discharge instructions were provided to the patient.                        -  Discharge patient to home.                        - Resume previous diet.                        - Continue present medications.                        - Await pathology results.                        - Return to GI clinic as previously scheduled.                        - The findings and recommendations were discussed with                         the patient. Procedure Code(s):     --- Professional ---                        [id number], Esophagogastroduodenoscopy, flexible,                          transoral; with biopsy, single or multiple Diagnosis Code(s):     --- Professional ---                        K29.70, Gastritis, unspecified, without bleeding                        K22.4, Dyskinesia of esophagus                        D50.9, Iron deficiency anemia, unspecified CPT copyright 2022 [employer]. All rights reserved. The codes documented in this report are preliminary and upon coder review may  be revised to meet current compliance requirements. Attending Participation:      I personally performed the entire procedure. Elspeth Jungling, DO Elspeth Ozell Jungling DO, DO 12/12/2024 1:32:22 PM This report has been signed electronically. Number of Addenda: 0 Note Initiated On: 12/12/2024 1:08 PM Estimated Blood Loss:  Estimated blood loss was minimal.      [facility]

## 2024-12-12 NOTE — Op Note (Signed)
 [facility] Gastroenterology Patient Name: Bartlomiej Jenkinson Procedure Date: 12/12/2024 1:32 PM MRN: [id number] Account #: [id number] Date of Birth: February 21, 1971 Admit Type: Outpatient Age: 54 Room: [facility] ENDO ROOM 2 Gender: Male Note Status: Finalized Instrument Name: Colon Scope [id number] Procedure:             Colonoscopy Indications:           Iron deficiency anemia Providers:             Elspeth Ozell Onita ROSALEA, DO Referring MD:          Heron Nett MD, MD (Referring MD) Medicines:             Monitored Anesthesia Care Complications:         No immediate complications. Estimated blood loss:                         Minimal. Procedure:             Pre-Anesthesia Assessment:                        - Prior to the procedure, a History and Physical was                         performed, and patient medications and allergies were                         reviewed. The patient is competent. The risks and                         benefits of the procedure and the sedation options and                         risks were discussed with the patient. All questions                         were answered and informed consent was obtained.                         Patient identification and proposed procedure were                         verified by the physician, the nurse, the anesthetist                         and the technician in the endoscopy suite. Mental                         Status Examination: alert and oriented. Airway                         Examination: normal oropharyngeal airway and neck                         mobility. Respiratory Examination: clear to                         auscultation. CV Examination: regular rate and rhythm.  Prophylactic Antibiotics: The patient does not require                         prophylactic antibiotics. Prior Anticoagulants: The                         patient has taken no anticoagulant or antiplatelet                          agents. ASA Grade Assessment: III - A patient with                         severe systemic disease. After reviewing the risks and                         benefits, the patient was deemed in satisfactory                         condition to undergo the procedure. The anesthesia                         plan was to use monitored anesthesia care (MAC).                         Immediately prior to administration of medications,                         the patient was re-assessed for adequacy to receive                         sedatives. The heart rate, respiratory rate, oxygen                         saturations, blood pressure, adequacy of pulmonary                         ventilation, and response to care were monitored                         throughout the procedure. The physical status of the                         patient was re-assessed after the procedure.                        After obtaining informed consent, the colonoscope was                         passed under direct vision. Throughout the procedure,                         the patient's blood pressure, pulse, and oxygen                         saturations were monitored continuously. The                         Colonoscope was introduced through the anus and  advanced to the the terminal ileum, with                         identification of the appendiceal orifice and IC                         valve. The colonoscopy was performed without                         difficulty. The patient tolerated the procedure well.                         The quality of the bowel preparation was evaluated                         using the BBPS [facility] Bowel Preparation Scale) with                         scores of: Right Colon = 2 (minor amount of residual                         staining, small fragments of stool and/or opaque                         liquid, but mucosa seen well) and Left Colon = 2                          (minor amount of residual staining, small fragments of                         stool and/or opaque liquid, but mucosa seen well). The                         total BBPS score equals 4. The quality of the bowel                         preparation was good. The terminal ileum, ileocecal                         valve, appendiceal orifice, and rectum were                         photographed. Findings:      The perianal and digital rectal examinations were normal. Pertinent       negatives include normal sphincter tone.      The terminal ileum appeared normal. Estimated blood loss: none.      Retroflexion in the right colon was performed.      A localized area of granular mucosa was found in the cecum. Biopsies       were taken with a cold forceps for histology. Estimated blood loss was       minimal.      A 1 to 2 mm polyp was found in the rectum. The polyp was sessile. The       polyp was removed with a jumbo cold forceps. Resection and retrieval       were complete. Estimated blood loss was minimal.      The exam  was otherwise without abnormality on direct and retroflexion       views. Impression:            - The examined portion of the ileum was normal.                        - Granularity in the cecum. Biopsied.                        - One 1 to 2 mm polyp in the rectum, removed with a                         jumbo cold forceps. Resected and retrieved.                        - The examination was otherwise normal on direct and                         retroflexion views. Recommendation:        - Patient has a contact number available for                         emergencies. The signs and symptoms of potential                         delayed complications were discussed with the patient.                         Return to normal activities tomorrow. Written                         discharge instructions were provided to the patient.                        - Discharge  patient to home.                        - Resume previous diet.                        - Continue present medications.                        - Await pathology results.                        - Repeat colonoscopy for surveillance based on                         pathology results.                        - Return to referring physician as previously                         scheduled.                        - Return to GI office as previously scheduled.                        -  The findings and recommendations were discussed with                         the patient. 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 All rights reserved. The codes documented in this report are preliminary and upon coder review may  be revised to meet current compliance requirements. Attending Participation:      I personally performed the entire procedure. Elspeth Jungling, DO Elspeth Ozell Jungling DO, DO 12/12/2024 2:09:43 PM This report has been signed electronically. Number of Addenda: 0 Note Initiated On: 12/12/2024 1:32 PM Scope Withdrawal Time: 0 hours 20 minutes 16 seconds  Total Procedure Duration: 0 hours 22 minutes 14 seconds  Estimated Blood Loss:  Estimated blood loss was minimal.      [facility]

## 2024-12-12 NOTE — H&P (Signed)
 "  Pre-Procedure H&P   Patient ID: Daniel [name] is a 54 y.o. male.  Gastroenterology Provider: Elspeth Ozell Jungling, DO  Referring Provider: Wanda Gails, NP PCP: [facility]  Date: 12/12/2024  HPI [name] is a 54 y.o. male who presents today for Esophagogastroduodenoscopy and Colonoscopy for Iron deficiency anemia .  Patient with history of gastroparesis, esophageal dysmotility with dysphagia.  He underwent EGD in January 2025 with food present in the esophagus and the stomach.  He underwent Bravo and flip studies at that time as well.  High-resolution manometry in May 2024.  Biopsies on previous EGDs negative for EOE.  Last colonoscopy March 2019 with normal terminal ileum.  Biopsies negative for microscopic colitis.  Otherwise normal colon  Negative for celiac  Recent hemoglobin 10.7 with ferritin of 5 iron sat 4% MCV 79   Past Medical History:  Diagnosis Date   Asthma    Bipolar 1 disorder (HCC)    Chronic back pain    DDD (degenerative disc disease), cervical    Depression    Diabetes mellitus without complication (HCC)    ED (erectile dysfunction)    Elevated lipids    GERD (gastroesophageal reflux disease)    Headache    Insomnia    Migraine    1-2x/month since starting emgality    Seasonal allergies    Wears hearing aid in both ears    Wheezing     Past Surgical History:  Procedure Laterality Date   CHOLECYSTECTOMY     COLONOSCOPY W/ BIOPSIES     ESOPHAGOGASTRODUODENOSCOPY (EGD) WITH PROPOFOL  N/A 03/17/2023   Procedure: ESOPHAGOGASTRODUODENOSCOPY (EGD) WITH PROPOFOL ;  Surgeon: Maryruth Ole DASEN, MD;  Location: [facility];  Service: Endoscopy;  Laterality: N/A;   FOOT ARTHRODESIS Left 05/13/2023   Procedure: ARTHRODESIS FOOT;  Surgeon: Ashley Soulier, DPM;  Location: [facility];  Service: Podiatry;  Laterality: Left;  Diabetic   FRACTURE SURGERY     tail bone   knee arthroscpy Right    PERIPHERAL NERVE STIMULATOR      POSTERIOR CERVICAL LAMINECTOMY Right 11/19/2022   Procedure: RIGHT C5-6 FORAMINOTOMY;  Surgeon: Clois Fret, MD;  Location: [facility];  Service: Neurosurgery;  Laterality: Right;   SHOULDER ARTHROSCOPY WITH DISTAL CLAVICLE RESECTION Right 10/13/2017   Procedure: SHOULDER ARTHROSCOPY WITH DISTAL CLAVICLE RESECTION;  Surgeon: Edie Norleen PARAS, MD;  Location: [facility];  Service: Orthopedics;  Laterality: Right;   SHOULDER ARTHROSCOPY WITH OPEN ROTATOR CUFF REPAIR Right 10/13/2017   Procedure: SHOULDER ARTHROSCOPY WITH OPEN ROTATOR CUFF REPAIR/;  Surgeon: Edie Norleen PARAS, MD;  Location: [facility];  Service: Orthopedics;  Laterality: Right;   TOTAL KNEE ARTHROPLASTY Right 12/06/2019   Procedure: TOTAL KNEE ARTHROPLASTY;  Surgeon: Kathlynn Ozell, MD;  Location: [facility];  Service: Orthopedics;  Laterality: Right;   TOTAL KNEE ARTHROPLASTY Right 05/14/2021   Procedure: REVISION TOTAL KNEE ARTHROPLASTY - [place]ford Amber to Assist;  Surgeon: Kathlynn Ozell, MD;  Location: [facility];  Service: Orthopedics;  Laterality: Right;    Family History Father- crc 38s No other h/o GI disease or malignancy  Review of Systems  Constitutional:  Negative for activity change, appetite change, chills, diaphoresis, fatigue, fever and unexpected weight change.  HENT:  Negative for trouble swallowing and voice change.   Respiratory:  Negative for shortness of breath and wheezing.   Cardiovascular:  Negative for chest pain, palpitations and leg swelling.  Gastrointestinal:  Positive for nausea and vomiting. Negative for abdominal distention, abdominal pain, anal bleeding,  blood in stool, constipation and diarrhea.  Musculoskeletal:  Negative for arthralgias and myalgias.  Skin:  Negative for color change and pallor.  Neurological:  Negative for dizziness, syncope and weakness.  Psychiatric/Behavioral:  Negative for confusion. The patient is not nervous/anxious.   All other systems reviewed and are negative.     Medications Medications Ordered Prior to Encounter[1]  Pertinent medications related to GI and procedure were reviewed by me with the patient prior to the procedure  Current Medications[2]  sodium chloride  20 mL/hr at 12/12/24 1211       Allergies[3] Allergies were reviewed by me prior to the procedure  Objective   Body mass index is 28.8 kg/m. Vitals:   12/12/24 1205  BP: 137/83  Pulse: 87  Resp: 20  Temp: (!) 96.8 F (36 C)  TempSrc: Temporal  SpO2: 98%  Weight: 88.5 kg  Height: 5' 9 (1.753 m)     Physical Exam Vitals and nursing note reviewed.  Constitutional:      General: He is not in acute distress.    Appearance: Normal appearance. He is not ill-appearing, toxic-appearing or diaphoretic.  HENT:     Head: Normocephalic and atraumatic.     Nose: Nose normal.     Mouth/Throat:     Mouth: Mucous membranes are moist.     Pharynx: Oropharynx is clear.  Eyes:     General: No scleral icterus.    Extraocular Movements: Extraocular movements intact.  Cardiovascular:     Rate and Rhythm: Normal rate and regular rhythm.     Heart sounds: Normal heart sounds. No murmur heard.    No friction rub. No gallop.  Pulmonary:     Effort: Pulmonary effort is normal. No respiratory distress.     Breath sounds: Normal breath sounds. No wheezing, rhonchi or rales.  Abdominal:     General: Bowel sounds are normal. There is no distension.     Palpations: Abdomen is soft.     Tenderness: There is no abdominal tenderness. There is no guarding or rebound.  Musculoskeletal:     Cervical back: Neck supple.     Right lower leg: No edema.     Left lower leg: No edema.  Skin:    General: Skin is warm and dry.     Coloration: Skin is not jaundiced or pale.  Neurological:     General: No focal deficit present.     Mental Status: He is alert and oriented to person, place, and time. Mental status is at baseline.  Psychiatric:        Mood and Affect: Mood normal.         Behavior: Behavior normal.        Thought Content: Thought content normal.        Judgment: Judgment normal.      Assessment:  [name] is a 53 y.o. male  who presents today for Esophagogastroduodenoscopy and Colonoscopy for Iron deficiency anemia .  Plan:  Esophagogastroduodenoscopy and Colonoscopy with possible intervention today  Esophagogastroduodenoscopy and Colonoscopy with possible biopsy, control of bleeding, polypectomy, and interventions as necessary has been discussed with the patient/patient representative. Informed consent was obtained from the patient/patient representative after explaining the indication, nature, and risks of the procedure including but not limited to death, bleeding, perforation, missed neoplasm/lesions, cardiorespiratory compromise, and reaction to medications. Opportunity for questions was given and appropriate answers were provided. Patient/patient representative has verbalized understanding is amenable to undergoing the procedure.   Elspeth Sharper  Onita, DO  [facility]  Portions of the record may have been created with voice recognition software. Occasional wrong-word or 'sound-a-like' substitutions may have occurred due to the inherent limitations of voice recognition software.  Read the chart carefully and recognize, using context, where substitutions may have occurred.     [1]  No current facility-administered medications on file prior to encounter.   Current Outpatient Medications on File Prior to Encounter  Medication Sig Dispense Refill   albuterol  (VENTOLIN  HFA) 108 (90 Base) MCG/ACT inhaler Inhale 2 puffs into the lungs every 4 (four) hours as needed for wheezing.     budesonide (PULMICORT) 1 MG/2ML nebulizer solution INHALE 1 VIAL VIA NEBULIZER ONCE DAILY     celecoxib  (CELEBREX ) 200 MG capsule Take 1 capsule by mouth 2 (two) times daily.     divalproex  (DEPAKOTE  ER) 250 MG 24 hr tablet Take 250 mg by mouth in  the morning and at bedtime.     DULoxetine  (CYMBALTA ) 20 MG capsule Take 20 mg by mouth at bedtime.     empagliflozin  (JARDIANCE ) 10 MG TABS tablet Take 10 mg by mouth daily.     famotidine  (PEPCID ) 20 MG tablet Take 20 mg by mouth 2 (two) times daily.     FLUoxetine  (PROZAC ) 20 MG capsule Take 20 mg by mouth daily.     Fremanezumab-vfrm 225 MG/1.5ML SOAJ Inject 225 mg into the skin every 30 (thirty) days.     gabapentin  (NEURONTIN ) 300 MG capsule Take 600 mg by mouth 2 (two) times daily.     montelukast  (SINGULAIR ) 10 MG tablet Take 10 mg by mouth at bedtime.     Multiple Vitamin (MULTIVITAMIN WITH MINERALS) TABS tablet Take 1 tablet by mouth daily.     nortriptyline  (PAMELOR ) 50 MG capsule at bedtime. Take 40mg  nightly for one week, then increase to 50mg  nightly     pantoprazole  (PROTONIX ) 40 MG tablet Take 40 mg by mouth 2 (two) times daily.     propranolol  (INDERAL ) 10 MG tablet Take 10 mg by mouth 2 (two) times daily.     rizatriptan (MAXALT) 10 MG tablet May take a second dose after 2 hours if needed.     sitaGLIPtin (JANUVIA) 100 MG tablet Take 100 mg by mouth daily.     tiZANidine  (ZANAFLEX ) 4 MG tablet Take 4 mg by mouth 3 (three) times daily.     traZODone  (DESYREL ) 150 MG tablet Take 150 mg by mouth at bedtime.     zonisamide (ZONEGRAN) 100 MG capsule Take 100 mg by mouth daily.     albuterol  (PROVENTIL ) (2.5 MG/3ML) 0.083% nebulizer solution Take 2.5 mg by nebulization every 4 (four) hours as needed for wheezing.      atorvastatin  (LIPITOR) 40 MG tablet Take 40 mg by mouth at bedtime.     BOTOX 200 units injection Inject 200 Units into the muscle every 3 (three) months.     emtricitabine-tenofovir AF (DESCOVY) 200-25 MG tablet Take 1 tablet by mouth daily.     LIDODERM  5 % Apply 1 patch topically daily.     Melatonin 3 MG TABS Take 3 mg by mouth at bedtime.      methocarbamol  (ROBAXIN ) 750 MG tablet Take 750 mg by mouth 2 (two) times daily. (Patient not taking: Reported on  12/12/2024)     pseudoephedrine -guaifenesin  (MUCINEX  D) 60-600 MG 12 hr tablet Take 1 tablet by mouth every 12 (twelve) hours as needed.     tadalafil  (CIALIS ) 20 MG tablet Take 1  tablet (20 mg total) by mouth daily. 90 tablet 3  [2]  Current Facility-Administered Medications:    0.9 %  sodium chloride  infusion, , Intravenous, Continuous, Onita Elspeth Sharper, DO, Last Rate: 20 mL/hr at 12/12/24 1211, New Bag at 12/12/24 1211 [3]  Allergies Allergen Reactions   Doxycycline Nausea And Vomiting   Benzylparaben (Benzyl P-Hydroxybenzoate) Other (See Comments)    Confirmed by allergy testing Able to wear latex gloves   Dha-Epa-Vitamin E Other (See Comments)    By allergy testing   Ketorolac  Itching    Other reaction(s): Unknown   Omega 3-6-9 Other (See Comments)    By allergy testing   Omega-3     By allergy testing    Other Other (See Comments)    Confirmed by allergy testing Mercaptobenzothiazole Able to wear latex gloves   Parabens Other (See Comments)    Confirmed by allergy testing Able to wear latex gloves    Tramadol Itching and Other (See Comments)    Tolerates extended release Other reaction(s):    Penicillins Hives, Rash and Other (See Comments)    Has patient had a PCN reaction causing immediate rash, facial/tongue/throat swelling, SOB or lightheadedness with hypotension: Yes Has patient had a PCN reaction causing severe rash involving mucus membranes or skin necrosis: No Has patient had a PCN reaction that required hospitalization No Has patient had a PCN reaction occurring within the last 10 years: No If all of the above answers are NO, then may proceed with Cephalosporin use. Other reaction(s):  Can take keflex   "

## 2024-12-12 NOTE — Transfer of Care (Signed)
 Immediate Anesthesia Transfer of Care Note  Patient: Daniel [name]  Procedure(s) Performed: COLONOSCOPY EGD (ESOPHAGOGASTRODUODENOSCOPY) POLYPECTOMY, INTESTINE  Patient Location: PACU  Anesthesia Type:General  Level of Consciousness: sedated  Airway & Oxygen Therapy: Patient Spontanous Breathing  Post-op Assessment: Report given to RN and Post -op Vital signs reviewed and stable  Post vital signs: Reviewed and stable  Last Vitals:  Vitals Value Taken Time  BP 103/79 12/12/24 14:04  Temp    Pulse 88 12/12/24 14:04  Resp 15 12/12/24 14:04  SpO2 97 % 12/12/24 14:04  Vitals shown include unfiled device data.  Last Pain:  Vitals:   12/12/24 1205  TempSrc: Temporal  PainSc: 0-No pain         Complications: No notable events documented.

## 2024-12-12 NOTE — Anesthesia Preprocedure Evaluation (Signed)
 "                                  Anesthesia Evaluation  Patient identified by MRN, date of birth, ID band Patient awake    Reviewed: Allergy & Precautions, NPO status , Patient's Chart, lab work & pertinent test results  Airway Mallampati: II  TM Distance: <3 FB Neck ROM: full    Dental  (+) Missing, Teeth Intact   Pulmonary neg pulmonary ROS, asthma , sleep apnea , Patient abstained from smoking., former smoker   Pulmonary exam normal        Cardiovascular Exercise Tolerance: Good negative cardio ROS Normal cardiovascular exam Rhythm:Regular Rate:Normal     Neuro/Psych  Headaches   Depression Bipolar Disorder   negative neurological ROS  negative psych ROS   GI/Hepatic negative GI ROS, Neg liver ROS,GERD  Medicated,,  Endo/Other  negative endocrine ROSdiabetes, Type 2, Oral Hypoglycemic Agents    Renal/GU negative Renal ROS  negative genitourinary   Musculoskeletal  (+) Arthritis ,    Abdominal Normal abdominal exam  (+)   Peds negative pediatric ROS (+)  Hematology negative hematology ROS (+)   Anesthesia Other Findings Past Medical History: No date: Asthma No date: Bipolar 1 disorder (HCC) No date: Chronic back pain No date: DDD (degenerative disc disease), cervical No date: Depression No date: Diabetes mellitus without complication (HCC) No date: ED (erectile dysfunction) No date: Elevated lipids No date: GERD (gastroesophageal reflux disease) No date: Headache No date: Insomnia No date: Migraine     Comment:  1-2x/month since starting emgality  No date: Seasonal allergies No date: Wears hearing aid in both ears No date: Wheezing  Past Surgical History: No date: CHOLECYSTECTOMY No date: COLONOSCOPY W/ BIOPSIES 03/17/2023: ESOPHAGOGASTRODUODENOSCOPY (EGD) WITH PROPOFOL ; N/A     Comment:  Procedure: ESOPHAGOGASTRODUODENOSCOPY (EGD) WITH               PROPOFOL ;  Surgeon: Maryruth Ole DASEN, MD;  Location:               [facility];  Service: Endoscopy;  Laterality: N/A; 05/13/2023: FOOT ARTHRODESIS; Left     Comment:  Procedure: ARTHRODESIS FOOT;  Surgeon: Ashley Soulier,               DPM;  Location: [facility];  Service: Podiatry;               Laterality: Left;  Diabetic No date: FRACTURE SURGERY     Comment:  tail bone No date: knee arthroscpy; Right No date: PERIPHERAL NERVE STIMULATOR 11/19/2022: POSTERIOR CERVICAL LAMINECTOMY; Right     Comment:  Procedure: RIGHT C5-6 FORAMINOTOMY;  Surgeon: Clois Fret, MD;  Location: [facility];  Service: Neurosurgery;              Laterality: Right; 10/13/2017: SHOULDER ARTHROSCOPY WITH DISTAL CLAVICLE RESECTION; Right     Comment:  Procedure: SHOULDER ARTHROSCOPY WITH DISTAL CLAVICLE               RESECTION;  Surgeon: Edie Norleen PARAS, MD;  Location: [facility]               [facility];  Service: Orthopedics;  Laterality: Right; 10/13/2017: SHOULDER ARTHROSCOPY WITH OPEN ROTATOR CUFF REPAIR; Right     Comment:  Procedure: SHOULDER ARTHROSCOPY WITH OPEN ROTATOR CUFF  REPAIR/;  Surgeon: Edie Norleen PARAS, MD;  Location: [facility]               [facility];  Service: Orthopedics;  Laterality: Right; 12/06/2019: TOTAL KNEE ARTHROPLASTY; Right     Comment:  Procedure: TOTAL KNEE ARTHROPLASTY;  Surgeon: Kathlynn Sharper, MD;  Location: [facility];  Service: Orthopedics;               Laterality: Right; 05/14/2021: TOTAL KNEE ARTHROPLASTY; Right     Comment:  Procedure: REVISION TOTAL KNEE ARTHROPLASTY - [place] Amber to Assist;  Surgeon: Kathlynn Sharper, MD;  Location:              [facility];  Service: Orthopedics;  Laterality: Right;  BMI    Body Mass Index: 28.80 kg/m      Reproductive/Obstetrics negative OB ROS                              Anesthesia Physical Anesthesia Plan  ASA: 2  Anesthesia Plan: General   Post-op Pain Management:    Induction: Intravenous  PONV Risk Score and Plan:  Propofol  infusion and TIVA  Airway Management Planned: Natural Airway and Nasal Cannula  Additional Equipment:   Intra-op Plan:   Post-operative Plan:   Informed Consent: I have reviewed the patients History and Physical, chart, labs and discussed the procedure including the risks, benefits and alternatives for the proposed anesthesia with the patient or authorized representative who has indicated his/her understanding and acceptance.     Dental Advisory Given  Plan Discussed with: Anesthesiologist, CRNA and Surgeon  Anesthesia Plan Comments:         Anesthesia Quick Evaluation  "

## 2024-12-12 NOTE — Interval H&P Note (Signed)
 History and Physical Interval Note: Preprocedure H&P from 12/12/2024  was reviewed and there was no interval change after seeing and examining the patient.  Written consent was obtained from the patient after discussion of risks, benefits, and alternatives. Patient has consented to proceed with Esophagogastroduodenoscopy and Colonoscopy with possible intervention   12/12/2024 1:03 PM  Alm DELENA Beat  has presented today for surgery, with the diagnosis of IDA,.  The various methods of treatment have been discussed with the patient and family. After consideration of risks, benefits and other options for treatment, the patient has consented to  Procedures: COLONOSCOPY (N/A) EGD (ESOPHAGOGASTRODUODENOSCOPY) (N/A) as a surgical intervention.  The patient's history has been reviewed, patient examined, no change in status, stable for surgery.  I have reviewed the patient's chart and labs.  Questions were answered to the patient's satisfaction.     Elspeth Ozell Jungling

## 2024-12-13 ENCOUNTER — Encounter: Admitting: Orthopedic Surgery

## 2024-12-13 LAB — SURGICAL PATHOLOGY

## 2024-12-13 NOTE — Anesthesia Postprocedure Evaluation (Signed)
"   Anesthesia Post Note  Patient: Daniel [name]  Procedure(s) Performed: COLONOSCOPY EGD (ESOPHAGOGASTRODUODENOSCOPY) POLYPECTOMY, INTESTINE BIOPSY, GI  Patient location during evaluation: PACU Anesthesia Type: General Level of consciousness: awake Pain management: satisfactory to patient Vital Signs Assessment: post-procedure vital signs reviewed and stable Respiratory status: spontaneous breathing Cardiovascular status: stable Anesthetic complications: no   No notable events documented.   Last Vitals:  Vitals:   12/12/24 1413 12/12/24 1423  BP: 117/75 (!) 140/78  Pulse: 83 76  Resp: 16 14  Temp:    SpO2: 99% 100%    Last Pain:  Vitals:   12/12/24 1423  TempSrc:   PainSc: 0-No pain                 VAN STAVEREN,Geffrey Michaelsen      "

## 2024-12-28 ENCOUNTER — Encounter: Payer: Self-pay | Admitting: Gastroenterology

## 2024-12-29 ENCOUNTER — Encounter: Payer: Self-pay | Admitting: Gastroenterology

## 2024-12-29 ENCOUNTER — Ambulatory Visit: Admitting: Anesthesiology

## 2024-12-29 ENCOUNTER — Ambulatory Visit
Admission: RE | Admit: 2024-12-29 | Discharge: 2024-12-29 | Disposition: A | Discharge status: Home / Self Care | Attending: Gastroenterology | Admitting: Gastroenterology

## 2024-12-29 ENCOUNTER — Other Ambulatory Visit: Payer: Self-pay

## 2024-12-29 ENCOUNTER — Encounter: Admission: RE | Disposition: A | Payer: Self-pay | Source: Home / Self Care | Attending: Gastroenterology

## 2024-12-29 DIAGNOSIS — Z1211 Encounter for screening for malignant neoplasm of colon: Secondary | ICD-10-CM | POA: Diagnosis present

## 2024-12-29 DIAGNOSIS — E1143 Type 2 diabetes mellitus with diabetic autonomic (poly)neuropathy: Secondary | ICD-10-CM | POA: Insufficient documentation

## 2024-12-29 DIAGNOSIS — I10 Essential (primary) hypertension: Secondary | ICD-10-CM | POA: Insufficient documentation

## 2024-12-29 DIAGNOSIS — K3184 Gastroparesis: Secondary | ICD-10-CM | POA: Insufficient documentation

## 2024-12-29 DIAGNOSIS — K640 First degree hemorrhoids: Secondary | ICD-10-CM | POA: Diagnosis not present

## 2024-12-29 DIAGNOSIS — F319 Bipolar disorder, unspecified: Secondary | ICD-10-CM | POA: Diagnosis not present

## 2024-12-29 DIAGNOSIS — Z800 Family history of malignant neoplasm of digestive organs: Secondary | ICD-10-CM | POA: Diagnosis not present

## 2024-12-29 DIAGNOSIS — J45909 Unspecified asthma, uncomplicated: Secondary | ICD-10-CM | POA: Insufficient documentation

## 2024-12-29 DIAGNOSIS — G4730 Sleep apnea, unspecified: Secondary | ICD-10-CM | POA: Diagnosis not present

## 2024-12-29 DIAGNOSIS — D120 Benign neoplasm of cecum: Secondary | ICD-10-CM | POA: Diagnosis not present

## 2024-12-29 DIAGNOSIS — Z87891 Personal history of nicotine dependence: Secondary | ICD-10-CM | POA: Diagnosis not present

## 2024-12-29 DIAGNOSIS — Z7984 Long term (current) use of oral hypoglycemic drugs: Secondary | ICD-10-CM | POA: Diagnosis not present

## 2024-12-29 DIAGNOSIS — D509 Iron deficiency anemia, unspecified: Secondary | ICD-10-CM | POA: Diagnosis not present

## 2024-12-29 HISTORY — PX: POLYPECTOMY: SHX149

## 2024-12-29 HISTORY — DX: Gastroparesis: K31.84

## 2024-12-29 HISTORY — DX: Essential (primary) hypertension: I10

## 2024-12-29 HISTORY — PX: COLONOSCOPY: SHX5424

## 2024-12-29 LAB — GLUCOSE, CAPILLARY: Glucose-Capillary: 146 mg/dL — ABNORMAL HIGH (ref 70–99)

## 2024-12-29 MED ORDER — LIDOCAINE HCL (CARDIAC) PF 100 MG/5ML IV SOSY
PREFILLED_SYRINGE | INTRAVENOUS | Status: DC | PRN
  Administered 2024-12-29: 60 mg via INTRAVENOUS

## 2024-12-29 MED ORDER — SODIUM CHLORIDE 0.9 % IV SOLN: INTRAVENOUS | Status: DC

## 2024-12-29 MED ORDER — DEXMEDETOMIDINE HCL IN NACL 80 MCG/20ML IV SOLN
INTRAVENOUS | Status: DC | PRN
  Administered 2024-12-29: 12 ug via INTRAVENOUS
  Administered 2024-12-29: 8 ug via INTRAVENOUS

## 2024-12-29 MED ORDER — PROPOFOL 500 MG/50ML IV EMUL
INTRAVENOUS | Status: DC | PRN
  Administered 2024-12-29: 75 ug/kg/min via INTRAVENOUS

## 2024-12-29 MED ORDER — PROPOFOL 10 MG/ML IV BOLUS
INTRAVENOUS | Status: DC | PRN
  Administered 2024-12-29 (×2): 50 mg via INTRAVENOUS

## 2024-12-29 MED ORDER — LIDOCAINE HCL (PF) 2 % IJ SOLN
INTRAMUSCULAR | Status: AC
  Filled 2024-12-29: qty 5

## 2024-12-29 NOTE — Anesthesia Preprocedure Evaluation (Signed)
 "                                  Anesthesia Evaluation  Patient identified by MRN, date of birth, ID band Patient awake    Reviewed: Allergy & Precautions, NPO status , Patient's Chart, lab work & pertinent test results  History of Anesthesia Complications Negative for: history of anesthetic complications  Airway Mallampati: III  TM Distance: >3 FB Neck ROM: full    Dental no notable dental hx.    Pulmonary asthma , sleep apnea , former smoker   Pulmonary exam normal        Cardiovascular hypertension, negative cardio ROS Normal cardiovascular exam     Neuro/Psych  PSYCHIATRIC DISORDERS  Depression Bipolar Disorder    Neuromuscular disease    GI/Hepatic Neg liver ROS,GERD  Medicated,,  Endo/Other  negative endocrine ROSdiabetes    Renal/GU negative Renal ROS  negative genitourinary   Musculoskeletal   Abdominal   Peds  Hematology negative hematology ROS (+)   Anesthesia Other Findings Past Medical History: No date: Asthma No date: Bipolar 1 disorder (HCC) No date: Chronic back pain No date: DDD (degenerative disc disease), cervical No date: Depression No date: Diabetes mellitus without complication (HCC) No date: ED (erectile dysfunction) No date: Elevated lipids No date: Gastroparesis No date: GERD (gastroesophageal reflux disease) No date: Headache No date: Hypertension No date: Insomnia No date: Migraine     Comment:  1-2x/month since starting emgality  No date: Seasonal allergies No date: Wears hearing aid in both ears No date: Wheezing  Past Surgical History: 12/12/2024: BIOPSY OF SKIN SUBCUTANEOUS TISSUE AND/OR MUCOUS MEMBRANE     Comment:  Procedure: BIOPSY, GI;  Surgeon: Onita Elspeth Sharper,               DO;  Location: [facility];  Service:               Gastroenterology;; No date: CHOLECYSTECTOMY 12/12/2024: COLONOSCOPY; N/A     Comment:  Procedure: COLONOSCOPY;  Surgeon: Onita Elspeth Sharper,              DO;   Location: [facility];  Service:               Gastroenterology;  Laterality: N/A; No date: COLONOSCOPY W/ BIOPSIES 12/12/2024: ESOPHAGOGASTRODUODENOSCOPY; N/A     Comment:  Procedure: EGD (ESOPHAGOGASTRODUODENOSCOPY);  Surgeon:               Onita Elspeth Sharper, DO;  Location: [facility];                Service: Gastroenterology;  Laterality: N/A; 03/17/2023: ESOPHAGOGASTRODUODENOSCOPY (EGD) WITH PROPOFOL ; N/A     Comment:  Procedure: ESOPHAGOGASTRODUODENOSCOPY (EGD) WITH               PROPOFOL ;  Surgeon: Maryruth Ole DASEN, MD;  Location:               [facility];  Service: Endoscopy;  Laterality: N/A; 05/13/2023: FOOT ARTHRODESIS; Left     Comment:  Procedure: ARTHRODESIS FOOT;  Surgeon: Ashley Soulier,               DPM;  Location: [facility];  Service: Podiatry;               Laterality: Left;  Diabetic No date: FRACTURE SURGERY     Comment:  tail bone No date: knee arthroscpy; Right No date: PERIPHERAL  NERVE STIMULATOR 12/12/2024: POLYPECTOMY     Comment:  Procedure: POLYPECTOMY, INTESTINE;  Surgeon: Onita Elspeth Sharper, DO;  Location: [facility];  Service:               Gastroenterology;; 11/19/2022: POSTERIOR CERVICAL LAMINECTOMY; Right     Comment:  Procedure: RIGHT C5-6 FORAMINOTOMY;  Surgeon: Clois Fret, MD;  Location: [facility];  Service: Neurosurgery;              Laterality: Right; No date: S/P insertion of spinal cord stimulator - N 10/13/2017: SHOULDER ARTHROSCOPY WITH DISTAL CLAVICLE RESECTION; Right     Comment:  Procedure: SHOULDER ARTHROSCOPY WITH DISTAL CLAVICLE               RESECTION;  Surgeon: Edie Norleen PARAS, MD;  Location: [facility]               ORS;  Service: Orthopedics;  Laterality: Right; 10/13/2017: SHOULDER ARTHROSCOPY WITH OPEN ROTATOR CUFF REPAIR; Right     Comment:  Procedure: SHOULDER ARTHROSCOPY WITH OPEN ROTATOR CUFF               REPAIR/;  Surgeon: Edie Norleen PARAS, MD;  Location: [facility]                ORS;  Service: Orthopedics;  Laterality: Right; 12/06/2019: TOTAL KNEE ARTHROPLASTY; Right     Comment:  Procedure: TOTAL KNEE ARTHROPLASTY;  Surgeon: Kathlynn Sharper, MD;  Location: [facility];  Service: Orthopedics;               Laterality: Right; 05/14/2021: TOTAL KNEE ARTHROPLASTY; Right     Comment:  Procedure: REVISION TOTAL KNEE ARTHROPLASTY - [place] Amber to Assist;  Surgeon: Kathlynn Sharper, MD;  Location:              [facility];  Service: Orthopedics;  Laterality: Right;     Reproductive/Obstetrics negative OB ROS                              Anesthesia Physical Anesthesia Plan  ASA: 3  Anesthesia Plan: General   Post-op Pain Management: Minimal or no pain anticipated   Induction: Intravenous  PONV Risk Score and Plan: 1 and Propofol  infusion and TIVA  Airway Management Planned: Natural Airway and Nasal Cannula  Additional Equipment:   Intra-op Plan:   Post-operative Plan:   Informed Consent: I have reviewed the patients History and Physical, chart, labs and discussed the procedure including the risks, benefits and alternatives for the proposed anesthesia with the patient or authorized representative who has indicated his/her understanding and acceptance.     Dental Advisory Given  Plan Discussed with: Anesthesiologist, CRNA and Surgeon  Anesthesia Plan Comments: (Patient consented for risks of anesthesia including but not limited to:  - adverse reactions to medications - risk of airway placement if required - damage to eyes, teeth, lips or other oral mucosa - nerve damage due to positioning  - sore throat or hoarseness - Damage to heart, brain, nerves, lungs, other parts of body or loss of life  Patient voiced understanding and assent.)  Anesthesia Quick Evaluation  "

## 2024-12-29 NOTE — Transfer of Care (Signed)
 Immediate Anesthesia Transfer of Care Note  Patient: Daniel [name]  Procedure(s) Performed: COLONOSCOPY POLYPECTOMY, INTESTINE  Patient Location: PACU  Anesthesia Type:General  Level of Consciousness: sedated  Airway & Oxygen Therapy: Patient Spontanous Breathing  Post-op Assessment: Report given to RN and Post -op Vital signs reviewed and stable  Post vital signs: Reviewed and stable  Last Vitals:  Vitals Value Taken Time  BP 101/67 12/29/24 10:56  Temp    Pulse 80 12/29/24 10:56  Resp 17 12/29/24 10:56  SpO2 99 % 12/29/24 10:56  Vitals shown include unfiled device data.  Last Pain:  Vitals:   12/29/24 1001  TempSrc: Temporal  PainSc: 0-No pain         Complications: No notable events documented.

## 2024-12-29 NOTE — Interval H&P Note (Signed)
 History and Physical Interval Note: Preprocedure H&P from 12/29/24  was reviewed and there was no interval change after seeing and examining the patient.  Written consent was obtained from the patient after discussion of risks, benefits, and alternatives. Patient has consented to proceed with Colonoscopy with possible intervention   12/29/2024 10:15 AM  Alm DELENA Beat  has presented today for surgery, with the diagnosis of Iron deficiency anemia, unspecified iron deficiency anemia type (D50.9) Family history of colon cancer (Z80.0) Personal history of adenomatous and serrated colon polyps (Z86.0101).  The various methods of treatment have been discussed with the patient and family. After consideration of risks, benefits and other options for treatment, the patient has consented to  Procedures: COLONOSCOPY (N/A) as a surgical intervention.  The patient's history has been reviewed, patient examined, no change in status, stable for surgery.  I have reviewed the patient's chart and labs.  Questions were answered to the patient's satisfaction.     Elspeth Ozell Jungling

## 2024-12-29 NOTE — Anesthesia Postprocedure Evaluation (Signed)
"   Anesthesia Post Note  Patient: Daniel [name]  Procedure(s) Performed: COLONOSCOPY POLYPECTOMY, INTESTINE  Patient location during evaluation: PACU Anesthesia Type: General Level of consciousness: awake Pain management: satisfactory to patient Vital Signs Assessment: post-procedure vital signs reviewed and stable Respiratory status: spontaneous breathing Cardiovascular status: stable Anesthetic complications: no   No notable events documented.   Last Vitals:  Vitals:   12/29/24 1106 12/29/24 1115  BP: 107/69 113/80  Pulse: 79 74  Resp: 14 16  Temp:    SpO2: 100% 100%    Last Pain:  Vitals:   12/29/24 1106  TempSrc:   PainSc: 0-No pain                 VAN STAVEREN,Hampton Cost      "

## 2024-12-29 NOTE — H&P (Signed)
 "  Pre-Procedure H&P   Patient ID: Daniel [name] is a 54 y.o. male.  Gastroenterology Provider: Elspeth Ozell Jungling, DO  Referring Provider: Jonette Primmer, PA PCP: [facility]  Date: 12/29/2024  HPI [name] is a 54 y.o. male who presents today for Colonoscopy for Iron deficiency anemia, personal history of colon polyps, family history of colon cancer .  Colonoscopy Recently performed with suboptimal prep.  Granular mucosa was noted on the cecum that was biopsied and returned back as sessile serrated polyp. Additional polyps were removed.   Past Medical History:  Diagnosis Date   Asthma    Bipolar 1 disorder (HCC)    Chronic back pain    DDD (degenerative disc disease), cervical    Depression    Diabetes mellitus without complication (HCC)    ED (erectile dysfunction)    Elevated lipids    Gastroparesis    GERD (gastroesophageal reflux disease)    Headache    Hypertension    Insomnia    Migraine    1-2x/month since starting emgality    Seasonal allergies    Wears hearing aid in both ears    Wheezing     Past Surgical History:  Procedure Laterality Date   BIOPSY OF SKIN SUBCUTANEOUS TISSUE AND/OR MUCOUS MEMBRANE  12/12/2024   Procedure: BIOPSY, GI;  Surgeon: Jungling Elspeth Ozell, DO;  Location: [facility] ENDOSCOPY;  Service: Gastroenterology;;   CHOLECYSTECTOMY     COLONOSCOPY N/A 12/12/2024   Procedure: COLONOSCOPY;  Surgeon: Jungling Elspeth Ozell, DO;  Location: [facility] ENDOSCOPY;  Service: Gastroenterology;  Laterality: N/A;   COLONOSCOPY W/ BIOPSIES     ESOPHAGOGASTRODUODENOSCOPY N/A 12/12/2024   Procedure: EGD (ESOPHAGOGASTRODUODENOSCOPY);  Surgeon: Jungling Elspeth Ozell, DO;  Location: [facility] ENDOSCOPY;  Service: Gastroenterology;  Laterality: N/A;   ESOPHAGOGASTRODUODENOSCOPY (EGD) WITH PROPOFOL  N/A 03/17/2023   Procedure: ESOPHAGOGASTRODUODENOSCOPY (EGD) WITH PROPOFOL ;  Surgeon: Maryruth Ole DASEN, MD;  Location: [facility] ENDOSCOPY;  Service:  Endoscopy;  Laterality: N/A;   FOOT ARTHRODESIS Left 05/13/2023   Procedure: ARTHRODESIS FOOT;  Surgeon: Ashley Soulier, DPM;  Location: [facility];  Service: Podiatry;  Laterality: Left;  Diabetic   FRACTURE SURGERY     tail bone   knee arthroscpy Right    PERIPHERAL NERVE STIMULATOR     POLYPECTOMY  12/12/2024   Procedure: POLYPECTOMY, INTESTINE;  Surgeon: Jungling Elspeth Ozell, DO;  Location: [facility] ENDOSCOPY;  Service: Gastroenterology;;   POSTERIOR CERVICAL LAMINECTOMY Right 11/19/2022   Procedure: RIGHT C5-6 FORAMINOTOMY;  Surgeon: Clois Fret, MD;  Location: [facility] ORS;  Service: Neurosurgery;  Laterality: Right;   S/P insertion of spinal cord stimulator - N     SHOULDER ARTHROSCOPY WITH DISTAL CLAVICLE RESECTION Right 10/13/2017   Procedure: SHOULDER ARTHROSCOPY WITH DISTAL CLAVICLE RESECTION;  Surgeon: Edie Norleen PARAS, MD;  Location: [facility] ORS;  Service: Orthopedics;  Laterality: Right;   SHOULDER ARTHROSCOPY WITH OPEN ROTATOR CUFF REPAIR Right 10/13/2017   Procedure: SHOULDER ARTHROSCOPY WITH OPEN ROTATOR CUFF REPAIR/;  Surgeon: Edie Norleen PARAS, MD;  Location: [facility] ORS;  Service: Orthopedics;  Laterality: Right;   TOTAL KNEE ARTHROPLASTY Right 12/06/2019   Procedure: TOTAL KNEE ARTHROPLASTY;  Surgeon: Kathlynn Ozell, MD;  Location: [facility] ORS;  Service: Orthopedics;  Laterality: Right;   TOTAL KNEE ARTHROPLASTY Right 05/14/2021   Procedure: REVISION TOTAL KNEE ARTHROPLASTY - [place]ford Amber to Assist;  Surgeon: Kathlynn Ozell, MD;  Location: [facility] ORS;  Service: Orthopedics;  Laterality: Right;    Family History Father- crc 27s No h/o GI disease  or malignancy  Review of Systems  Constitutional:  Negative for activity change, appetite change, chills, diaphoresis, fatigue, fever and unexpected weight change.  HENT:  Negative for trouble swallowing and voice change.   Respiratory:  Negative for shortness of breath and wheezing.   Cardiovascular:  Negative for chest pain,  palpitations and leg swelling.  Gastrointestinal:  Negative for abdominal distention, abdominal pain, anal bleeding, blood in stool, constipation, diarrhea, nausea and vomiting.  Musculoskeletal:  Negative for arthralgias and myalgias.  Skin:  Negative for color change and pallor.  Neurological:  Negative for dizziness, syncope and weakness.  Psychiatric/Behavioral:  Negative for confusion. The patient is not nervous/anxious.   All other systems reviewed and are negative.    Medications Medications Ordered Prior to Encounter[1]  Pertinent medications related to GI and procedure were reviewed by me with the patient prior to the procedure  Current Medications[2]  sodium chloride          Allergies[3] Allergies were reviewed by me prior to the procedure  Objective   Body mass index is 28.5 kg/m. Vitals:   12/29/24 1001  BP: 115/78  Pulse: 87  Resp: 14  Temp: (!) 96.7 F (35.9 C)  TempSrc: Temporal  SpO2: 97%  Weight: 87.5 kg     Physical Exam Vitals and nursing note reviewed.  Constitutional:      General: He is not in acute distress.    Appearance: Normal appearance. He is not ill-appearing, toxic-appearing or diaphoretic.  HENT:     Head: Normocephalic and atraumatic.     Nose: Nose normal.     Mouth/Throat:     Mouth: Mucous membranes are moist.     Pharynx: Oropharynx is clear.  Eyes:     General: No scleral icterus.    Extraocular Movements: Extraocular movements intact.  Cardiovascular:     Rate and Rhythm: Normal rate and regular rhythm.     Heart sounds: Normal heart sounds. No murmur heard.    No friction rub. No gallop.  Pulmonary:     Effort: Pulmonary effort is normal. No respiratory distress.     Breath sounds: Normal breath sounds. No wheezing, rhonchi or rales.  Abdominal:     General: Bowel sounds are normal. There is no distension.     Palpations: Abdomen is soft.     Tenderness: There is no abdominal tenderness. There is no guarding or  rebound.  Musculoskeletal:     Cervical back: Neck supple.     Right lower leg: No edema.     Left lower leg: No edema.  Skin:    General: Skin is warm and dry.     Coloration: Skin is not jaundiced or pale.  Neurological:     General: No focal deficit present.     Mental Status: He is alert and oriented to person, place, and time. Mental status is at baseline.  Psychiatric:        Mood and Affect: Mood normal.        Behavior: Behavior normal.        Thought Content: Thought content normal.        Judgment: Judgment normal.      Assessment:  [name] is a 54 y.o. male  who presents today for Colonoscopy for Iron deficiency anemia, personal history of colon polyps, family history of colon cancer .  Plan:  Colonoscopy with possible intervention today  Colonoscopy with possible biopsy, control of bleeding, polypectomy, and interventions as necessary has been  discussed with the patient/patient representative. Informed consent was obtained from the patient/patient representative after explaining the indication, nature, and risks of the procedure including but not limited to death, bleeding, perforation, missed neoplasm/lesions, cardiorespiratory compromise, and reaction to medications. Opportunity for questions was given and appropriate answers were provided. Patient/patient representative has verbalized understanding is amenable to undergoing the procedure.   Elspeth Ozell Jungling, DO  [facility]  Portions of the record may have been created with voice recognition software. Occasional wrong-word or 'sound-a-like' substitutions may have occurred due to the inherent limitations of voice recognition software.  Read the chart carefully and recognize, using context, where substitutions may have occurred.     [1]  No current facility-administered medications on file prior to encounter.   Current Outpatient Medications on File Prior to Encounter  Medication  Sig Dispense Refill   atorvastatin  (LIPITOR) 40 MG tablet Take 40 mg by mouth at bedtime.     celecoxib  (CELEBREX ) 200 MG capsule Take 1 capsule by mouth 2 (two) times daily.     divalproex  (DEPAKOTE  ER) 250 MG 24 hr tablet Take 250 mg by mouth in the morning and at bedtime.     DULoxetine  (CYMBALTA ) 20 MG capsule Take 20 mg by mouth at bedtime.     empagliflozin  (JARDIANCE ) 10 MG TABS tablet Take 10 mg by mouth daily.     emtricitabine-tenofovir AF (DESCOVY) 200-25 MG tablet Take 1 tablet by mouth daily.     famotidine  (PEPCID ) 20 MG tablet Take 20 mg by mouth 2 (two) times daily.     FLUoxetine  (PROZAC ) 20 MG capsule Take 20 mg by mouth daily.     gabapentin  (NEURONTIN ) 300 MG capsule Take 600 mg by mouth 2 (two) times daily.     Melatonin 3 MG TABS Take 3 mg by mouth at bedtime.      montelukast  (SINGULAIR ) 10 MG tablet Take 10 mg by mouth at bedtime.     Multiple Vitamin (MULTIVITAMIN WITH MINERALS) TABS tablet Take 1 tablet by mouth daily.     nortriptyline  (PAMELOR ) 50 MG capsule at bedtime. Take 40mg  nightly for one week, then increase to 50mg  nightly     pantoprazole  (PROTONIX ) 40 MG tablet Take 40 mg by mouth 2 (two) times daily.     propranolol  (INDERAL ) 10 MG tablet Take 10 mg by mouth 2 (two) times daily.     rizatriptan (MAXALT) 10 MG tablet May take a second dose after 2 hours if needed.     sitaGLIPtin (JANUVIA) 100 MG tablet Take 100 mg by mouth daily.     tiZANidine  (ZANAFLEX ) 4 MG tablet Take 4 mg by mouth 3 (three) times daily.     traZODone  (DESYREL ) 150 MG tablet Take 150 mg by mouth at bedtime.     zonisamide (ZONEGRAN) 100 MG capsule Take 100 mg by mouth daily.     albuterol  (PROVENTIL ) (2.5 MG/3ML) 0.083% nebulizer solution Take 2.5 mg by nebulization every 4 (four) hours as needed for wheezing.      albuterol  (VENTOLIN  HFA) 108 (90 Base) MCG/ACT inhaler Inhale 2 puffs into the lungs every 4 (four) hours as needed for wheezing.     BOTOX 200 units injection Inject 200  Units into the muscle every 3 (three) months.     budesonide (PULMICORT) 1 MG/2ML nebulizer solution INHALE 1 VIAL VIA NEBULIZER ONCE DAILY     Fremanezumab-vfrm 225 MG/1.5ML SOAJ Inject 225 mg into the skin every 30 (thirty) days.     LIDODERM  5 %  Apply 1 patch topically daily.     methocarbamol  (ROBAXIN ) 750 MG tablet Take 750 mg by mouth 2 (two) times daily. (Patient not taking: Reported on 12/12/2024)     pseudoephedrine -guaifenesin  (MUCINEX  D) 60-600 MG 12 hr tablet Take 1 tablet by mouth every 12 (twelve) hours as needed.     tadalafil  (CIALIS ) 20 MG tablet Take 1 tablet (20 mg total) by mouth daily. 90 tablet 3  [2]  Current Facility-Administered Medications:    0.9 %  sodium chloride  infusion, , Intravenous, Continuous, Onita Elspeth Sharper, DO [3]  Allergies Allergen Reactions   Doxycycline Nausea And Vomiting   Benzylparaben (Benzyl P-Hydroxybenzoate) Other (See Comments)    Confirmed by allergy testing Able to wear latex gloves   Dha-Epa-Vitamin E Other (See Comments)    By allergy testing   Ketorolac  Itching    Other reaction(s): Unknown   Omega 3-6-9 Other (See Comments)    By allergy testing   Omega-3     By allergy testing    Other Other (See Comments)    Confirmed by allergy testing Mercaptobenzothiazole Able to wear latex gloves   Parabens Other (See Comments)    Confirmed by allergy testing Able to wear latex gloves    Tramadol Itching and Other (See Comments)    Tolerates extended release Other reaction(s):    Penicillins Hives, Rash and Other (See Comments)    Has patient had a PCN reaction causing immediate rash, facial/tongue/throat swelling, SOB or lightheadedness with hypotension: Yes Has patient had a PCN reaction causing severe rash involving mucus membranes or skin necrosis: No Has patient had a PCN reaction that required hospitalization No Has patient had a PCN reaction occurring within the last 10 years: No If all of the above answers are NO,  then may proceed with Cephalosporin use. Other reaction(s):  Can take keflex   "

## 2024-12-29 NOTE — Op Note (Addendum)
 [facility] Patient Name: Daniel [name] Procedure Date: 12/29/2024 10:09 AM MRN: [id number] Account #: [id number] Date of Birth: 27-Jun-1971 Admit Type: Outpatient Age: 54 Room: [facility] ENDO ROOM 1 Gender: Male Note Status: Supervisor Override Instrument Name: Colon Scope [id number] Procedure:             Colonoscopy Indications:           High risk colon cancer surveillance: Personal history                         of colonic polyps, Family history of colon cancer,                         Iron Deficiency ANemia Providers:             Elspeth Ozell Onita ROSALEA, DO Referring MD:          [facility] (Referring MD) Medicines:             Monitored Anesthesia Care Complications:         No immediate complications. Estimated blood loss:                         Minimal. Procedure:             Pre-Anesthesia Assessment:                        - Prior to the procedure, a History and Physical was                         performed, and patient medications and allergies were                         reviewed. The patient is competent. The risks and                         benefits of the procedure and the sedation options and                         risks were discussed with the patient. All questions                         were answered and informed consent was obtained.                         Patient identification and proposed procedure were                         verified by the physician, the nurse, the anesthetist                         and the technician in the endoscopy suite. Mental                         Status Examination: alert and oriented. Airway                         Examination: normal oropharyngeal airway and neck  mobility. Respiratory Examination: clear to                         auscultation. CV Examination: RRR, no murmurs, no S3                         or S4. Prophylactic Antibiotics: The patient does not                          require prophylactic antibiotics. Prior                         Anticoagulants: The patient has taken no anticoagulant                         or antiplatelet agents. ASA Grade Assessment: III - A                         patient with severe systemic disease. After reviewing                         the risks and benefits, the patient was deemed in                         satisfactory condition to undergo the procedure. The                         anesthesia plan was to use monitored anesthesia care                         (MAC). Immediately prior to administration of                         medications, the patient was re-assessed for adequacy                         to receive sedatives. The heart rate, respiratory                         rate, oxygen saturations, blood pressure, adequacy of                         pulmonary ventilation, and response to care were                         monitored throughout the procedure. The physical                         status of the patient was re-assessed after the                         procedure.                        After obtaining informed consent, the colonoscope was                         passed under direct vision. Throughout the procedure,  the patient's blood pressure, pulse, and oxygen                         saturations were monitored continuously. The                         Colonoscope was introduced through the anus and                         advanced to the the cecum, identified by appendiceal                         orifice and ileocecal valve. The colonoscopy was                         performed without difficulty. The patient tolerated                         the procedure well. The quality of the bowel                         preparation was evaluated using the BBPS [facility] Bowel                         Preparation Scale) with scores of: Right Colon = 2                          (minor amount of residual staining, small fragments of                         stool and/or opaque liquid, but mucosa seen well),                         Transverse Colon = 2 (minor amount of residual                         staining, small fragments of stool and/or opaque                         liquid, but mucosa seen well) and Left Colon = 2                         (minor amount of residual staining, small fragments of                         stool and/or opaque liquid, but mucosa seen well). The                         total BBPS score equals 6. The quality of the bowel                         preparation was good. The ileocecal valve, appendiceal                         orifice, and rectum were photographed. Findings:      The perianal and digital rectal examinations were normal. Pertinent       negatives  include normal sphincter tone.      Retroflexion in the right colon was performed.      A small amount of semi-solid stool was found in the cecum, interfering       with visualization. Lavage of the area was performed using a moderate       amount, resulting in clearance with good visualization. Estimated blood       loss: none.      A 4 to 5 mm polyp was found in the cecum. The polyp was sessile. Imaging       was performed using white light and narrow band imaging to visualize the       mucosa. The polyp was removed with a cold snare. Resection and retrieval       were complete. extensive time spent evaluating this area based on       previous colonoscopy finding. viewed under nbi.      Non-bleeding internal hemorrhoids were found during retroflexion. The       hemorrhoids were Grade I (internal hemorrhoids that do not prolapse).       Estimated blood loss: none.      The exam was otherwise without abnormality on direct and retroflexion       views. Impression:            - Stool in the cecum.                        - One 4 to 5 mm polyp in the cecum, removed with a                          cold snare. Resected and retrieved.                        - Non-bleeding internal hemorrhoids.                        - The examination was otherwise normal on direct and                         retroflexion views. Recommendation:        - Patient has a contact number available for                         emergencies. The signs and symptoms of potential                         delayed complications were discussed with the patient.                         Return to normal activities tomorrow. Written                         discharge instructions were provided to the patient.                        - Discharge patient to home.                        - Resume previous diet.                        -  Continue present medications.                        - Await pathology results.                        - Repeat colonoscopy for surveillance based on                         pathology results.                        - Return to referring physician as previously                         scheduled.                        - The findings and recommendations were discussed with                         the patient. Procedure Code(s):     --- Professional ---                        [phone], Colonoscopy, flexible; with removal of                         tumor(s), polyp(s), or other lesion(s) by snare                         technique Diagnosis Code(s):     --- Professional ---                        Z86.010, Personal history of colonic polyps                        K64.0, First degree hemorrhoids                        D12.0, Benign neoplasm of cecum CPT copyright 2022 [employer]. All rights reserved. The codes documented in this report are preliminary and upon coder review may  be revised to meet current compliance requirements. Attending Participation:      I personally performed the entire procedure. Elspeth Jungling, DO Elspeth Ozell Jungling DO, DO 12/29/2024 10:55:55  AM This report has been signed electronically. Number of Addenda: 0 Note Initiated On: 12/29/2024 10:09 AM Scope Withdrawal Time: 0 hours 36 minutes 45 seconds  Total Procedure Duration: 0 hours 39 minutes 20 seconds  Estimated Blood Loss:  Estimated blood loss was minimal.      [facility]

## 2024-12-30 ENCOUNTER — Other Ambulatory Visit: Payer: Self-pay | Admitting: Medical Genetics

## 2024-12-30 DIAGNOSIS — Z006 Encounter for examination for normal comparison and control in clinical research program: Secondary | ICD-10-CM

## 2025-01-02 LAB — SURGICAL PATHOLOGY

## 2025-01-03 ENCOUNTER — Encounter: Admitting: Orthopedic Surgery

## 2025-01-05 ENCOUNTER — Other Ambulatory Visit

## 2025-01-05 ENCOUNTER — Encounter: Admitting: Neurosurgery

## 2025-01-05 ENCOUNTER — Encounter: Payer: Self-pay | Admitting: Neurosurgery

## 2025-01-11 ENCOUNTER — Encounter
Admission: RE | Admit: 2025-01-11 | Discharge: 2025-01-11 | Disposition: A | Source: Ambulatory Visit | Attending: Neurosurgery | Admitting: Neurosurgery

## 2025-01-11 ENCOUNTER — Other Ambulatory Visit: Payer: Self-pay

## 2025-01-11 VITALS — BP 125/75 | HR 95 | Resp 14 | Ht 69.0 in | Wt 208.7 lb

## 2025-01-11 DIAGNOSIS — Z01812 Encounter for preprocedural laboratory examination: Secondary | ICD-10-CM

## 2025-01-11 DIAGNOSIS — Z01818 Encounter for other preprocedural examination: Secondary | ICD-10-CM | POA: Insufficient documentation

## 2025-01-11 DIAGNOSIS — E1169 Type 2 diabetes mellitus with other specified complication: Secondary | ICD-10-CM | POA: Insufficient documentation

## 2025-01-11 DIAGNOSIS — Z01810 Encounter for preprocedural cardiovascular examination: Secondary | ICD-10-CM

## 2025-01-11 DIAGNOSIS — M5412 Radiculopathy, cervical region: Secondary | ICD-10-CM | POA: Insufficient documentation

## 2025-01-11 DIAGNOSIS — M4802 Spinal stenosis, cervical region: Secondary | ICD-10-CM | POA: Insufficient documentation

## 2025-01-11 HISTORY — DX: Unspecified chronic gastritis without bleeding: K29.50

## 2025-01-11 HISTORY — DX: Obesity, unspecified: E66.9

## 2025-01-11 HISTORY — DX: Personal history of colon polyps, unspecified: Z86.0100

## 2025-01-11 HISTORY — DX: Type 2 diabetes mellitus without complications: E11.9

## 2025-01-11 HISTORY — DX: Radiculopathy, cervical region: M54.12

## 2025-01-11 HISTORY — DX: Personal history of nicotine dependence: Z87.891

## 2025-01-11 HISTORY — DX: Anemia, unspecified: D64.9

## 2025-01-11 HISTORY — DX: Presence of other specified functional implants: Z96.89

## 2025-01-11 HISTORY — DX: Spinal stenosis, cervical region: M48.02

## 2025-01-11 LAB — TYPE AND SCREEN
ABO/RH(D): A POS
Antibody Screen: NEGATIVE

## 2025-01-11 LAB — URINALYSIS, COMPLETE (UACMP) WITH MICROSCOPIC
Bacteria, UA: NONE SEEN
Bilirubin Urine: NEGATIVE
Glucose, UA: >=500 mg/dL — AB
Hgb urine dipstick: NEGATIVE
Ketones, ur: NEGATIVE mg/dL
Leukocytes,Ua: NEGATIVE
Nitrite: NEGATIVE
Protein, ur: NEGATIVE mg/dL
Specific Gravity, Urine: 1.027 (ref 1.005–1.030)
Squamous Epithelial / HPF: 0 /HPF (ref 0–5)
pH: 6 (ref 5.0–8.0)

## 2025-01-11 LAB — SURGICAL PCR SCREEN
MRSA, PCR: NEGATIVE
Staphylococcus aureus: NEGATIVE

## 2025-01-11 NOTE — Patient Instructions (Addendum)
 Your procedure is scheduled on:01-18-25 Wednesday Report to the Registration Desk on the 1st floor of the [facility].Then proceed to the 2nd floor Surgery Desk To find out your arrival time, please call [phone] between 1PM - 3PM on:01-17-25 Tuesday If your arrival time is 6:00 am, do not arrive before that time as the [facility] entrance doors do not open until 6:00 am.  REMEMBER: Instructions that are not followed completely may result in serious medical risk, up to and including death; or upon the discretion of your surgeon and anesthesiologist your surgery may need to be rescheduled.  Do not eat food after midnight the night before surgery.  No gum chewing or hard candies.  You may however, drink Water up to 2 hours before you are scheduled to arrive for your surgery. Do not drink anything within 2 hours of your scheduled arrival time.  One week prior to surgery:Stop NOW 01-11-25 (Wednesday) Stop ANY OVER THE COUNTER supplements until after surgery (Vitamin C, Cinnamon,  Multivitamin)  Stop Jardiance  3 days prior to surgery-Last dose will be on 01-14-25 Saturday  Stop tadalafil  (CIALIS ) 2 days prior to surgery-Last dose will be on 01-15-25 Sunday  Continue taking all of your other prescription medications up until the day of surgery.  ON THE DAY OF SURGERY ONLY TAKE THESE MEDICATIONS WITH SIPS OF WATER: -divalproex  (DEPAKOTE  ER)  -famotidine  (PEPCID )  -FLUoxetine  (PROZAC )  -gabapentin  (NEURONTIN )  -pantoprazole  (PROTONIX )  -propranolol  (INDERAL )  -zonisamide (ZONEGRAN)   Bring your Albuterol  Inhaler to the hospital  No Alcohol for 24 hours before or after surgery.  No Smoking including e-cigarettes for 24 hours before surgery.  No chewable tobacco products for at least 6 hours before surgery.  No nicotine patches on the day of surgery.  Do not use any recreational drugs for at least a week (preferably 2 weeks) before your surgery.  Please be advised that the  combination of cocaine and anesthesia may have negative outcomes, up to and including death. If you test positive for cocaine, your surgery will be cancelled.  On the morning of surgery brush your teeth with toothpaste and water, you may rinse your mouth with mouthwash if you wish. Do not swallow any toothpaste or mouthwash.  Use CHG Soap as directed on instruction sheet.  Do not wear jewelry, make-up, hairpins, clips or nail polish.  For welded (permanent) jewelry: bracelets, anklets, waist bands, etc.  Please have this removed prior to surgery.  If it is not removed, there is a chance that hospital personnel will need to cut it off on the day of surgery.  Do not wear lotions, powders, or perfumes.   Do not shave body hair from the neck down 48 hours before surgery.  Contact lenses, hearing aids and dentures may not be worn into surgery.  Do not bring valuables to the hospital. [facility] is not responsible for any missing/lost belongings or valuables.   Notify your doctor if there is any change in your medical condition (cold, fever, infection).  Wear comfortable clothing (specific to your surgery type) to the hospital.  After surgery, you can help prevent lung complications by doing breathing exercises.  Take deep breaths and cough every 1-2 hours. Your doctor may order a device called an Incentive Spirometer to help you take deep breaths. When coughing or sneezing, hold a pillow firmly against your incision with both hands. This is called splinting. Doing this helps protect your incision. It also decreases belly discomfort.  If you are  being admitted to the hospital overnight, leave your suitcase in the car. After surgery it may be brought to your room.  In case of increased patient census, it may be necessary for you, the patient, to continue your postoperative care in the Same Day Surgery department.  If you are being discharged the day of surgery, you will not be allowed to  drive home. You will need a responsible individual to drive you home and stay with you for 24 hours after surgery.   If you are taking public transportation, you will need to have a responsible individual with you.  Please call the Pre-admissions Testing Dept. at [phone] if you have any questions about these instructions.  Surgery Visitation Policy:  Patients having surgery or a procedure may have two visitors.  Children under the age of 51 must have an adult with them who is not the patient.    Pre-operative 4 CHG Bath Instructions   You can play a key role in reducing the risk of infection after surgery. Your skin needs to be as free of germs as possible. You can reduce the number of germs on your skin by washing with CHG (chlorhexidine  gluconate) soap before surgery. CHG is an antiseptic soap that kills germs and continues to kill germs even after washing.   DO NOT use if you have an allergy to chlorhexidine /CHG or antibacterial soaps. If your skin becomes reddened or irritated, stop using the CHG and notify one of our RNs at [phone].   Please shower with the CHG soap starting 4 days before surgery using the following schedule:     Please keep in mind the following:  DO NOT shave, including legs and underarms, starting the day of your first shower.   You may shave your face at any point before/day of surgery.  Place clean sheets on your bed the day you start using CHG soap. Use a clean washcloth (not used since being washed) for each shower. DO NOT sleep with pets once you start using the CHG.   CHG Shower Instructions:  If you choose to wash your hair and private area, wash first with your normal shampoo/soap.  After you use shampoo/soap, rinse your hair and body thoroughly to remove shampoo/soap residue.  Turn the water OFF and apply about 3 tablespoons (45 ml) of CHG soap to a CLEAN washcloth.  Apply CHG soap ONLY FROM YOUR NECK DOWN TO YOUR TOES (washing for 3-5  minutes)  DO NOT use CHG soap on face, private areas, open wounds, or sores.  Pay special attention to the area where your surgery is being performed.  If you are having back surgery, having someone wash your back for you may be helpful. Wait 2 minutes after CHG soap is applied, then you may rinse off the CHG soap.  Pat dry with a clean towel  Put on clean clothes/pajamas   If you choose to wear lotion, please use ONLY the CHG-compatible lotions on the back of this paper.     Additional instructions for the day of surgery: DO NOT APPLY any lotions, deodorants, cologne, or perfumes.   Put on clean/comfortable clothes.  Brush your teeth.  Ask your nurse before applying any prescription medications to the skin.      CHG Compatible Lotions   Aveeno Moisturizing lotion  Cetaphil Moisturizing Cream  Cetaphil Moisturizing Lotion  Clairol Herbal Essence Moisturizing Lotion, Dry Skin  Clairol Herbal Essence Moisturizing Lotion, Extra Dry Skin  Clairol Herbal Essence Moisturizing  Lotion, Normal Skin  Curel Age Defying Therapeutic Moisturizing Lotion with Alpha Hydroxy  Curel Extreme Care Body Lotion  Curel Soothing Hands Moisturizing Hand Lotion  Curel Therapeutic Moisturizing Cream, Fragrance-Free  Curel Therapeutic Moisturizing Lotion, Fragrance-Free  Curel Therapeutic Moisturizing Lotion, Original Formula  Eucerin Daily Replenishing Lotion  Eucerin Dry Skin Therapy Plus Alpha Hydroxy Crme  Eucerin Dry Skin Therapy Plus Alpha Hydroxy Lotion  Eucerin Original Crme  Eucerin Original Lotion  Eucerin Plus Crme Eucerin Plus Lotion  Eucerin TriLipid Replenishing Lotion  Keri Anti-Bacterial Hand Lotion  Keri Deep Conditioning Original Lotion Dry Skin Formula Softly Scented  Keri Deep Conditioning Original Lotion, Fragrance Free Sensitive Skin Formula  Keri Lotion Fast Absorbing Fragrance Free Sensitive Skin Formula  Keri Lotion Fast Absorbing Softly Scented Dry Skin Formula  Keri  Original Lotion  Keri Skin Renewal Lotion Keri Silky Smooth Lotion  Keri Silky Smooth Sensitive Skin Lotion  Nivea Body Creamy Conditioning Oil  Nivea Body Extra Enriched Teacher, Adult Education Moisturizing Lotion Nivea Crme  Nivea Skin Firming Lotion  NutraDerm 30 Skin Lotion  NutraDerm Skin Lotion  NutraDerm Therapeutic Skin Cream  NutraDerm Therapeutic Skin Lotion  ProShield Protective Hand Cream  Provon moisturizing lotion     Merchandiser, Retail to address health-related social needs:  [email or web address]

## 2025-01-18 ENCOUNTER — Ambulatory Visit: Admission: RE | Admit: 2025-01-18 | Admitting: Neurosurgery

## 2025-01-18 ENCOUNTER — Encounter: Admission: RE | Payer: Self-pay | Source: Ambulatory Visit

## 2025-01-31 ENCOUNTER — Encounter: Admitting: Neurosurgery

## 2025-01-31 ENCOUNTER — Other Ambulatory Visit

## 2025-01-31 ENCOUNTER — Encounter: Admitting: Orthopedic Surgery

## 2025-02-02 ENCOUNTER — Encounter: Admitting: Neurosurgery

## 2025-02-02 ENCOUNTER — Other Ambulatory Visit

## 2025-02-13 ENCOUNTER — Other Ambulatory Visit

## 2025-02-13 ENCOUNTER — Encounter: Admitting: Orthopedic Surgery

## 2025-03-02 ENCOUNTER — Encounter: Admitting: Neurosurgery

## 2025-03-02 ENCOUNTER — Other Ambulatory Visit

## 2025-03-14 ENCOUNTER — Other Ambulatory Visit

## 2025-03-14 ENCOUNTER — Encounter: Admitting: Neurosurgery

## 2025-03-14 ENCOUNTER — Encounter: Admitting: Orthopedic Surgery

## 2025-03-22 ENCOUNTER — Other Ambulatory Visit

## 2025-03-29 ENCOUNTER — Ambulatory Visit: Admitting: Physician Assistant

## 2025-04-04 ENCOUNTER — Encounter: Admitting: Orthopedic Surgery

## 2025-04-04 ENCOUNTER — Other Ambulatory Visit

## 2025-07-07 ENCOUNTER — Other Ambulatory Visit
